# Patient Record
Sex: Female | Born: 1975 | Race: Black or African American | Hispanic: No
Health system: Midwestern US, Community
[De-identification: ages and names within clinical notes are randomized; demographics above are authoritative.]

## PROBLEM LIST (undated history)

## (undated) DIAGNOSIS — R51 Headache: Secondary | ICD-10-CM

## (undated) DIAGNOSIS — F419 Anxiety disorder, unspecified: Secondary | ICD-10-CM

## (undated) DIAGNOSIS — N83209 Unspecified ovarian cyst, unspecified side: Secondary | ICD-10-CM

## (undated) DIAGNOSIS — G5600 Carpal tunnel syndrome, unspecified upper limb: Secondary | ICD-10-CM

## (undated) DIAGNOSIS — K219 Gastro-esophageal reflux disease without esophagitis: Secondary | ICD-10-CM

## (undated) DIAGNOSIS — I10 Essential (primary) hypertension: Secondary | ICD-10-CM

## (undated) DIAGNOSIS — M9511 Cauliflower ear, right ear: Secondary | ICD-10-CM

## (undated) DIAGNOSIS — N63 Unspecified lump in unspecified breast: Secondary | ICD-10-CM

## (undated) DIAGNOSIS — R519 Headache, unspecified: Secondary | ICD-10-CM

## (undated) DIAGNOSIS — I82412 Acute embolism and thrombosis of left femoral vein: Secondary | ICD-10-CM

## (undated) DIAGNOSIS — R5383 Other fatigue: Secondary | ICD-10-CM

## (undated) DIAGNOSIS — S86811A Strain of other muscle(s) and tendon(s) at lower leg level, right leg, initial encounter: Principal | ICD-10-CM

## (undated) HISTORY — DX: Cauliflower ear, right ear: M95.11

## (undated) HISTORY — DX: Anxiety disorder, unspecified: F41.9

## (undated) HISTORY — PX: NO PAST SURGERIES: SHX2092

## (undated) HISTORY — DX: Essential (primary) hypertension: I10

## (undated) HISTORY — PX: AMPUTATION FINGER: SHX6594

## (undated) HISTORY — DX: Gastro-esophageal reflux disease without esophagitis: K21.9

## (undated) HISTORY — DX: Unspecified ovarian cyst, unspecified side: N83.209

## (undated) HISTORY — DX: Headache, unspecified: R51.9

## (undated) HISTORY — DX: Unspecified lump in unspecified breast: N63.0

## (undated) HISTORY — PX: AMPUTATION TOE: SHX6595

## (undated) HISTORY — DX: Carpal tunnel syndrome, unspecified upper limb: G56.00

## (undated) HISTORY — DX: Headache: R51

## (undated) HISTORY — DX: Acute embolism and thrombosis of left femoral vein: I82.412

---

## 2003-09-25 ENCOUNTER — Emergency Department (HOSPITAL_COMMUNITY): Admission: EM | Admit: 2003-09-25 | Discharge: 2003-09-25 | Payer: Self-pay | Admitting: Emergency Medicine

## 2012-09-07 ENCOUNTER — Ambulatory Visit: Payer: Self-pay | Admitting: Internal Medicine

## 2012-10-03 DIAGNOSIS — Z809 Family history of malignant neoplasm, unspecified: Secondary | ICD-10-CM | POA: Insufficient documentation

## 2012-10-25 ENCOUNTER — Emergency Department: Payer: Self-pay | Admitting: Emergency Medicine

## 2012-10-25 LAB — URINALYSIS, COMPLETE
Blood: NEGATIVE
Glucose,UR: NEGATIVE mg/dL (ref 0–75)
Nitrite: NEGATIVE
Ph: 8 (ref 4.5–8.0)
Protein: NEGATIVE
Specific Gravity: 1.017 (ref 1.003–1.030)
WBC UR: 4 /HPF (ref 0–5)

## 2012-10-25 LAB — COMPREHENSIVE METABOLIC PANEL
Alkaline Phosphatase: 87 U/L (ref 50–136)
Anion Gap: 7 (ref 7–16)
BUN: 13 mg/dL (ref 7–18)
Creatinine: 0.62 mg/dL (ref 0.60–1.30)
EGFR (African American): >60
EGFR (Non-African Amer.): >60
Glucose: 100 mg/dL — ABNORMAL HIGH (ref 65–99)
Potassium: 3.3 mmol/L — ABNORMAL LOW (ref 3.5–5.1)
SGOT(AST): 21 U/L (ref 15–37)
SGPT (ALT): 29 U/L (ref 12–78)
Sodium: 138 mmol/L (ref 136–145)
Total Protein: 7.9 g/dL (ref 6.4–8.2)

## 2012-10-25 LAB — CBC
HCT: 39.3 % (ref 35.0–47.0)
HGB: 12.8 g/dL (ref 12.0–16.0)
MCH: 29.3 pg (ref 26.0–34.0)
MCV: 90 fL (ref 80–100)
Platelet: 175 10*3/uL (ref 150–440)
RBC: 4.37 10*6/uL (ref 3.80–5.20)
WBC: 6.4 10*3/uL (ref 3.6–11.0)

## 2013-06-09 ENCOUNTER — Emergency Department: Payer: Self-pay | Admitting: Emergency Medicine

## 2014-01-03 DIAGNOSIS — G4489 Other headache syndrome: Secondary | ICD-10-CM | POA: Insufficient documentation

## 2014-01-24 ENCOUNTER — Ambulatory Visit: Payer: Self-pay | Admitting: Nurse Practitioner

## 2015-03-03 ENCOUNTER — Emergency Department
Admit: 2015-03-03 | Disposition: A | Discharge status: Home / Self Care | Payer: Self-pay | Admitting: Emergency Medicine

## 2015-07-31 ENCOUNTER — Emergency Department: Payer: No Typology Code available for payment source

## 2015-07-31 ENCOUNTER — Emergency Department
Admission: EM | Admit: 2015-07-31 | Discharge: 2015-07-31 | Disposition: A | Discharge status: Home / Self Care | Payer: No Typology Code available for payment source | Attending: Emergency Medicine | Admitting: Emergency Medicine

## 2015-07-31 ENCOUNTER — Encounter: Payer: Self-pay | Admitting: Emergency Medicine

## 2015-07-31 DIAGNOSIS — Z3202 Encounter for pregnancy test, result negative: Secondary | ICD-10-CM | POA: Diagnosis not present

## 2015-07-31 DIAGNOSIS — S40011A Contusion of right shoulder, initial encounter: Secondary | ICD-10-CM | POA: Insufficient documentation

## 2015-07-31 DIAGNOSIS — S3992XA Unspecified injury of lower back, initial encounter: Secondary | ICD-10-CM | POA: Insufficient documentation

## 2015-07-31 DIAGNOSIS — S4991XA Unspecified injury of right shoulder and upper arm, initial encounter: Secondary | ICD-10-CM | POA: Diagnosis present

## 2015-07-31 DIAGNOSIS — Y998 Other external cause status: Secondary | ICD-10-CM | POA: Diagnosis not present

## 2015-07-31 DIAGNOSIS — M545 Low back pain, unspecified: Secondary | ICD-10-CM

## 2015-07-31 DIAGNOSIS — Y92410 Unspecified street and highway as the place of occurrence of the external cause: Secondary | ICD-10-CM | POA: Insufficient documentation

## 2015-07-31 DIAGNOSIS — Y9389 Activity, other specified: Secondary | ICD-10-CM | POA: Diagnosis not present

## 2015-07-31 DIAGNOSIS — G8911 Acute pain due to trauma: Secondary | ICD-10-CM

## 2015-07-31 LAB — POCT PREGNANCY, URINE: Preg Test, Ur: NEGATIVE

## 2015-07-31 MED ORDER — CYCLOBENZAPRINE HCL 5 MG PO TABS: 5.0000 mg | ORAL_TABLET | Freq: Three times a day (TID) | ORAL | Status: DC | PRN

## 2015-07-31 MED ORDER — IBUPROFEN 800 MG PO TABS: 800.0000 mg | ORAL_TABLET | Freq: Three times a day (TID) | ORAL | Status: DC | PRN
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
## 2015-08-05 ENCOUNTER — Emergency Department
Admission: EM | Admit: 2015-08-05 | Discharge: 2015-08-05 | Disposition: A | Discharge status: Home / Self Care | Payer: No Typology Code available for payment source | Attending: Emergency Medicine | Admitting: Emergency Medicine

## 2015-08-05 ENCOUNTER — Encounter: Payer: Self-pay | Admitting: Emergency Medicine

## 2015-08-05 DIAGNOSIS — S4991XD Unspecified injury of right shoulder and upper arm, subsequent encounter: Secondary | ICD-10-CM | POA: Insufficient documentation

## 2015-08-05 DIAGNOSIS — M25511 Pain in right shoulder: Secondary | ICD-10-CM

## 2015-08-05 MED ORDER — NAPROXEN 500 MG PO TBEC: 500.0000 mg | DELAYED_RELEASE_TABLET | Freq: Two times a day (BID) | ORAL | Status: DC

## 2015-08-05 NOTE — Discharge Instructions (Signed)
Shoulder Pain The shoulder is the joint that connects your arms to your body. The bones that form the shoulder joint include the upper arm bone (humerus), the shoulder blade (scapula), and the collarbone (clavicle). The top of the humerus is shaped like a ball and fits into a rather flat socket on the scapula (glenoid cavity). A combination of muscles and strong, fibrous tissues that connect muscles to bones (tendons) support your shoulder joint and hold the ball in the socket. Small, fluid-filled sacs (bursae) are located in different areas of the joint. They act as cushions between the bones and the overlying soft tissues and help reduce friction between the gliding tendons and the bone as you move your arm. Your shoulder joint allows a wide range of motion in your arm. This range of motion allows you to do things like scratch your back or throw a ball. However, this range of motion also makes your shoulder more prone to pain from overuse and injury. Causes of shoulder pain can originate from both injury and overuse and usually can be grouped in the following four categories:  Redness, swelling, and pain (inflammation) of the tendon (tendinitis) or the bursae (bursitis).  Instability, such as a dislocation of the joint.  Inflammation of the joint (arthritis).  Broken bone (fracture). HOME CARE INSTRUCTIONS   Apply ice to the sore area.  Put ice in a plastic bag.  Place a towel between your skin and the bag.  Leave the ice on for 15-20 minutes, 3-4 times per day for the first 2 days, or as directed by your health care provider.  Stop using cold packs if they do not help with the pain.  If you have a shoulder sling or immobilizer, wear it as long as your caregiver instructs. Only remove it to shower or bathe. Move your arm as little as possible, but keep your hand moving to prevent swelling.  Squeeze a soft ball or foam pad as much as possible to help prevent swelling.  Only take  over-the-counter or prescription medicines for pain, discomfort, or fever as directed by your caregiver. SEEK MEDICAL CARE IF:   Your shoulder pain increases, or new pain develops in your arm, hand, or fingers.  Your hand or fingers become cold and numb.  Your pain is not relieved with medicines. SEEK IMMEDIATE MEDICAL CARE IF:   Your arm, hand, or fingers are numb or tingling.  Your arm, hand, or fingers are significantly swollen or turn white or blue. MAKE SURE YOU:   Understand these instructions.  Will watch your condition.  Will get help right away if you are not doing well or get worse. Document Released: 08/17/2005 Document Revised: 03/24/2014 Document Reviewed: 10/22/2011 [facility] Patient Information 2015 [place], [place]. This information is not intended to replace advice given to you by your health care provider. Make sure you discuss any questions you have with your health care provider.  Take the prescription Naprosyn in place of the previously prescribed ibuprofen. Continue with the Flexeril as needed for muscle spasm. Apply ice to the shoulder to reduce pain and inflammation. Follow with Dr. Sabra Heck for ongoing symptoms.
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
## 2015-09-11 ENCOUNTER — Emergency Department
Admission: EM | Admit: 2015-09-11 | Discharge: 2015-09-11 | Disposition: A | Discharge status: Home / Self Care | Payer: Self-pay | Attending: Student | Admitting: Student

## 2015-09-11 ENCOUNTER — Encounter: Payer: Self-pay | Admitting: Emergency Medicine

## 2015-09-11 DIAGNOSIS — Z791 Long term (current) use of non-steroidal anti-inflammatories (NSAID): Secondary | ICD-10-CM | POA: Insufficient documentation

## 2015-09-11 DIAGNOSIS — M5416 Radiculopathy, lumbar region: Secondary | ICD-10-CM | POA: Insufficient documentation

## 2015-09-11 MED ORDER — CYCLOBENZAPRINE HCL 5 MG PO TABS: 5.0000 mg | ORAL_TABLET | Freq: Three times a day (TID) | ORAL | Status: DC | PRN

## 2015-09-11 MED ORDER — TRAMADOL HCL 50 MG PO TABS: 50.0000 mg | ORAL_TABLET | Freq: Four times a day (QID) | ORAL | Status: DC | PRN

## 2015-09-11 MED ORDER — MELOXICAM 15 MG PO TABS: 15.0000 mg | ORAL_TABLET | Freq: Every day | ORAL | Status: DC

## 2015-09-11 NOTE — Discharge Instructions (Signed)
Back Exercises If you have pain in your back, do these exercises 2-3 times each day or as told by your doctor. When the pain goes away, do the exercises once each day, but repeat the steps more times for each exercise (do more repetitions). If you do not have pain in your back, do these exercises once each day or as told by your doctor. EXERCISES Single Knee to Chest Do these steps 3-5 times in a row for each leg:  Lie on your back on a firm bed or the floor with your legs stretched out.  Bring one knee to your chest.  Hold your knee to your chest by grabbing your knee or thigh.  Pull on your knee until you feel a gentle stretch in your lower back.  Keep doing the stretch for 10-30 seconds.  Slowly let go of your leg and straighten it. Pelvic Tilt Do these steps 5-10 times in a row:  Lie on your back on a firm bed or the floor with your legs stretched out.  Bend your knees so they point up to the ceiling. Your feet should be flat on the floor.  Tighten your lower belly (abdomen) muscles to press your lower back against the floor. This will make your tailbone point up to the ceiling instead of pointing down to your feet or the floor.  Stay in this position for 5-10 seconds while you gently tighten your muscles and breathe evenly. Cat-Cow Do these steps until your lower back bends more easily:  Get on your hands and knees on a firm surface. Keep your hands under your shoulders, and keep your knees under your hips. You may put padding under your knees.  Let your head hang down, and make your tailbone point down to the floor so your lower back is round like the back of a cat.  Stay in this position for 5 seconds.  Slowly lift your head and make your tailbone point up to the ceiling so your back hangs low (sags) like the back of a cow.  Stay in this position for 5 seconds. Press-Ups Do these steps 5-10 times in a row:  Lie on your belly (face-down) on the floor.  Place your  hands near your head, about shoulder-width apart.  While you keep your back relaxed and keep your hips on the floor, slowly straighten your arms to raise the top half of your body and lift your shoulders. Do not use your back muscles. To make yourself more comfortable, you may change where you place your hands.  Stay in this position for 5 seconds.  Slowly return to lying flat on the floor. Bridges Do these steps 10 times in a row:  Lie on your back on a firm surface.  Bend your knees so they point up to the ceiling. Your feet should be flat on the floor.  Tighten your butt muscles and lift your butt off of the floor until your waist is almost as high as your knees. If you do not feel the muscles working in your butt and the back of your thighs, slide your feet 1-2 inches farther away from your butt.  Stay in this position for 3-5 seconds.  Slowly lower your butt to the floor, and let your butt muscles relax. If this exercise is too easy, try doing it with your arms crossed over your chest. Belly Crunches Do these steps 5-10 times in a row:  Lie on your back on a firm bed  or the floor with your legs stretched out.  Bend your knees so they point up to the ceiling. Your feet should be flat on the floor.  Cross your arms over your chest.  Tip your chin a little bit toward your chest but do not bend your neck.  Tighten your belly muscles and slowly raise your chest just enough to lift your shoulder blades a tiny bit off of the floor.  Slowly lower your chest and your head to the floor. Back Lifts Do these steps 5-10 times in a row:  Lie on your belly (face-down) with your arms at your sides, and rest your forehead on the floor.  Tighten the muscles in your legs and your butt.  Slowly lift your chest off of the floor while you keep your hips on the floor. Keep the back of your head in line with the curve in your back. Look at the floor while you do this.  Stay in this position  for 3-5 seconds.  Slowly lower your chest and your face to the floor. GET HELP IF:  Your back pain gets a lot worse when you do an exercise.  Your back pain does not lessen 2 hours after you exercise. If you have any of these problems, stop doing the exercises. Do not do them again unless your doctor says it is okay. GET HELP RIGHT AWAY IF:  You have sudden, very bad back pain. If this happens, stop doing the exercises. Do not do them again unless your doctor says it is okay.   This information is not intended to replace advice given to you by your health care provider. Make sure you discuss any questions you have with your health care provider.   Document Released: 12/10/2010 Document Revised: 07/29/2015 Document Reviewed: 01/01/2015 Elsevier Interactive Patient Education 2016 [employer]  Lumbosacral Radiculopathy Lumbosacral radiculopathy is a condition that involves the spinal nerves and nerve roots in the low back and bottom of the spine. The condition develops when these nerves and nerve roots move out of place or become inflamed and cause symptoms. CAUSES This condition may be caused by:  Pressure from a disk that bulges out of place (herniated disk). A disk is a plate of cartilage that separates bones in the spine.  Disk degeneration.  A narrowing of the bones of the lower back (spinal stenosis).  A tumor.  An infection.  An injury that places sudden pressure on the disks that cushion the bones of your lower spine. RISK FACTORS This condition is more likely to develop in:  Males aged 30-50 years.  Females aged 10-60 years.  People who lift improperly.  People who are overweight or live a sedentary lifestyle.  People who smoke.  People who perform repetitive activities that strain the spine. SYMPTOMS Symptoms of this condition include:  Pain that goes down from the back into the legs (sciatica). This is the most common symptom. The pain may be worse with  sitting, coughing, or sneezing.  Pain and numbness in the arms and legs.  Muscle weakness.  Tingling.  Loss of bladder control or bowel control. DIAGNOSIS This condition is diagnosed with a physical exam and medical history. If the pain is lasting, you may have tests, such as:  MRI scan.  X-ray.  CT scan.  Myelogram.  Nerve conduction study. TREATMENT This condition is often treated with:  Hot packs and ice applied to affected areas.  Stretches to improve flexibility.  Exercises to strengthen back muscles.  Physical therapy.  Pain medicine.  A steroid injection in the spine. In some cases, no treatment is needed. If the condition is long-lasting (chronic), or if symptoms are severe, treatment may involve surgery or lifestyle changes, such as following a weight loss plan. HOME CARE INSTRUCTIONS Medicines  Take medicines only as directed by your health care provider.  Do not drive or operate heavy machinery while taking pain medicine. Injury Care  Apply a heat pack to the injured area as directed by your health care provider.  Apply ice to the affected area:  Put ice in a plastic bag.  Place a towel between your skin and the bag.  Leave the ice on for 20-30 minutes, every 2 hours while you are awake or as needed. Or, leave the ice on for as long as directed by your health care provider. Other Instructions  If you were shown how to do any exercises or stretches, do them as directed by your health care provider.  If your health care provider prescribed a diet or exercise program, follow it as directed.  Keep all follow-up visits as directed by your health care provider. This is important. SEEK MEDICAL CARE IF:  Your pain does not improve over time even when taking pain medicines. SEEK IMMEDIATE MEDICAL CARE IF:  Your develop severe pain.  Your pain suddenly gets worse.  You develop increasing weakness in your legs.  You lose the ability to control  your bladder or bowel.  You have difficulty walking or balancing.  You have a fever.   This information is not intended to replace advice given to you by your health care provider. Make sure you discuss any questions you have with your health care provider.   Document Released: 11/07/2005 Document Revised: 03/24/2015 Document Reviewed: 11/03/2014 Elsevier Interactive Patient Education [employer].

## 2015-09-11 NOTE — ED Notes (Signed)
C/o right hip pain, describes pain as "burning", denies any injury

## 2015-09-11 NOTE — ED Provider Notes (Signed)
CSN: [id number]     Arrival date & time 09/11/15  1631 History   First MD Initiated Contact with Patient 09/11/15 1707     Chief Complaint  Patient presents with  . Hip Pain     (Consider location/radiation/quality/duration/timing/severity/associated sxs/prior Treatment) HPI  For evaluation of lower back pain. Pain is been present for 3 days. No trauma or injury. She points to her right lateral hip and buttocks and states she has had a sharp shooting burning pain going down her right lateral hip for the last 3 days. He is increased with movement and activity. She denies any abdominal pelvic pain or urinary symptoms. She has not been taking any medications for pain. Pain is currently 7 out of 10. No weakness or loss of bowel or bladder symptoms.  History reviewed. No pertinent past medical history. History reviewed. No pertinent past surgical history. No family history on file. Social History  Substance Use Topics  . Smoking status: Never Smoker   . Smokeless tobacco: None  . Alcohol Use: No   OB History    No data available     Review of Systems  Constitutional: Negative for fever, chills, activity change and fatigue.  HENT: Negative for congestion, sinus pressure and sore throat.   Eyes: Negative for visual disturbance.  Respiratory: Negative for cough, chest tightness and shortness of breath.   Cardiovascular: Negative for chest pain and leg swelling.  Gastrointestinal: Negative for nausea, vomiting, abdominal pain and diarrhea.  Genitourinary: Negative for dysuria.  Musculoskeletal: Positive for back pain. Negative for arthralgias and gait problem.  Skin: Negative for rash.  Neurological: Negative for weakness, numbness and headaches.  Hematological: Negative for adenopathy.  Psychiatric/Behavioral: Negative for behavioral problems, confusion and agitation.      Allergies  Review of patient's allergies indicates no known allergies.  Home Medications   Prior to  Admission medications   Medication Sig Start Date End Date Taking? Authorizing Provider  cyclobenzaprine (FLEXERIL) 5 MG tablet Take 1 tablet (5 mg total) by mouth every 8 (eight) hours as needed for muscle spasms. 09/11/15   Duanne Guess, PA-C  cyclobenzaprine (FLEXERIL) 5 MG tablet Take 1 tablet (5 mg total) by mouth every 8 (eight) hours as needed for muscle spasms. 09/11/15   Duanne Guess, PA-C  ibuprofen (ADVIL,MOTRIN) 800 MG tablet Take 1 tablet (800 mg total) by mouth every 8 (eight) hours as needed. 07/31/15   Pierce Crane Beers, PA-C  meloxicam (MOBIC) 15 MG tablet Take 1 tablet (15 mg total) by mouth daily. 09/11/15   Duanne Guess, PA-C  naproxen (EC NAPROSYN) 500 MG EC tablet Take 1 tablet (500 mg total) by mouth 2 (two) times daily with a meal. 08/05/15   Jenise V Bacon Menshew, PA-C  traMADol (ULTRAM) 50 MG tablet Take 1 tablet (50 mg total) by mouth every 6 (six) hours as needed. 09/11/15   Duanne Guess, PA-C   BP 132/74 mmHg  Pulse 111  Temp(Src) 98.8 F (37.1 C) (Oral)  Resp 18  Ht 4\' 11"  (1.499 m)  Wt 160 lb (72.576 kg)  BMI 32.30 kg/m2  SpO2 98%  LMP  (LMP Unknown) Physical Exam  Constitutional: She is oriented to person, place, and time. She appears well-developed and well-nourished. No distress.  HENT:  Head: Normocephalic and atraumatic.  Mouth/Throat: Oropharynx is clear and moist.  Eyes: EOM are normal. Pupils are equal, round, and reactive to light. Right eye exhibits no discharge. Left eye exhibits no discharge.  Neck: Normal range of motion. Neck supple.  Cardiovascular: Normal rate, regular rhythm and intact distal pulses.   Pulmonary/Chest: Effort normal and breath sounds normal. No respiratory distress. She exhibits no tenderness.  Abdominal: Soft. She exhibits no distension. There is no tenderness.  Musculoskeletal:  Examination of the lumbar spine shows patient has no spinous process tenderness. There is mild right paravertebral muscle tenderness.  She is tender in the right SI joint and right sciatic notch. She has full flexion and extension of the lumbar spine with no discomfort. She has full range of motion of the right hip with internal and external rotation. She is able to stand with no antalgic component. She has full strength throughout the right lower extremity with normal patellar and Achilles reflexes. No clonus noted. Positive straight leg raise on the right.  Neurological: She is alert and oriented to person, place, and time. She has normal reflexes.  Skin: Skin is warm and dry.  Psychiatric: She has a normal mood and affect. Her behavior is normal. Thought content normal.    ED Course  Procedures (including critical care time) Labs Review Labs Reviewed - No data to display  Imaging Review No results found. I have personally reviewed and evaluated these images and lab results as part of my medical decision-making.   EKG Interpretation None      MDM   Final diagnoses:  Right lumbar radiculopathy    39 year old female with 3 day history of right sided lumbar radiculitis. No trauma or injury. No spinous process tenderness. Patient is treated for lumbar radiculopathy with meloxicam, Lexapro, tramadol. She will follow-up with orthopedics if no improvement. Return to the ER for any worsening symptoms urgent changes in health.    Duanne Guess, PA-C 09/11/15 1717  Joanne Gavel, MD 09/12/15 [phone]

## 2015-10-02 ENCOUNTER — Encounter: Payer: Self-pay | Admitting: Emergency Medicine

## 2015-10-02 ENCOUNTER — Emergency Department
Admission: EM | Admit: 2015-10-02 | Discharge: 2015-10-02 | Disposition: A | Discharge status: Home / Self Care | Payer: Self-pay | Attending: Emergency Medicine | Admitting: Emergency Medicine

## 2015-10-02 DIAGNOSIS — W228XXA Striking against or struck by other objects, initial encounter: Secondary | ICD-10-CM | POA: Insufficient documentation

## 2015-10-02 DIAGNOSIS — H6091 Unspecified otitis externa, right ear: Secondary | ICD-10-CM | POA: Insufficient documentation

## 2015-10-02 DIAGNOSIS — S00431A Contusion of right ear, initial encounter: Secondary | ICD-10-CM | POA: Insufficient documentation

## 2015-10-02 DIAGNOSIS — Z791 Long term (current) use of non-steroidal anti-inflammatories (NSAID): Secondary | ICD-10-CM | POA: Insufficient documentation

## 2015-10-02 DIAGNOSIS — Y998 Other external cause status: Secondary | ICD-10-CM | POA: Insufficient documentation

## 2015-10-02 DIAGNOSIS — Y9389 Activity, other specified: Secondary | ICD-10-CM | POA: Insufficient documentation

## 2015-10-02 DIAGNOSIS — Y9289 Other specified places as the place of occurrence of the external cause: Secondary | ICD-10-CM | POA: Insufficient documentation

## 2015-10-02 LAB — GLUCOSE, CAPILLARY: GLUCOSE-CAPILLARY: 123 mg/dL — AB (ref 65–99)

## 2015-10-02 MED ORDER — CEPHALEXIN 500 MG PO CAPS: 500.0000 mg | ORAL_CAPSULE | Freq: Four times a day (QID) | ORAL | Status: AC

## 2015-10-02 MED ORDER — NEOMYCIN-COLIST-HC-THONZONIUM 3.3-3-10-0.5 MG/ML OT SUSP: 4.0000 [drp] | Freq: Once | OTIC | Status: DC

## 2015-10-02 MED ORDER — NEOMYCIN-POLYMYXIN-HC 1 % OT SOLN
4.0000 [drp] | OTIC | Status: AC
  Administered 2015-10-02: 4 [drp] via OTIC
  Filled 2015-10-02: qty 10

## 2015-10-02 NOTE — ED Notes (Signed)
Pt verbalizes understanding of discharge instructions. Pt and husband have all personal belongings and reports feeling able to dress ear tomorrow.

## 2015-10-02 NOTE — ED Notes (Signed)
Pt left before prescriptions could be taken into room and signature could be obtained and vitals could not be updated. Pt was aware that prescription was on the way but left ED anyway. Prescription placed at front deck in case pt comes back. Pt was seen ambulating without difficulty and with items to dress ear with tomarrow.

## 2015-10-02 NOTE — Discharge Instructions (Signed)
Otitis Externa Otitis externa is a bacterial or fungal infection of the outer ear canal. This is the area from the eardrum to the outside of the ear. Otitis externa is sometimes called "swimmer's ear." CAUSES  Possible causes of infection include:  Swimming in dirty water.  Moisture remaining in the ear after swimming or bathing.  Mild injury (trauma) to the ear.  Objects stuck in the ear (foreign body).  Cuts or scrapes (abrasions) on the outside of the ear. SIGNS AND SYMPTOMS  The first symptom of infection is often itching in the ear canal. Later signs and symptoms may include swelling and redness of the ear canal, ear pain, and yellowish-white fluid (pus) coming from the ear. The ear pain may be worse when pulling on the earlobe. DIAGNOSIS  Your health care provider will perform a physical exam. A sample of fluid may be taken from the ear and examined for bacteria or fungi. TREATMENT  Antibiotic ear drops are often given for 10 to 14 days. Treatment may also include pain medicine or corticosteroids to reduce itching and swelling. HOME CARE INSTRUCTIONS   Apply antibiotic ear drops to the ear canal as prescribed by your health care provider.  Take medicines only as directed by your health care provider.  If you have diabetes, follow any additional treatment instructions from your health care provider.  Keep all follow-up visits as directed by your health care provider. PREVENTION   Keep your ear dry. Use the corner of a towel to absorb water out of the ear canal after swimming or bathing.  Avoid scratching or putting objects inside your ear. This can damage the ear canal or remove the protective wax that lines the canal. This makes it easier for bacteria and fungi to grow.  Avoid swimming in lakes, polluted water, or poorly chlorinated pools.  You may use ear drops made of rubbing alcohol and vinegar after swimming. Combine equal parts of white vinegar and alcohol in a bottle.  Put 3 or 4 drops into each ear after swimming. SEEK MEDICAL CARE IF:   You have a fever.  Your ear is still red, swollen, painful, or draining pus after 3 days.  Your redness, swelling, or pain gets worse.  You have a severe headache.  You have redness, swelling, pain, or tenderness in the area behind your ear. MAKE SURE YOU:   Understand these instructions.  Will watch your condition.  Will get help right away if you are not doing well or get worse.   This information is not intended to replace advice given to you by your health care provider. Make sure you discuss any questions you have with your health care provider.   Document Released: 11/07/2005 Document Revised: 11/28/2014 Document Reviewed: 11/24/2011 Elsevier Interactive Patient Education 2016 [employer]  Hematoma A hematoma is a collection of blood under the skin, in an organ, in a body space, in a joint space, or in other tissue. The blood can clot to form a lump that you can see and feel. The lump is often firm and may sometimes become sore and tender. Most hematomas get better in a few days to weeks. However, some hematomas may be serious and require medical care. Hematomas can range in size from very small to very large. CAUSES  A hematoma can be caused by a blunt or penetrating injury. It can also be caused by spontaneous leakage from a blood vessel under the skin. Spontaneous leakage from a blood vessel is more likely  to occur in older people, especially those taking blood thinners. Sometimes, a hematoma can develop after certain medical procedures. SIGNS AND SYMPTOMS   A firm lump on the body.  Possible pain and tenderness in the area.  Bruising.Blue, dark blue, purple-red, or yellowish skin may appear at the site of the hematoma if the hematoma is close to the surface of the skin. For hematomas in deeper tissues or body spaces, the signs and symptoms may be subtle. For example, an intra-abdominal hematoma  may cause abdominal pain, weakness, fainting, and shortness of breath. An intracranial hematoma may cause a headache or symptoms such as weakness, trouble speaking, or a change in consciousness. DIAGNOSIS  A hematoma can usually be diagnosed based on your medical history and a physical exam. Imaging tests may be needed if your health care provider suspects a hematoma in deeper tissues or body spaces, such as the abdomen, head, or chest. These tests may include ultrasonography or a CT scan.  TREATMENT  Hematomas usually go away on their own over time. Rarely does the blood need to be drained out of the body. Large hematomas or those that may affect vital organs will sometimes need surgical drainage or monitoring. HOME CARE INSTRUCTIONS   Apply ice to the injured area:   Put ice in a plastic bag.   Place a towel between your skin and the bag.   Leave the ice on for 20 minutes, 2-3 times a day for the first 1 to 2 days.   After the first 2 days, switch to using warm compresses on the hematoma.   Elevate the injured area to help decrease pain and swelling. Wrapping the area with an elastic bandage may also be helpful. Compression helps to reduce swelling and promotes shrinking of the hematoma. Make sure the bandage is not wrapped too tight.   If your hematoma is on a lower extremity and is painful, crutches may be helpful for a couple days.   Only take over-the-counter or prescription medicines as directed by your health care provider. SEEK IMMEDIATE MEDICAL CARE IF:   You have increasing pain, or your pain is not controlled with medicine.   You have a fever.   You have worsening swelling or discoloration.   Your skin over the hematoma breaks or starts bleeding.   Your hematoma is in your chest or abdomen and you have weakness, shortness of breath, or a change in consciousness.  Your hematoma is on your scalp (caused by a fall or injury) and you have a worsening headache or a  change in alertness or consciousness. MAKE SURE YOU:   Understand these instructions.  Will watch your condition.  Will get help right away if you are not doing well or get worse.   This information is not intended to replace advice given to you by your health care provider. Make sure you discuss any questions you have with your health care provider.   Document Released: 06/21/2004 Document Revised: 07/10/2013 Document Reviewed: 04/17/2013 Elsevier Interactive Patient Education [employer].  Please return immediately if condition worsens. Please contact her primary physician or the physician you were given for referral. If you have any specialist physicians involved in her treatment and plan please also contact them. Thank you for using [facility] [facility]. Please take over-the-counter pain medication and contact the your nose and throat physician for follow-up concerning the infection in her right ureter along with the swelling and bruising that she have on the outside part  of your ear.
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
## 2016-05-18 ENCOUNTER — Emergency Department
Admission: EM | Admit: 2016-05-18 | Discharge: 2016-05-18 | Disposition: A | Discharge status: Home / Self Care | Payer: No Typology Code available for payment source | Attending: Emergency Medicine | Admitting: Emergency Medicine

## 2016-05-18 DIAGNOSIS — N390 Urinary tract infection, site not specified: Secondary | ICD-10-CM | POA: Insufficient documentation

## 2016-05-18 LAB — URINALYSIS COMPLETE WITH MICROSCOPIC (ARMC ONLY)
Bilirubin Urine: NEGATIVE
GLUCOSE, UA: NEGATIVE mg/dL
NITRITE: NEGATIVE
Protein, ur: 30 mg/dL — AB
SPECIFIC GRAVITY, URINE: 1.014 (ref 1.005–1.030)
pH: 5 (ref 5.0–8.0)

## 2016-05-18 LAB — POCT PREGNANCY, URINE: Preg Test, Ur: NEGATIVE

## 2016-05-18 MED ORDER — SULFAMETHOXAZOLE-TRIMETHOPRIM 800-160 MG PO TABS: 1.0000 | ORAL_TABLET | Freq: Two times a day (BID) | ORAL | Status: DC

## 2016-05-18 MED ORDER — IBUPROFEN 600 MG PO TABS: 600.0000 mg | ORAL_TABLET | Freq: Three times a day (TID) | ORAL | Status: DC | PRN

## 2016-05-18 NOTE — ED Provider Notes (Signed)
[facility] Emergency Department Provider Note   ____________________________________________  Time seen: Approximately 12:00 PM  I have reviewed the triage vital signs and the nursing notes.   HISTORY  Chief Complaint Hip Pain    HPI Alexandra Montes is a 40 y.o. female is here with complaint of right hip pain that radiates down her right leg for 2-3 days.. Patient states that it is worse with lying down. She denies any recent injury and has not taken any over-the-counter medication. Patient is continue to ambulate without any assistance. Currently she denies any urinary symptoms. She denies any incontinence of bowel or bladder. Currently she rates her pain as 7/10.   History reviewed. No pertinent past medical history.  There are no active problems to display for this patient.   History reviewed. No pertinent past surgical history.  Current Outpatient Rx  Name  Route  Sig  Dispense  Refill  . cyclobenzaprine (FLEXERIL) 5 MG tablet   Oral   Take 1 tablet (5 mg total) by mouth every 8 (eight) hours as needed for muscle spasms.   30 tablet   1   . cyclobenzaprine (FLEXERIL) 5 MG tablet   Oral   Take 1 tablet (5 mg total) by mouth every 8 (eight) hours as needed for muscle spasms.   30 tablet   0   . ibuprofen (ADVIL,MOTRIN) 600 MG tablet   Oral   Take 1 tablet (600 mg total) by mouth every 8 (eight) hours as needed.   30 tablet   0   . sulfamethoxazole-trimethoprim (BACTRIM DS,SEPTRA DS) 800-160 MG tablet   Oral   Take 1 tablet by mouth 2 (two) times daily.   20 tablet   0     Allergies Review of patient's allergies indicates no known allergies.  No family history on file.  Social History Social History  Substance Use Topics  . Smoking status: Never Smoker   . Smokeless tobacco: None  . Alcohol Use: No    Review of Systems Constitutional: No fever/chills Cardiovascular: Denies chest pain. Respiratory: Denies shortness of  breath. Gastrointestinal: No abdominal pain.  No nausea, no vomiting.   Genitourinary: Negative for dysuria. Musculoskeletal: Negative for back pain, positive for right hip pain. Skin: Negative for rash. Neurological: Negative for headaches, focal weakness or numbness.  10-point ROS otherwise negative.  ____________________________________________   PHYSICAL EXAM:  VITAL SIGNS: ED Triage Vitals  Enc Vitals Group     BP --      Pulse --      Resp --      Temp --      Temp src --      SpO2 --      Weight --      Height --      Head Cir --      Peak Flow --      Pain Score --      Pain Loc --      Pain Edu? --      Excl. in [place]? --     Constitutional: Alert and oriented. Well appearing and in no acute distress. Eyes: Conjunctivae are normal. PERRL. EOMI. Head: Atraumatic. Nose: No congestion/rhinnorhea. Neck: No stridor.   Cardiovascular: Normal rate, regular rhythm. Grossly normal heart sounds.  Good peripheral circulation. Respiratory: Normal respiratory effort.  No retractions. Lungs CTAB. Gastrointestinal: Soft and nontender. No distention. Mild right CVA tenderness. Musculoskeletal: On exam when patient was asked to point where in her  right hip she is hurting she actually points to her mid back area. Range of motion is without restriction Neurologic:  Normal speech and language. No gross focal neurologic deficits are appreciated. No gait instability. Skin:  Skin is warm, dry and intact. No rash noted. Psychiatric: Mood and affect are normal. Speech and behavior are normal.  ____________________________________________   LABS (all labs ordered are listed, but only abnormal results are displayed)  Labs Reviewed  URINALYSIS COMPLETEWITH MICROSCOPIC ([place] ONLY) - Abnormal; Notable for the following:    Color, Urine YELLOW (*)    APPearance HAZY (*)    Ketones, ur TRACE (*)    Hgb urine dipstick 2+ (*)    Protein, ur 30 (*)    Leukocytes, UA 2+ (*)    Bacteria,  UA RARE (*)    Squamous Epithelial / LPF 0-5 (*)    All other components within normal limits  POC URINE PREG, ED  POCT PREGNANCY, URINE    PROCEDURES  Procedure(s) performed: None  Critical Care performed: No  ____________________________________________   INITIAL IMPRESSION / ASSESSMENT AND PLAN / ED COURSE  Pertinent labs & imaging results that were available during my care of the patient were reviewed by me and considered in my medical decision making (see chart for details).  Patient was told to follow-up with her primary care doctor if any continued urinary symptoms or hip pain. She was started on Bactrim DS twice a day for 10 days along with ibuprofen if needed for pain. Patient was encouraged to increase fluids. ____________________________________________   FINAL CLINICAL IMPRESSION(S) / ED DIAGNOSES  Final diagnoses:  Acute urinary tract infection      NEW MEDICATIONS STARTED DURING THIS VISIT:  New Prescriptions   IBUPROFEN (ADVIL,MOTRIN) 600 MG TABLET    Take 1 tablet (600 mg total) by mouth every 8 (eight) hours as needed.   SULFAMETHOXAZOLE-TRIMETHOPRIM (BACTRIM DS,SEPTRA DS) 800-160 MG TABLET    Take 1 tablet by mouth 2 (two) times daily.     Note:  This document was prepared using Dragon voice recognition software and may include unintentional dictation errors.    Johnn Hai, PA-C 05/18/16 1357  Johnn Hai, PA-C 05/18/16 1357  Earleen Newport, MD 05/18/16 [phone]

## 2016-05-18 NOTE — ED Notes (Signed)
Patient with right hip pain that radiates down leg. Worse when lying down.

## 2016-05-18 NOTE — Discharge Instructions (Signed)
Increase fluids. Follow-up with your primary care doctor at [facility] if any continued problems or urgent concerns. Begin taking Bactrim DS daily twice a day until completely finished. Ibuprofen as needed for pain.

## 2016-12-26 ENCOUNTER — Emergency Department: Payer: Self-pay

## 2016-12-26 ENCOUNTER — Emergency Department
Admission: EM | Admit: 2016-12-26 | Discharge: 2016-12-26 | Disposition: A | Discharge status: Home / Self Care | Payer: Self-pay | Attending: Emergency Medicine | Admitting: Emergency Medicine

## 2016-12-26 DIAGNOSIS — N83201 Unspecified ovarian cyst, right side: Secondary | ICD-10-CM | POA: Insufficient documentation

## 2016-12-26 DIAGNOSIS — R102 Pelvic and perineal pain: Secondary | ICD-10-CM

## 2016-12-26 DIAGNOSIS — D251 Intramural leiomyoma of uterus: Secondary | ICD-10-CM | POA: Insufficient documentation

## 2016-12-26 DIAGNOSIS — R1032 Left lower quadrant pain: Secondary | ICD-10-CM

## 2016-12-26 DIAGNOSIS — A549 Gonococcal infection, unspecified: Secondary | ICD-10-CM | POA: Insufficient documentation

## 2016-12-26 LAB — CBC WITH DIFFERENTIAL/PLATELET
BASOS PCT: 1 %
Basophils Absolute: 0 10*3/uL (ref 0–0.1)
EOS ABS: 0 10*3/uL (ref 0–0.7)
Eosinophils Relative: 1 %
HCT: 36.8 % (ref 35.0–47.0)
HEMOGLOBIN: 12.3 g/dL (ref 12.0–16.0)
Lymphocytes Relative: 44 %
Lymphs Abs: 2.1 10*3/uL (ref 1.0–3.6)
MCH: 30 pg (ref 26.0–34.0)
MCHC: 33.3 g/dL (ref 32.0–36.0)
MCV: 89.9 fL (ref 80.0–100.0)
MONOS PCT: 8 %
Monocytes Absolute: 0.4 10*3/uL (ref 0.2–0.9)
NEUTROS PCT: 46 %
Neutro Abs: 2.2 10*3/uL (ref 1.4–6.5)
PLATELETS: 173 10*3/uL (ref 150–440)
RBC: 4.09 MIL/uL (ref 3.80–5.20)
RDW: 13 % (ref 11.5–14.5)
WBC: 4.8 10*3/uL (ref 3.6–11.0)

## 2016-12-26 LAB — URINALYSIS, COMPLETE (UACMP) WITH MICROSCOPIC
BILIRUBIN URINE: NEGATIVE
Bacteria, UA: NONE SEEN
Glucose, UA: NEGATIVE mg/dL
HGB URINE DIPSTICK: NEGATIVE
KETONES UR: NEGATIVE mg/dL
Leukocytes, UA: NEGATIVE
NITRITE: NEGATIVE
PROTEIN: NEGATIVE mg/dL
RBC / HPF: NONE SEEN RBC/hpf (ref 0–5)
Specific Gravity, Urine: 1.025 (ref 1.005–1.030)
pH: 6 (ref 5.0–8.0)

## 2016-12-26 LAB — BASIC METABOLIC PANEL
Anion gap: 6 (ref 5–15)
BUN: 11 mg/dL (ref 6–20)
CALCIUM: 8.8 mg/dL — AB (ref 8.9–10.3)
CO2: 28 mmol/L (ref 22–32)
Chloride: 107 mmol/L (ref 101–111)
Creatinine, Ser: 0.67 mg/dL (ref 0.44–1.00)
GFR calc Af Amer: >60 mL/min (ref >60)
Glucose, Bld: 82 mg/dL (ref 65–99)
Potassium: 3.6 mmol/L (ref 3.5–5.1)
SODIUM: 141 mmol/L (ref 135–145)

## 2016-12-26 LAB — POCT PREGNANCY, URINE: Preg Test, Ur: NEGATIVE

## 2016-12-26 LAB — WET PREP, GENITAL
Clue Cells Wet Prep HPF POC: NONE SEEN
Sperm: NONE SEEN
Trich, Wet Prep: NONE SEEN
Yeast Wet Prep HPF POC: NONE SEEN

## 2016-12-26 LAB — CHLAMYDIA/NGC RT PCR (ARMC ONLY)
CHLAMYDIA TR: NOT DETECTED
N gonorrhoeae: DETECTED — AB

## 2016-12-26 LAB — HCG, QUANTITATIVE, PREGNANCY

## 2016-12-26 MED ORDER — HYDROCODONE-ACETAMINOPHEN 5-325 MG PO TABS
1.0000 | ORAL_TABLET | Freq: Once | ORAL | Status: AC
  Administered 2016-12-26: 1 via ORAL
  Filled 2016-12-26: qty 1

## 2016-12-26 MED ORDER — HYDROCODONE-ACETAMINOPHEN 5-325 MG PO TABS: 1.0000 | ORAL_TABLET | Freq: Three times a day (TID) | ORAL | 0 refills | Status: DC | PRN

## 2016-12-26 MED ORDER — CEFTRIAXONE SODIUM 250 MG IJ SOLR
250.0000 mg | Freq: Once | INTRAMUSCULAR | Status: AC
  Administered 2016-12-26: 250 mg via INTRAMUSCULAR
  Filled 2016-12-26: qty 250

## 2016-12-26 MED ORDER — LIDOCAINE HCL (PF) 1 % IJ SOLN
INTRAMUSCULAR | Status: AC
  Administered 2016-12-26: 1 mL
  Filled 2016-12-26: qty 5

## 2016-12-26 MED ORDER — ONDANSETRON 8 MG PO TBDP
8.0000 mg | ORAL_TABLET | Freq: Once | ORAL | Status: AC
  Administered 2016-12-26: 8 mg via ORAL
  Filled 2016-12-26: qty 1

## 2016-12-26 MED ORDER — AZITHROMYCIN 500 MG PO TABS
1000.0000 mg | ORAL_TABLET | Freq: Once | ORAL | Status: AC
  Administered 2016-12-26: 1000 mg via ORAL
  Filled 2016-12-26: qty 2

## 2016-12-26 NOTE — ED Provider Notes (Signed)
[facility] Emergency Department Provider Note ____________________________________________  Time seen: 1218  I have reviewed the triage vital signs and the nursing notes.  HISTORY  Chief Complaint  Abdominal Pain  HPI Alexandra Montes is a 41 y.o. female considered the ED for evaluation of left abdominal pain and nausea with onset yesterday. Patient denies any vomiting, diarrhea, chest pain, or shortness of breath. She describes onset of discomfort during sexual intercourse yesterday. She describes "hearing something pop," while engaged in intercourse. She denies any abnormal vaginal bleeding or vaginal discharge. She reports over the last 2 months her periods have been irregular in duration, lasting only 1-2 days. She denies any history of ovarian cysts, endometriosis, or uterine fibroids. She does comment that she and her boyfriend are trying to get pregnant.  History reviewed. No pertinent past medical history.  There are no active problems to display for this patient.  History reviewed. No pertinent surgical history.  Prior to Admission medications   Medication Sig Start Date End Date Taking? Authorizing Provider  cyclobenzaprine (FLEXERIL) 5 MG tablet Take 1 tablet (5 mg total) by mouth every 8 (eight) hours as needed for muscle spasms. 09/11/15   Duanne Guess, PA-C  cyclobenzaprine (FLEXERIL) 5 MG tablet Take 1 tablet (5 mg total) by mouth every 8 (eight) hours as needed for muscle spasms. 09/11/15   Duanne Guess, PA-C  HYDROcodone-acetaminophen (NORCO) 5-325 MG tablet Take 1 tablet by mouth 3 (three) times daily as needed. 12/26/16   Enora Trillo V Bacon Lennin Osmond, PA-C  ibuprofen (ADVIL,MOTRIN) 600 MG tablet Take 1 tablet (600 mg total) by mouth every 8 (eight) hours as needed. 05/18/16   Johnn Hai, PA-C  sulfamethoxazole-trimethoprim (BACTRIM DS,SEPTRA DS) 800-160 MG tablet Take 1 tablet by mouth 2 (two) times daily. 05/18/16   Johnn Hai, PA-C     Allergies Patient has no known allergies.  No family history on file.  Social History Social History  Substance Use Topics  . Smoking status: Never Smoker  . Smokeless tobacco: Never Used  . Alcohol use No    Review of Systems  Constitutional: Negative for fever. Cardiovascular: Negative for chest pain. Respiratory: Negative for shortness of breath. Gastrointestinal: Negative for abdominal pain, vomiting and diarrhea. Genitourinary: Negative for dysuria. Reports left pelvic pain.  Musculoskeletal: Negative for back pain. Skin: Negative for rash. Neurological: Negative for headaches, focal weakness or numbness. ____________________________________________  PHYSICAL EXAM:  VITAL SIGNS: ED Triage Vitals  Enc Vitals Group     BP 12/26/16 1157 132/75     Pulse Rate 12/26/16 1157 67     Resp 12/26/16 1157 16     Temp 12/26/16 1157 97.5 F (36.4 C)     Temp Source 12/26/16 1157 Oral     SpO2 12/26/16 1157 100 %     Weight 12/26/16 1158 155 lb (70.3 kg)     Height 12/26/16 1158 4\' 11"  (1.499 m)     Head Circumference --      Peak Flow --      Pain Score 12/26/16 1158 6     Pain Loc --      Pain Edu? --      Excl. in [place]? --     Constitutional: Alert and oriented. Well appearing and in no distress. Head: Normocephalic and atraumatic. Cardiovascular: Normal rate, regular rhythm. Normal distal pulses. Respiratory: Normal respiratory effort. No wheezes/rales/rhonchi. Gastrointestinal: Soft and nontender. No distention, Rebound, guarding, or rigidity. No CVA tenderness GU: Normal external  genitalia. Moderate white, thick discharge in the vault. No CMT. No adnexal masses.  Musculoskeletal: Nontender with normal range of motion in all extremities.  Neurologic:  Normal gait without ataxia. Normal speech and language. No gross focal neurologic deficits are appreciated. Skin:  Skin is warm, dry and intact. No rash noted. ____________________________________________   LABS  (pertinent positives/negatives) Labs Reviewed  WET PREP, GENITAL - Abnormal; Notable for the following:       Result Value   WBC, Wet Prep HPF POC FEW (*)    All other components within normal limits  CHLAMYDIA/NGC RT PCR ([facility] ONLY) - Abnormal; Notable for the following:    N gonorrhoeae DETECTED (*)    All other components within normal limits  URINALYSIS, COMPLETE (UACMP) WITH MICROSCOPIC - Abnormal; Notable for the following:    Color, Urine YELLOW (*)    APPearance CLEAR (*)    Squamous Epithelial / LPF 0-5 (*)    All other components within normal limits  BASIC METABOLIC PANEL - Abnormal; Notable for the following:    Calcium 8.8 (*)    All other components within normal limits  CBC WITH DIFFERENTIAL/PLATELET  HCG, QUANTITATIVE, PREGNANCY  POC URINE PREG, ED  POCT PREGNANCY, URINE   ____________________________________________   RADIOLOGY  Pelvic/Transvaginal US  IMPRESSION: Probable fibroid posteriorly and to the left of the uterine fundus measuring 1.4 cm in greatest dimension.  Endometrial stripe thickness is normal. There is a small amount of free fluid in the endometrium.  Complex cystic focus in the right ovary measuring 2.2 x 1.6 x 1.7 cm. This may reflect a hemorrhagic cyst but is a nonspecific finding. Correlation with clinical signs and symptoms and the patient's beta HCG and white blood cell count is needed. If a hemorrhagic cyst is felt most likely, follow-up ultrasound in 8-12 weeks would be useful.  Normal appearance of the left ovary. ____________________________________________  PROCEDURES  Zofran 4 mg ODT Norco 5-325 mg PO ____________________________________________  INITIAL IMPRESSION / ASSESSMENT AND PLAN / ED COURSE  Female patient with left pelvic pain which is improved at the time of this disposition. She reports onset during sexual intercourse. Her ultrasound was significant and in showing a right-sided ovarian cyst and  a small uterine fibroid. Patient's left ovary was unremarkable. Patient's blood labs also reassuringat this time. She will follow-up with [facility] for further evaluation and repeat ultrasound as recommended. Her GC probe is pending at the time of disposition.  ----------------------------------------- 5:26 PM on 12/26/2016 -----------------------------------------  Results of the GC probe are now available. Patient notified via phone to [facility] for treatment. She was just discharged, and is in the lobby.   ____________________________________________  FINAL CLINICAL IMPRESSION(S) / ED DIAGNOSES  Final diagnoses:  Abdominal pain, LLQ  Pelvic pain  Pelvic pain in female  Intramural leiomyoma of uterus  Cyst of right ovary  Gonorrhea      Melvenia Needles, PA-C 12/26/16 1735    Lavonia Drafts, MD 12/27/16 [phone]

## 2016-12-26 NOTE — Discharge Instructions (Signed)
Your exam and labs are normal. Your ultrasound shows an ovarian cyst on the right side. You also have a very small fibroid tumor. You should take the prescription pain medicine as needed. Call [facility] to schedule a follow-up appointment. Return to the ED for worsening pelvic pain or abnormal vaginal bleeding.

## 2016-12-26 NOTE — ED Triage Notes (Signed)
Pt c/o left upper abd pain with nausea since yesterday - pt denies diarrhea - denies chest pain or shortness of breath - she reports that Saturday during sex she "heard something pop" and the pain started the day after

## 2017-01-01 ENCOUNTER — Encounter: Payer: Self-pay | Admitting: Emergency Medicine

## 2017-01-01 ENCOUNTER — Emergency Department
Admission: EM | Admit: 2017-01-01 | Discharge: 2017-01-01 | Disposition: A | Discharge status: Home / Self Care | Payer: Self-pay | Attending: Emergency Medicine | Admitting: Emergency Medicine

## 2017-01-01 DIAGNOSIS — D259 Leiomyoma of uterus, unspecified: Secondary | ICD-10-CM | POA: Insufficient documentation

## 2017-01-01 DIAGNOSIS — Z791 Long term (current) use of non-steroidal anti-inflammatories (NSAID): Secondary | ICD-10-CM | POA: Insufficient documentation

## 2017-01-01 DIAGNOSIS — R109 Unspecified abdominal pain: Secondary | ICD-10-CM

## 2017-01-01 LAB — COMPREHENSIVE METABOLIC PANEL
ALT: 11 U/L — ABNORMAL LOW (ref 14–54)
AST: 16 U/L (ref 15–41)
Albumin: 4 g/dL (ref 3.5–5.0)
Alkaline Phosphatase: 45 U/L (ref 38–126)
Anion gap: 6 (ref 5–15)
BUN: 15 mg/dL (ref 6–20)
CHLORIDE: 107 mmol/L (ref 101–111)
CO2: 28 mmol/L (ref 22–32)
CREATININE: 0.79 mg/dL (ref 0.44–1.00)
Calcium: 9.1 mg/dL (ref 8.9–10.3)
GFR calc Af Amer: >60 mL/min (ref >60)
Glucose, Bld: 95 mg/dL (ref 65–99)
Potassium: 3.4 mmol/L — ABNORMAL LOW (ref 3.5–5.1)
Sodium: 141 mmol/L (ref 135–145)
Total Bilirubin: 1.4 mg/dL — ABNORMAL HIGH (ref 0.3–1.2)
Total Protein: 7.8 g/dL (ref 6.5–8.1)

## 2017-01-01 LAB — URINALYSIS, COMPLETE (UACMP) WITH MICROSCOPIC
Bacteria, UA: NONE SEEN
Bilirubin Urine: NEGATIVE
Glucose, UA: NEGATIVE mg/dL
Hgb urine dipstick: NEGATIVE
Ketones, ur: 5 mg/dL — AB
Leukocytes, UA: NEGATIVE
Nitrite: NEGATIVE
PH: 5 (ref 5.0–8.0)
Protein, ur: NEGATIVE mg/dL
SPECIFIC GRAVITY, URINE: 1.026 (ref 1.005–1.030)

## 2017-01-01 LAB — CBC
HCT: 36.9 % (ref 35.0–47.0)
Hemoglobin: 12.9 g/dL (ref 12.0–16.0)
MCH: 30.4 pg (ref 26.0–34.0)
MCHC: 34.8 g/dL (ref 32.0–36.0)
MCV: 87.4 fL (ref 80.0–100.0)
PLATELETS: 184 10*3/uL (ref 150–440)
RBC: 4.23 MIL/uL (ref 3.80–5.20)
RDW: 13.2 % (ref 11.5–14.5)
WBC: 5.5 10*3/uL (ref 3.6–11.0)

## 2017-01-01 LAB — LIPASE, BLOOD: LIPASE: 29 U/L (ref 11–51)

## 2017-01-01 LAB — POCT PREGNANCY, URINE: Preg Test, Ur: NEGATIVE

## 2017-01-01 MED ORDER — NAPROXEN 500 MG PO TABS: 500.0000 mg | ORAL_TABLET | Freq: Two times a day (BID) | ORAL | 0 refills | Status: DC

## 2017-01-01 MED ORDER — DOXYCYCLINE HYCLATE 100 MG PO CAPS: 100.0000 mg | ORAL_CAPSULE | Freq: Two times a day (BID) | ORAL | 0 refills | Status: DC

## 2017-01-01 NOTE — ED Triage Notes (Signed)
Pt reports left side abd pain started 1 weeks ago. Pt reports pain is getting worse and unrelieved by the Norco that she was given last week. Pt states last week she was diagnosed with gonorrhea and ovarian cyst. Was given treatment for the gonorrhea. Denies vomiting or diarrhea today. Denies fever.

## 2017-01-01 NOTE — ED Notes (Signed)

## 2017-01-01 NOTE — ED Provider Notes (Signed)
[facility] Emergency Department Provider Note  ____________________________________________  Time seen: Approximately 8:16 PM  I have reviewed the triage vital signs and the nursing notes.   HISTORY  Chief Complaint Abdominal Pain    HPI Alexandra Montes is a 41 y.o. female who complains of left-sided abdominal pain is been going on for the past week. She is previously evaluated in the ED about 5 days ago, had a ultrasound transvaginal as well as labs and pelvic exam. Workup was significant for a complex cyst on the right ovary, and left-sided uterine fibroid, and gonorrhea. It with antibiotics at that time for the gonorrhea. Denies fever chills vomiting diarrhea. No constipation. No vaginal discharge or bleeding. No other complaints. No aggravating or alleviating factors. Not affected by eating. Normal oral intake.     History reviewed. No pertinent past medical history.   There are no active problems to display for this patient.    History reviewed. No pertinent surgical history.   Prior to Admission medications   Medication Sig Start Date End Date Taking? Authorizing Provider  HYDROcodone-acetaminophen (NORCO) 5-325 MG tablet Take 1 tablet by mouth 3 (three) times daily as needed. 12/26/16  Yes Jenise V Bacon Menshew, PA-C  cyclobenzaprine (FLEXERIL) 5 MG tablet Take 1 tablet (5 mg total) by mouth every 8 (eight) hours as needed for muscle spasms. Patient not taking: Reported on 01/01/2017 09/11/15   Duanne Guess, PA-C  cyclobenzaprine (FLEXERIL) 5 MG tablet Take 1 tablet (5 mg total) by mouth every 8 (eight) hours as needed for muscle spasms. Patient not taking: Reported on 01/01/2017 09/11/15   Duanne Guess, PA-C  doxycycline (VIBRAMYCIN) 100 MG capsule Take 1 capsule (100 mg total) by mouth 2 (two) times daily. 01/01/17   Carrie Mew, MD  ibuprofen (ADVIL,MOTRIN) 600 MG tablet Take 1 tablet (600 mg total) by mouth every 8 (eight) hours as  needed. Patient not taking: Reported on 01/01/2017 05/18/16   Johnn Hai, PA-C  naproxen (NAPROSYN) 500 MG tablet Take 1 tablet (500 mg total) by mouth 2 (two) times daily with a meal. 01/01/17   Carrie Mew, MD     Allergies Patient has no known allergies.   No family history on file.  Social History Social History  Substance Use Topics  . Smoking status: Never Smoker  . Smokeless tobacco: Never Used  . Alcohol use No    Review of Systems  Constitutional:   No fever or chills.  ENT:   No sore throat. No rhinorrhea. Cardiovascular:   No chest pain. Respiratory:   No dyspnea or cough. Gastrointestinal:   Positive left-sided abdominal pain without vomiting and diarrhea.  Genitourinary:   Negative for dysuria or difficulty urinating. Musculoskeletal:   Negative for focal pain or swelling Neurological:   Negative for headaches 10-point ROS otherwise negative.  ____________________________________________   PHYSICAL EXAM:  VITAL SIGNS: ED Triage Vitals  Enc Vitals Group     BP 01/01/17 1658 111/62     Pulse Rate 01/01/17 1658 66     Resp 01/01/17 1658 20     Temp 01/01/17 1658 98.3 F (36.8 C)     Temp Source 01/01/17 1658 Oral     SpO2 01/01/17 1658 99 %     Weight 01/01/17 1659 155 lb (70.3 kg)     Height 01/01/17 1659 4\' 11"  (1.499 m)     Head Circumference --      Peak Flow --      Pain  Score 01/01/17 1704 8     Pain Loc --      Pain Edu? --      Excl. in [place]? --     Vital signs reviewed, nursing assessments reviewed.   Constitutional:   Alert and oriented. Well appearing and in no distress. Eyes:   No scleral icterus. No conjunctival pallor. PERRL. EOMI.  No nystagmus. ENT   Head:   Normocephalic and atraumatic.   Nose:   No congestion/rhinnorhea. No septal hematoma   Mouth/Throat:   MMM, no pharyngeal erythema. No peritonsillar mass.    Neck:   No stridor. No SubQ emphysema. No meningismus. Hematological/Lymphatic/Immunilogical:    No cervical lymphadenopathy. Cardiovascular:   RRR. Symmetric bilateral radial and DP pulses.  No murmurs.  Respiratory:   Normal respiratory effort without tachypnea nor retractions. Breath sounds are clear and equal bilaterally. No wheezes/rales/rhonchi. Gastrointestinal:   Soft and nontender. Non distended. There is no CVA tenderness.  No rebound, rigidity, or guarding. Genitourinary:   deferred Musculoskeletal:   Nontender with normal range of motion in all extremities. No joint effusions.  No lower extremity tenderness.  No edema. Neurologic:   Normal speech and language.  CN 2-10 normal. Motor grossly intact. No gross focal neurologic deficits are appreciated.  Skin:    Skin is warm, dry and intact. No rash noted.  No petechiae, purpura, or bullae.  ____________________________________________    LABS (pertinent positives/negatives) (all labs ordered are listed, but only abnormal results are displayed) Labs Reviewed  COMPREHENSIVE METABOLIC PANEL - Abnormal; Notable for the following:       Result Value   Potassium 3.4 (*)    ALT 11 (*)    Total Bilirubin 1.4 (*)    All other components within normal limits  URINALYSIS, COMPLETE (UACMP) WITH MICROSCOPIC - Abnormal; Notable for the following:    Color, Urine YELLOW (*)    APPearance CLEAR (*)    Ketones, ur 5 (*)    Squamous Epithelial / LPF 0-5 (*)    All other components within normal limits  LIPASE, BLOOD  CBC  POC URINE PREG, ED  POCT PREGNANCY, URINE   ____________________________________________   EKG    ____________________________________________    RADIOLOGY    ____________________________________________   PROCEDURES Procedures  ____________________________________________   INITIAL IMPRESSION / ASSESSMENT AND PLAN / ED COURSE  Pertinent labs & imaging results that were available during my care of the patient were reviewed by me and considered in my medical decision making (see chart for  details).  Patient presents with persistent left-sided abdominal pain. Vital signs unremarkable, exam benign and reassuring. Labs unremarkable. Given that she was positive for gonorrhea, even though Chlamydia was negative, I'll treat her with doxycycline for possible mild PID. Her exam does not reveal peritonitis at this time and she denies any genitourinary symptoms. Most likely this is from her uterine fibroid. I recommended close follow-up with primary care. Prescribed doxycycline, recommended stool softeners well.       ____________________________________________   FINAL CLINICAL IMPRESSION(S) / ED DIAGNOSES  Final diagnoses:  Uterine leiomyoma, unspecified location  Left lateral abdominal pain      New Prescriptions   DOXYCYCLINE (VIBRAMYCIN) 100 MG CAPSULE    Take 1 capsule (100 mg total) by mouth 2 (two) times daily.   NAPROXEN (NAPROSYN) 500 MG TABLET    Take 1 tablet (500 mg total) by mouth 2 (two) times daily with a meal.     Portions of this note were generated with  [employer]. Dictation errors may occur despite best attempts at proofreading.    Carrie Mew, MD 01/01/17 2020

## 2017-03-27 ENCOUNTER — Encounter: Payer: Self-pay | Admitting: Emergency Medicine

## 2017-03-27 ENCOUNTER — Emergency Department
Admission: EM | Admit: 2017-03-27 | Discharge: 2017-03-27 | Disposition: A | Discharge status: Home / Self Care | Payer: Self-pay | Attending: Emergency Medicine | Admitting: Emergency Medicine

## 2017-03-27 DIAGNOSIS — R1032 Left lower quadrant pain: Secondary | ICD-10-CM

## 2017-03-27 DIAGNOSIS — Z79899 Other long term (current) drug therapy: Secondary | ICD-10-CM | POA: Insufficient documentation

## 2017-03-27 DIAGNOSIS — D259 Leiomyoma of uterus, unspecified: Secondary | ICD-10-CM | POA: Insufficient documentation

## 2017-03-27 LAB — COMPREHENSIVE METABOLIC PANEL
ALBUMIN: 3.9 g/dL (ref 3.5–5.0)
ALK PHOS: 48 U/L (ref 38–126)
ALT: 14 U/L (ref 14–54)
ANION GAP: 5 (ref 5–15)
AST: 18 U/L (ref 15–41)
BUN: 12 mg/dL (ref 6–20)
CALCIUM: 8.8 mg/dL — AB (ref 8.9–10.3)
CHLORIDE: 105 mmol/L (ref 101–111)
CO2: 28 mmol/L (ref 22–32)
Creatinine, Ser: 0.71 mg/dL (ref 0.44–1.00)
GFR calc Af Amer: >60 mL/min (ref >60)
GFR calc non Af Amer: >60 mL/min (ref >60)
GLUCOSE: 88 mg/dL (ref 65–99)
Potassium: 3.5 mmol/L (ref 3.5–5.1)
Sodium: 138 mmol/L (ref 135–145)
Total Bilirubin: 1.1 mg/dL (ref 0.3–1.2)
Total Protein: 7.6 g/dL (ref 6.5–8.1)

## 2017-03-27 LAB — URINALYSIS, COMPLETE (UACMP) WITH MICROSCOPIC
BACTERIA UA: NONE SEEN
Bilirubin Urine: NEGATIVE
Glucose, UA: NEGATIVE mg/dL
Ketones, ur: 5 mg/dL — AB
Leukocytes, UA: NEGATIVE
Nitrite: NEGATIVE
PH: 5 (ref 5.0–8.0)
Protein, ur: NEGATIVE mg/dL
Specific Gravity, Urine: 1.02 (ref 1.005–1.030)

## 2017-03-27 LAB — CBC
HCT: 37.8 % (ref 35.0–47.0)
HEMOGLOBIN: 12.5 g/dL (ref 12.0–16.0)
MCH: 29.2 pg (ref 26.0–34.0)
MCHC: 33.1 g/dL (ref 32.0–36.0)
MCV: 88.3 fL (ref 80.0–100.0)
Platelets: 167 10*3/uL (ref 150–440)
RBC: 4.28 MIL/uL (ref 3.80–5.20)
RDW: 12.8 % (ref 11.5–14.5)
WBC: 2.9 10*3/uL — ABNORMAL LOW (ref 3.6–11.0)

## 2017-03-27 LAB — LIPASE, BLOOD: LIPASE: 20 U/L (ref 11–51)

## 2017-03-27 LAB — POCT PREGNANCY, URINE: PREG TEST UR: NEGATIVE

## 2017-03-27 MED ORDER — DICYCLOMINE HCL 20 MG PO TABS: 20.0000 mg | ORAL_TABLET | Freq: Three times a day (TID) | ORAL | 0 refills | Status: DC | PRN

## 2017-03-27 MED ORDER — NAPROXEN 500 MG PO TABS: 500.0000 mg | ORAL_TABLET | Freq: Two times a day (BID) | ORAL | 0 refills | Status: DC

## 2017-03-27 NOTE — ED Provider Notes (Signed)
[facility] Emergency Department Provider Note  ____________________________________________  Time seen: Approximately 7:26 PM  I have reviewed the triage vital signs and the nursing notes.   HISTORY  Chief Complaint Flank Pain    HPI MELI FALEY is a 41 y.o. female who complains of left lower quadrant abdominal pain that has been on for the past 2 months, worse today. She was supposed to follow up with gynecology due to an ovarian cyst and a "tumor" on her uterus, but she did not have insurance. She is now starting a new job in [employer], now plans of follow-up soon. Denies any vaginal bleeding or discharge. Last period was 03/12/2017 and lasted a day.  No dysuria frequency urgency. Not positional. No aggravating or alleviating factors. Moderate intensity, constant. Described as a dull ache.  History reviewed. No pertinent past medical history. Uterine fibroids  There are no active problems to display for this patient.    No past surgical history on file. None  Prior to Admission medications   Medication Sig Start Date End Date Taking? Authorizing Provider  cyclobenzaprine (FLEXERIL) 5 MG tablet Take 1 tablet (5 mg total) by mouth every 8 (eight) hours as needed for muscle spasms. Patient not taking: Reported on 01/01/2017 09/11/15   Duanne Guess, PA-C  cyclobenzaprine (FLEXERIL) 5 MG tablet Take 1 tablet (5 mg total) by mouth every 8 (eight) hours as needed for muscle spasms. Patient not taking: Reported on 01/01/2017 09/11/15   Duanne Guess, PA-C  dicyclomine (BENTYL) 20 MG tablet Take 1 tablet (20 mg total) by mouth 3 (three) times daily as needed for spasms. 03/27/17   Carrie Mew, MD  doxycycline (VIBRAMYCIN) 100 MG capsule Take 1 capsule (100 mg total) by mouth 2 (two) times daily. 01/01/17   Carrie Mew, MD  HYDROcodone-acetaminophen [facility]) 5-325 MG tablet Take 1 tablet by mouth 3 (three) times daily as needed. 12/26/16    Menshew, Dannielle Karvonen, PA-C  ibuprofen (ADVIL,MOTRIN) 600 MG tablet Take 1 tablet (600 mg total) by mouth every 8 (eight) hours as needed. Patient not taking: Reported on 01/01/2017 05/18/16   Johnn Hai, PA-C  naproxen (NAPROSYN) 500 MG tablet Take 1 tablet (500 mg total) by mouth 2 (two) times daily with a meal. 01/01/17   Carrie Mew, MD  naproxen (NAPROSYN) 500 MG tablet Take 1 tablet (500 mg total) by mouth 2 (two) times daily with a meal. 03/27/17   Carrie Mew, MD     Allergies Patient has no known allergies.   No family history on file.  Social History Social History  Substance Use Topics  . Smoking status: Never Smoker  . Smokeless tobacco: Never Used  . Alcohol use No    Review of Systems  Constitutional:   No fever or chills.  ENT:   No sore throat. No rhinorrhea. Lymphatic: No swollen glands, No extremity swelling Endocrine: No hot/cold flashes. No significant weight change. No neck swelling. Cardiovascular:   No chest pain or syncope. Respiratory:   No dyspnea or cough. Gastrointestinal:   Positive for left lower quadrant abdominal pain as above without vomiting and diarrhea.  Genitourinary:   Negative for dysuria or difficulty urinating. Musculoskeletal:   Negative for focal pain or swelling Neurological:   Negative for headaches or weakness. All other systems reviewed and are negative except as documented above in ROS and HPI.  ____________________________________________   PHYSICAL EXAM:  VITAL SIGNS: ED Triage Vitals [03/27/17 1304]  Enc Vitals Group  BP 120/80     Pulse Rate 64     Resp 16     Temp 98 F (36.7 C)     Temp Source Oral     SpO2 100 %     Weight 155 lb (70.3 kg)     Height 4\' 11"  (1.499 m)     Head Circumference      Peak Flow      Pain Score 9     Pain Loc      Pain Edu?      Excl. in [place]?     Vital signs reviewed, nursing assessments reviewed.   Constitutional:   Alert and oriented. Well appearing and  in no distress. Eyes:   No scleral icterus. No conjunctival pallor. PERRL. EOMI.  No nystagmus. ENT   Head:   Normocephalic and atraumatic.   Nose:   No congestion/rhinnorhea. No septal hematoma   Mouth/Throat:   MMM, no pharyngeal erythema. No peritonsillar mass.    Neck:   No stridor. No SubQ emphysema. No meningismus. Hematological/Lymphatic/Immunilogical:   No cervical lymphadenopathy. Cardiovascular:   RRR. Symmetric bilateral radial and DP pulses.  No murmurs.  Respiratory:   Normal respiratory effort without tachypnea nor retractions. Breath sounds are clear and equal bilaterally. No wheezes/rales/rhonchi. Gastrointestinal:   Soft and nontender. Non distended. There is no CVA tenderness.  No rebound, rigidity, or guarding. Genitourinary:   deferred Musculoskeletal:   Normal range of motion in all extremities. No joint effusions.  No lower extremity tenderness.  No edema. Neurologic:   Normal speech and language.  CN 2-10 normal. Motor grossly intact. No gross focal neurologic deficits are appreciated.  Skin:    Skin is warm, dry and intact. No rash noted.  No petechiae, purpura, or bullae.  ____________________________________________    LABS (pertinent positives/negatives) (all labs ordered are listed, but only abnormal results are displayed) Labs Reviewed  COMPREHENSIVE METABOLIC PANEL - Abnormal; Notable for the following:       Result Value   Calcium 8.8 (*)    All other components within normal limits  CBC - Abnormal; Notable for the following:    WBC 2.9 (*)    All other components within normal limits  URINALYSIS, COMPLETE (UACMP) WITH MICROSCOPIC - Abnormal; Notable for the following:    Color, Urine YELLOW (*)    APPearance CLEAR (*)    Hgb urine dipstick SMALL (*)    Ketones, ur 5 (*)    Squamous Epithelial / LPF 0-5 (*)    All other components within normal limits  LIPASE, BLOOD  POC URINE PREG, ED  POCT PREGNANCY, URINE    ____________________________________________   EKG    ____________________________________________    RADIOLOGY  No results found.  ____________________________________________   PROCEDURES Procedures  ____________________________________________   INITIAL IMPRESSION / ASSESSMENT AND PLAN / ED COURSE  Pertinent labs & imaging results that were available during my care of the patient were reviewed by me and considered in my medical decision making (see chart for details).  Patient well appearing no acute distress. Vital signs are normal, exam is unremarkable and reassuring, labs are unremarkable.Considering the patient's symptoms, medical history, and physical examination today, I have low suspicion for cholecystitis or biliary pathology, pancreatitis, perforation or bowel obstruction, hernia, intra-abdominal abscess, AAA or dissection, volvulus or intussusception, mesenteric ischemia, or appendicitis.  The suspicion for STI or PID TOA or torsion. Not pregnant. Follow-up with gynecology for further assessment given her likely symptomatic fibroids. Previous finding of  a complex ovarian cyst which was 2 cm, relatively small, was on the right and is not consistent with her current pain.        ____________________________________________   FINAL CLINICAL IMPRESSION(S) / ED DIAGNOSES  Final diagnoses:  Uterine leiomyoma, unspecified location  LLQ pain      New Prescriptions   DICYCLOMINE (BENTYL) 20 MG TABLET    Take 1 tablet (20 mg total) by mouth 3 (three) times daily as needed for spasms.   NAPROXEN (NAPROSYN) 500 MG TABLET    Take 1 tablet (500 mg total) by mouth 2 (two) times daily with a meal.     Portions of this note were generated with dragon dictation software. Dictation errors may occur despite best attempts at proofreading.    Carrie Mew, MD 03/27/17 1929

## 2017-03-27 NOTE — ED Triage Notes (Signed)
Pt reports intermittent left flank pain x2 months, reports pain woke her up from sleep at 0300. Pt reports taking doxycycline and naprosen without relief.

## 2017-03-27 NOTE — ED Notes (Signed)
Patient updated on plan of care.  This RN apologized for delays.

## 2017-04-03 ENCOUNTER — Emergency Department: Payer: Self-pay

## 2017-04-03 ENCOUNTER — Encounter: Payer: Self-pay | Admitting: Emergency Medicine

## 2017-04-03 ENCOUNTER — Emergency Department
Admission: EM | Admit: 2017-04-03 | Discharge: 2017-04-03 | Disposition: A | Discharge status: Home / Self Care | Payer: Self-pay | Attending: Emergency Medicine | Admitting: Emergency Medicine

## 2017-04-03 DIAGNOSIS — R1013 Epigastric pain: Secondary | ICD-10-CM | POA: Insufficient documentation

## 2017-04-03 LAB — LIPASE, BLOOD: Lipase: 23 U/L (ref 11–51)

## 2017-04-03 LAB — URINALYSIS, COMPLETE (UACMP) WITH MICROSCOPIC
BILIRUBIN URINE: NEGATIVE
Bacteria, UA: NONE SEEN
GLUCOSE, UA: NEGATIVE mg/dL
HGB URINE DIPSTICK: NEGATIVE
Ketones, ur: NEGATIVE mg/dL
LEUKOCYTES UA: NEGATIVE
NITRITE: NEGATIVE
PH: 7 (ref 5.0–8.0)
PROTEIN: NEGATIVE mg/dL
Specific Gravity, Urine: 1.012 (ref 1.005–1.030)
WBC, UA: NONE SEEN WBC/hpf (ref 0–5)

## 2017-04-03 LAB — COMPREHENSIVE METABOLIC PANEL
ALBUMIN: 3.6 g/dL (ref 3.5–5.0)
ALT: 35 U/L (ref 14–54)
AST: 54 U/L — AB (ref 15–41)
Alkaline Phosphatase: 43 U/L (ref 38–126)
Anion gap: 4 — ABNORMAL LOW (ref 5–15)
BILIRUBIN TOTAL: 0.7 mg/dL (ref 0.3–1.2)
BUN: 23 mg/dL — AB (ref 6–20)
CO2: 27 mmol/L (ref 22–32)
CREATININE: 0.73 mg/dL (ref 0.44–1.00)
Calcium: 8.7 mg/dL — ABNORMAL LOW (ref 8.9–10.3)
Chloride: 108 mmol/L (ref 101–111)
GFR calc Af Amer: >60 mL/min (ref >60)
GFR calc non Af Amer: >60 mL/min (ref >60)
GLUCOSE: 91 mg/dL (ref 65–99)
POTASSIUM: 4.6 mmol/L (ref 3.5–5.1)
Sodium: 139 mmol/L (ref 135–145)
TOTAL PROTEIN: 7 g/dL (ref 6.5–8.1)

## 2017-04-03 LAB — CBC
HEMATOCRIT: 35.2 % (ref 35.0–47.0)
HEMOGLOBIN: 11.8 g/dL — AB (ref 12.0–16.0)
MCH: 29.4 pg (ref 26.0–34.0)
MCHC: 33.5 g/dL (ref 32.0–36.0)
MCV: 87.7 fL (ref 80.0–100.0)
Platelets: 171 10*3/uL (ref 150–440)
RBC: 4.01 MIL/uL (ref 3.80–5.20)
RDW: 12.9 % (ref 11.5–14.5)
WBC: 4.8 10*3/uL (ref 3.6–11.0)

## 2017-04-03 LAB — POCT PREGNANCY, URINE: Preg Test, Ur: NEGATIVE

## 2017-04-03 MED ORDER — IOPAMIDOL (ISOVUE-300) INJECTION 61%
30.0000 mL | Freq: Once | INTRAVENOUS | Status: AC | PRN
  Administered 2017-04-03: 30 mL via ORAL

## 2017-04-03 MED ORDER — GI COCKTAIL ~~LOC~~
30.0000 mL | Freq: Once | ORAL | Status: AC
  Administered 2017-04-03: 30 mL via ORAL
  Filled 2017-04-03: qty 30

## 2017-04-03 MED ORDER — IOPAMIDOL (ISOVUE-300) INJECTION 61%
100.0000 mL | Freq: Once | INTRAVENOUS | Status: AC | PRN
  Administered 2017-04-03: 100 mL via INTRAVENOUS

## 2017-04-03 NOTE — ED Triage Notes (Signed)
Pt states she continues to have pain in her left upper abd and at times, left lower abd. Pt states she was told she had a cyst on her ovary, however, the pain continues to worsen, states the pain is constant. Vomiting this am, denies diarrhea.

## 2017-04-03 NOTE — ED Provider Notes (Signed)
[facility] Emergency Department Provider Note   ____________________________________________    I have reviewed the triage vital signs and the nursing notes.   HISTORY  Chief Complaint Abdominal Pain     HPI Alexandra Montes is a 41 y.o. female who presents with complaints of abdominal pain. Patient reports she has had this abdominal pain for several months now. She is frustrated that she has had repeat emergency department visits with no definite cause of her pain however she has not followed up with gynecology as asked. She reports mild epigastric burning today which is new as well as left sided abdominal pain which is old. She has barely been taking axis I: And Naprosyn with some relief of her abdominal pain. No fevers or chills reported. Normal stools. No vomiting.   History reviewed. No pertinent past medical history.  There are no active problems to display for this patient.   History reviewed. No pertinent surgical history.  Prior to Admission medications   Medication Sig Start Date End Date Taking? Authorizing Provider  doxycycline (VIBRAMYCIN) 100 MG capsule Take 1 capsule (100 mg total) by mouth 2 (two) times daily. 01/01/17  Yes Carrie Mew, MD  naproxen (NAPROSYN) 500 MG tablet Take 1 tablet (500 mg total) by mouth 2 (two) times daily with a meal. 03/27/17  Yes Carrie Mew, MD  cyclobenzaprine (FLEXERIL) 5 MG tablet Take 1 tablet (5 mg total) by mouth every 8 (eight) hours as needed for muscle spasms. Patient not taking: Reported on 01/01/2017 09/11/15   Duanne Guess, PA-C  cyclobenzaprine (FLEXERIL) 5 MG tablet Take 1 tablet (5 mg total) by mouth every 8 (eight) hours as needed for muscle spasms. Patient not taking: Reported on 01/01/2017 09/11/15   Duanne Guess, PA-C  dicyclomine (BENTYL) 20 MG tablet Take 1 tablet (20 mg total) by mouth 3 (three) times daily as needed for spasms. Patient not taking: Reported on 04/03/2017  03/27/17   Carrie Mew, MD  HYDROcodone-acetaminophen [facility]) 5-325 MG tablet Take 1 tablet by mouth 3 (three) times daily as needed. Patient not taking: Reported on 04/03/2017 12/26/16   Menshew, Dannielle Karvonen, PA-C  ibuprofen (ADVIL,MOTRIN) 600 MG tablet Take 1 tablet (600 mg total) by mouth every 8 (eight) hours as needed. Patient not taking: Reported on 01/01/2017 05/18/16   Johnn Hai, PA-C     Allergies Patient has no known allergies.  No family history on file.  Social History Social History  Substance Use Topics  . Smoking status: Never Smoker  . Smokeless tobacco: Never Used  . Alcohol use No    Review of Systems  Constitutional: No fever/chills Eyes: No visual changes.  ENT: No sore throat. Cardiovascular: Denies chest pain. Respiratory: Denies shortness of breath. Gastrointestinal: As above   Genitourinary: Negative for dysuria. Musculoskeletal: Negative for back pain. Skin: Negative for rash. Neurological: Negative for headaches or weakness   ____________________________________________   PHYSICAL EXAM:  VITAL SIGNS: ED Triage Vitals  Enc Vitals Group     BP 04/03/17 0829 119/74     Pulse Rate 04/03/17 0829 65     Resp 04/03/17 0829 18     Temp 04/03/17 0829 98.4 F (36.9 C)     Temp Source 04/03/17 0829 Oral     SpO2 04/03/17 0829 100 %     Weight 04/03/17 0829 155 lb (70.3 kg)     Height 04/03/17 0829 4\' 11"  (1.499 m)     Head Circumference --  Peak Flow --      Pain Score 04/03/17 0830 8     Pain Loc --      Pain Edu? --      Excl. in [place]? --     Constitutional: Alert and oriented. No acute distress. Pleasant and interactive Eyes: Conjunctivae are normal.  Head: Atraumatic. Nose: No congestion/rhinnorhea. Mouth/Throat: Mucous membranes are moist.    Cardiovascular: Normal rate, regular rhythm. Grossly normal heart sounds.  Good peripheral circulation. Respiratory: Normal respiratory effort.  No retractions. Lungs  CTAB. Gastrointestinal: Soft and nontender. No distention.  No CVA tenderness. Genitourinary: deferred Musculoskeletal: No lower extremity tenderness nor edema.  Warm and well perfused Neurologic:  Normal speech and language. No gross focal neurologic deficits are appreciated.  Skin:  Skin is warm, dry and intact. No rash noted. Psychiatric: Mood and affect are normal. Speech and behavior are normal.  ____________________________________________   LABS (all labs ordered are listed, but only abnormal results are displayed)  Labs Reviewed  CBC - Abnormal; Notable for the following:       Result Value   Hemoglobin 11.8 (*)    All other components within normal limits  COMPREHENSIVE METABOLIC PANEL - Abnormal; Notable for the following:    BUN 23 (*)    Calcium 8.7 (*)    AST 54 (*)    Anion gap 4 (*)    All other components within normal limits  URINALYSIS, COMPLETE (UACMP) WITH MICROSCOPIC - Abnormal; Notable for the following:    Color, Urine STRAW (*)    APPearance CLEAR (*)    Squamous Epithelial / LPF 0-5 (*)    All other components within normal limits  LIPASE, BLOOD  POCT PREGNANCY, URINE   ____________________________________________  EKG  None ____________________________________________  RADIOLOGY  CT abdomen pelvis unremarkable, corpus luteum cyst ____________________________________________   PROCEDURES  Procedure(s) performed: No    Critical Care performed: No ____________________________________________   INITIAL IMPRESSION / ASSESSMENT AND PLAN / ED COURSE  Pertinent labs & imaging results that were available during my care of the patient were reviewed by me and considered in my medical decision making (see chart for details).  Patient well-appearing and in no acute distress. Donnell exam is unremarkable. Lab work is reassuring. CT abdomen pelvis demonstrates a 18 mm corpus luteum cyst, given the patient is quite comfortable I will discharge her  with outpatient follow-up. Emphasized the need to schedule outpatient follow-up.    ____________________________________________   FINAL CLINICAL IMPRESSION(S) / ED DIAGNOSES  Final diagnoses:  Epigastric pain      NEW MEDICATIONS STARTED DURING THIS VISIT:  Discharge Medication List as of 04/03/2017 12:27 PM       Note:  This document was prepared using Dragon voice recognition software and may include unintentional dictation errors.    Lavonia Drafts, MD 04/03/17 [phone]

## 2017-05-30 ENCOUNTER — Encounter: Payer: Self-pay | Admitting: *Deleted

## 2017-05-30 ENCOUNTER — Ambulatory Visit
Admission: EM | Admit: 2017-05-30 | Discharge: 2017-05-30 | Disposition: A | Discharge status: Home / Self Care | Payer: Self-pay | Attending: Family Medicine | Admitting: Family Medicine

## 2017-05-30 DIAGNOSIS — L0291 Cutaneous abscess, unspecified: Secondary | ICD-10-CM

## 2017-05-30 DIAGNOSIS — L02213 Cutaneous abscess of chest wall: Secondary | ICD-10-CM

## 2017-05-30 MED ORDER — SULFAMETHOXAZOLE-TRIMETHOPRIM 800-160 MG PO TABS: 1.0000 | ORAL_TABLET | Freq: Two times a day (BID) | ORAL | 0 refills | Status: DC

## 2017-05-30 MED ORDER — MUPIROCIN 2 % EX OINT: 1.0000 "application " | TOPICAL_OINTMENT | Freq: Three times a day (TID) | CUTANEOUS | 0 refills | Status: DC

## 2017-05-30 NOTE — ED Triage Notes (Signed)
Patient started having symptom of abscess on her left arm pit 2 weeks ago. Patient does have a history of abscess.

## 2017-05-30 NOTE — ED Provider Notes (Signed)
CSN: [id number]     Arrival date & time 05/30/17  1018 History   First MD Initiated Contact with Patient 05/30/17 1054     Chief Complaint  Patient presents with  . Abscess   (Consider location/radiation/quality/duration/timing/severity/associated sxs/prior Treatment) HPI  41 year old female who presents with a abscess on her lateral chest wall that she's had for approximately 2 weeks. The abscess is just inferior to the axilla. A large amount of induration and fluctuance with a pustule on the upper posterior border. Patient has thought that it may be that is on its own but has worsened. She's had no fever or chills. She has another small pustule forming on the opposite chest wall and almost identical spot. It has not progressed other than the pustule stage. There is no evidence of cellulitis at the present time. She has had other abscesses that have formed in the axilla or in this area.        History reviewed. No pertinent past medical history. History reviewed. No pertinent surgical history. History reviewed. No pertinent family history. Social History  Substance Use Topics  . Smoking status: Never Smoker  . Smokeless tobacco: Never Used  . Alcohol use No   OB History    No data available     Review of Systems  Constitutional: Positive for activity change. Negative for chills, fatigue and fever.  Skin: Positive for color change and wound.  All other systems reviewed and are negative.   Allergies  Patient has no known allergies.  Home Medications   Prior to Admission medications   Medication Sig Start Date End Date Taking? Authorizing Provider  cyclobenzaprine (FLEXERIL) 5 MG tablet Take 1 tablet (5 mg total) by mouth every 8 (eight) hours as needed for muscle spasms. Patient not taking: Reported on 01/01/2017 09/11/15   Duanne Guess, PA-C  cyclobenzaprine (FLEXERIL) 5 MG tablet Take 1 tablet (5 mg total) by mouth every 8 (eight) hours as needed for muscle  spasms. Patient not taking: Reported on 01/01/2017 09/11/15   Duanne Guess, PA-C  dicyclomine (BENTYL) 20 MG tablet Take 1 tablet (20 mg total) by mouth 3 (three) times daily as needed for spasms. Patient not taking: Reported on 04/03/2017 03/27/17   Carrie Mew, MD  doxycycline (VIBRAMYCIN) 100 MG capsule Take 1 capsule (100 mg total) by mouth 2 (two) times daily. 01/01/17   Carrie Mew, MD  HYDROcodone-acetaminophen [facility]) 5-325 MG tablet Take 1 tablet by mouth 3 (three) times daily as needed. Patient not taking: Reported on 04/03/2017 12/26/16   Menshew, Dannielle Karvonen, PA-C  ibuprofen (ADVIL,MOTRIN) 600 MG tablet Take 1 tablet (600 mg total) by mouth every 8 (eight) hours as needed. Patient not taking: Reported on 01/01/2017 05/18/16   Johnn Hai, PA-C  mupirocin ointment (BACTROBAN) 2 % Apply 1 application topically 3 (three) times daily. 05/30/17   Lorin Picket, PA-C  naproxen (NAPROSYN) 500 MG tablet Take 1 tablet (500 mg total) by mouth 2 (two) times daily with a meal. 03/27/17   Carrie Mew, MD  sulfamethoxazole-trimethoprim (BACTRIM DS,SEPTRA DS) 800-160 MG tablet Take 1 tablet by mouth 2 (two) times daily. 05/30/17   Lorin Picket, PA-C   Meds Ordered and Administered this Visit  Medications - No data to display  BP (!) 126/91 (BP Location: Right Arm)   Pulse 66   Temp 98.2 F (36.8 C) (Oral)   Resp 16   Ht 4\' 11"  (1.499 m)   Wt 150 lb (68 kg)  LMP 05/09/2017   SpO2 100%   BMI 30.30 kg/m  No data found.   Physical Exam  Constitutional: She is oriented to person, place, and time. She appears well-developed and well-nourished. No distress.  HENT:  Head: Normocephalic.  Eyes: Pupils are equal, round, and reactive to light. Right eye exhibits no discharge. Left eye exhibits no discharge.  Neck: Normal range of motion.  Musculoskeletal: Normal range of motion.  Neurological: She is alert and oriented to person, place, and time.  Skin: Skin is warm  and dry. She is not diaphoretic. There is erythema.  Examination of the left chest wall in the mid axillary line just inferior to the axilla is a large abscess without apparent cellulitis. There is a pustule at the posterior border. There is a large amount of scaling and cellular debris overlying the main portion of the abscess. Induration is measured approximately 1/2 cm beyond the actual abscess itself. Induration is extremely tender in this area and she reacts rather inappropriately with any touch of the area. She has a smaller pustule on the opposite chest wall almost the exact same location with a pustule intact. There is no induration below the surface of the skin. There is no cellulitis in this area.  Psychiatric: She has a normal mood and affect. Her behavior is normal. Judgment and thought content normal.    Urgent Care Course     .Marland KitchenIncision and Drainage Date/Time: 05/30/2017 11:54 AM Performed by: Lorin Picket Authorized by: Frederich Cha   Consent:    Consent obtained:  Verbal   Consent given by:  Patient   Risks discussed:  Incomplete drainage, pain and infection   Alternatives discussed:  Alternative treatment Location:    Type:  Abscess   Location:  Trunk   Trunk location:  Chest Pre-procedure details:    Skin preparation:  Chloraprep Anesthesia (see MAR for exact dosages):    Anesthesia method:  Local infiltration   Local anesthetic:  Lidocaine 1% w/o epi Procedure type:    Complexity:  Complex Procedure details:    Needle aspiration: no     Incision types:  Single straight   Incision depth:  Subcutaneous   Scalpel blade:  11   Wound management:  Extensive cleaning   Drainage:  Purulent and serosanguinous   Drainage amount:  Moderate   Wound treatment:  Drain placed   Packing materials:  1/4 in gauze   Amount 1/4":  4 Post-procedure details:    Patient tolerance of procedure:  Tolerated with difficulty   (including critical care time) Patient reacted   with excessive crying and yelling during the procedure despite trying to calm her. Labs Review Labs Reviewed  AEROBIC CULTURE (SUPERFICIAL SPECIMEN)    Imaging Review No results found.   Visual Acuity Review  Right Eye Distance:   Left Eye Distance:   Bilateral Distance:    Right Eye Near:   Left Eye Near:    Bilateral Near:         MDM   1. Abscess    Discharge Medication List as of 05/30/2017 11:30 AM    START taking these medications   Details  mupirocin ointment (BACTROBAN) 2 % Apply 1 application topically 3 (three) times daily., Starting Tue 05/30/2017, Normal      Plan:   Patient was prescribed Bactroban and Septra DS.  1. Test/x-ray results and diagnosis reviewed with patient 2. rx as per orders; risks, benefits, potential side effects reviewed with patient 3. Recommend supportive treatment  with Compresses 3 times daily drying and applying Bactroban to the smaller pustule on the right. The abscess that was drained today, the patient will keep dry for48 hrs. It will drain and she was warned of this. she may need to change the dressing between now and 2 days when I will see her back again. At that time most likely require a second drain placement and this was described her in detail. If she has any signs of infection she was told to return immediately to the clinic. 4. F/u prn if symptoms worsen or don't improve     Lorin Picket, PA-C 05/30/17 1204

## 2017-06-01 ENCOUNTER — Encounter: Payer: Self-pay | Admitting: *Deleted

## 2017-06-01 ENCOUNTER — Ambulatory Visit
Admission: EM | Admit: 2017-06-01 | Discharge: 2017-06-01 | Disposition: A | Discharge status: Home / Self Care | Payer: Self-pay | Attending: Family Medicine | Admitting: Family Medicine

## 2017-06-01 DIAGNOSIS — Z5189 Encounter for other specified aftercare: Secondary | ICD-10-CM

## 2017-06-01 MED ORDER — SULFAMETHOXAZOLE-TRIMETHOPRIM 800-160 MG PO TABS: 1.0000 | ORAL_TABLET | Freq: Two times a day (BID) | ORAL | 0 refills | Status: DC

## 2017-06-01 NOTE — ED Provider Notes (Signed)
CSN: [id number]     Arrival date & time 06/01/17  1612 History   First MD Initiated Contact with Patient 06/01/17 1640     Chief Complaint  Patient presents with  . Wound Check   (Consider location/radiation/quality/duration/timing/severity/associated sxs/prior Treatment) HPI  This a 41 year old female who presents having an I&D performed on her medial chest wall on the left approximate 2 days ago. At that time she had a large abscess cellulitis. Required I&D with packing. She states it is much improved. The packing was removed by the nurse.       History reviewed. No pertinent past medical history. History reviewed. No pertinent surgical history. History reviewed. No pertinent family history. Social History  Substance Use Topics  . Smoking status: Never Smoker  . Smokeless tobacco: Never Used  . Alcohol use No   OB History    No data available     Review of Systems  Constitutional: Positive for activity change. Negative for appetite change, chills, fatigue and fever.  Skin: Positive for wound.  All other systems reviewed and are negative.   Allergies  Patient has no known allergies.  Home Medications   Prior to Admission medications   Medication Sig Start Date End Date Taking? Authorizing Provider  cyclobenzaprine (FLEXERIL) 5 MG tablet Take 1 tablet (5 mg total) by mouth every 8 (eight) hours as needed for muscle spasms. Patient not taking: Reported on 01/01/2017 09/11/15   Duanne Guess, PA-C  cyclobenzaprine (FLEXERIL) 5 MG tablet Take 1 tablet (5 mg total) by mouth every 8 (eight) hours as needed for muscle spasms. Patient not taking: Reported on 01/01/2017 09/11/15   Duanne Guess, PA-C  dicyclomine (BENTYL) 20 MG tablet Take 1 tablet (20 mg total) by mouth 3 (three) times daily as needed for spasms. Patient not taking: Reported on 04/03/2017 03/27/17   Carrie Mew, MD  doxycycline (VIBRAMYCIN) 100 MG capsule Take 1 capsule (100 mg total) by mouth 2 (two)  times daily. 01/01/17   Carrie Mew, MD  HYDROcodone-acetaminophen [facility]) 5-325 MG tablet Take 1 tablet by mouth 3 (three) times daily as needed. Patient not taking: Reported on 04/03/2017 12/26/16   Menshew, Dannielle Karvonen, PA-C  ibuprofen (ADVIL,MOTRIN) 600 MG tablet Take 1 tablet (600 mg total) by mouth every 8 (eight) hours as needed. Patient not taking: Reported on 01/01/2017 05/18/16   Johnn Hai, PA-C  mupirocin ointment (BACTROBAN) 2 % Apply 1 application topically 3 (three) times daily. 05/30/17   Lorin Picket, PA-C  naproxen (NAPROSYN) 500 MG tablet Take 1 tablet (500 mg total) by mouth 2 (two) times daily with a meal. 03/27/17   Carrie Mew, MD  sulfamethoxazole-trimethoprim (BACTRIM DS,SEPTRA DS) 800-160 MG tablet Take 1 tablet by mouth 2 (two) times daily. 06/01/17   Lorin Picket, PA-C   Meds Ordered and Administered this Visit  Medications - No data to display  BP 111/68 (BP Location: Left Arm)   Pulse 73   Temp 98.5 F (36.9 C) (Oral)   Resp 16   LMP 05/09/2017   SpO2 100%  No data found.   Physical Exam  Constitutional: She is oriented to person, place, and time. She appears well-developed and well-nourished. No distress.  HENT:  Head: Normocephalic.  Eyes: Pupils are equal, round, and reactive to light. Right eye exhibits no discharge. Left eye exhibits no discharge.  Neck: Normal range of motion.  Musculoskeletal: Normal range of motion.  Neurological: She is alert and oriented to person, place,  and time.  Skin: Skin is warm and dry. She is not diaphoretic. No erythema.  Examination of the left lateral chest wall shows a previous incision to be open but without any significant drainage that could be expressed from the wound itself. The area of induration has decreased but there is still some left. There is no cellulitis present. Examination of the right lateral chest wall where there is a small pustular folliculitis the pustule now resolved and the  area is much improved as well.  Psychiatric: She has a normal mood and affect. Her behavior is normal. Judgment and thought content normal.  Nursing note and vitals reviewed.   Urgent Care Course     Procedures (including critical care time)  Labs Review Labs Reviewed - No data to display  Imaging Review No results found.   Visual Acuity Review  Right Eye Distance:   Left Eye Distance:   Bilateral Distance:    Right Eye Near:   Left Eye Near:    Bilateral Near:         MDM   1. Visit for wound check    Current Discharge Medication List    Patient will continue with the Septra and another prescription was called in to provide continuous coverage until her follow-up visit on 06/05/2017. Patient will continue with a 3-4 times daily warm compresses as outlined before. She will then dry the area and apply Bactroban ointment.  Encouraged her to continue the Bactroban on the opposite chest wall folliculitis as well. If she notices any change with increasing pain redness or increasing drainage she should return to our clinic immediately. Otherwise I will see her back on 06/05/2017.    Lorin Picket, PA-C 06/01/17 1708

## 2017-06-01 NOTE — ED Triage Notes (Signed)
Patient has arrived for packing removal from abscess under left arm.

## 2017-06-02 LAB — AEROBIC CULTURE  (SUPERFICIAL SPECIMEN): Special Requests: NORMAL

## 2017-06-02 LAB — AEROBIC CULTURE W GRAM STAIN (SUPERFICIAL SPECIMEN)

## 2017-06-05 ENCOUNTER — Ambulatory Visit
Admission: EM | Admit: 2017-06-05 | Discharge: 2017-06-05 | Disposition: A | Discharge status: Home / Self Care | Payer: BLUE CROSS/BLUE SHIELD | Attending: Family Medicine | Admitting: Family Medicine

## 2017-06-05 ENCOUNTER — Encounter: Payer: Self-pay | Admitting: *Deleted

## 2017-06-05 DIAGNOSIS — L02213 Cutaneous abscess of chest wall: Secondary | ICD-10-CM

## 2017-06-05 DIAGNOSIS — Z5189 Encounter for other specified aftercare: Secondary | ICD-10-CM | POA: Diagnosis not present

## 2017-06-05 MED ORDER — MUPIROCIN 2 % EX OINT: 1.0000 "application " | TOPICAL_OINTMENT | Freq: Three times a day (TID) | CUTANEOUS | 0 refills | Status: DC

## 2017-06-05 NOTE — ED Provider Notes (Signed)
[facility]    CSN: [id number] Arrival date & time: 06/05/17  [place]     History   Chief Complaint Chief Complaint  Patient presents with  . Wound Check    HPI Alexandra Montes is a 41 y.o. female.   Actions here for wound check follow-up. Patient had abscess opened on Tuesday. At that time she was placed on Septra and the packing was removed Thursday she was placed on Bactroban ointment as well. She reports improvement of the wound.   The history is provided by the patient and the spouse. No language interpreter was used.  Wound Check  This is a new problem. The problem has been gradually improving. Pertinent negatives include no chest pain, no abdominal pain, no headaches and no shortness of breath. Nothing aggravates the symptoms. Nothing relieves the symptoms. She has tried nothing for the symptoms. The treatment provided moderate relief.    History reviewed. No pertinent past medical history.  There are no active problems to display for this patient.   History reviewed. No pertinent surgical history.  OB History    No data available       Home Medications    Prior to Admission medications   Medication Sig Start Date End Date Taking? Authorizing Provider  sulfamethoxazole-trimethoprim (BACTRIM DS,SEPTRA DS) 800-160 MG tablet Take 1 tablet by mouth 2 (two) times daily. 06/01/17  Yes Lorin Picket, PA-C  cyclobenzaprine (FLEXERIL) 5 MG tablet Take 1 tablet (5 mg total) by mouth every 8 (eight) hours as needed for muscle spasms. Patient not taking: Reported on 01/01/2017 09/11/15   Duanne Guess, PA-C  cyclobenzaprine (FLEXERIL) 5 MG tablet Take 1 tablet (5 mg total) by mouth every 8 (eight) hours as needed for muscle spasms. Patient not taking: Reported on 01/01/2017 09/11/15   Duanne Guess, PA-C  dicyclomine (BENTYL) 20 MG tablet Take 1 tablet (20 mg total) by mouth 3 (three) times daily as needed for spasms. Patient not taking: Reported on  04/03/2017 03/27/17   Carrie Mew, MD  doxycycline (VIBRAMYCIN) 100 MG capsule Take 1 capsule (100 mg total) by mouth 2 (two) times daily. 01/01/17   Carrie Mew, MD  HYDROcodone-acetaminophen [facility]) 5-325 MG tablet Take 1 tablet by mouth 3 (three) times daily as needed. Patient not taking: Reported on 04/03/2017 12/26/16   Menshew, Dannielle Karvonen, PA-C  ibuprofen (ADVIL,MOTRIN) 600 MG tablet Take 1 tablet (600 mg total) by mouth every 8 (eight) hours as needed. Patient not taking: Reported on 01/01/2017 05/18/16   Johnn Hai, PA-C  mupirocin ointment (BACTROBAN) 2 % Apply 1 application topically 3 (three) times daily. 06/05/17   Frederich Cha, MD  naproxen (NAPROSYN) 500 MG tablet Take 1 tablet (500 mg total) by mouth 2 (two) times daily with a meal. 03/27/17   Carrie Mew, MD    Family History History reviewed. No pertinent family history.  Social History Social History  Substance Use Topics  . Smoking status: Never Smoker  . Smokeless tobacco: Never Used  . Alcohol use No     Allergies   Patient has no known allergies.   Review of Systems Review of Systems  Respiratory: Negative for shortness of breath.   Cardiovascular: Negative for chest pain.  Gastrointestinal: Negative for abdominal pain.  Skin: Positive for wound.  Neurological: Negative for headaches.  All other systems reviewed and are negative.    Physical Exam Triage Vital Signs ED Triage Vitals  Enc Vitals Group  BP 06/05/17 1710 112/68     Pulse Rate 06/05/17 1710 64     Resp 06/05/17 1710 16     Temp 06/05/17 1710 98.9 F (37.2 C)     Temp Source 06/05/17 1710 Oral     SpO2 06/05/17 1710 100 %     Weight --      Height --      Head Circumference --      Peak Flow --      Pain Score 06/05/17 1712 0     Pain Loc --      Pain Edu? --      Excl. in [place]? --    No data found.   Updated Vital Signs BP 112/68 (BP Location: Left Arm)   Pulse 64   Temp 98.9 F (37.2 C) (Oral)   Resp  16   LMP 04/29/2017   SpO2 100%   Visual Acuity Right Eye Distance:   Left Eye Distance:   Bilateral Distance:    Right Eye Near:   Left Eye Near:    Bilateral Near:     Physical Exam  Constitutional: She is oriented to person, place, and time. She appears well-developed and well-nourished.  HENT:  Head: Normocephalic.  Eyes: Pupils are equal, round, and reactive to light.  Neck: Normal range of motion.  Pulmonary/Chest: Effort normal.  Musculoskeletal: Normal range of motion.  Neurological: She is alert and oriented to person, place, and time.  Skin: No rash noted. There is erythema.  Psychiatric: She has a normal mood and affect.  Vitals reviewed.    UC Treatments / Results  Labs (all labs ordered are listed, but only abnormal results are displayed) Labs Reviewed - No data to display  EKG  EKG Interpretation None       Radiology No results found.  Procedures Procedures (including critical care time)  Medications Ordered in UC Medications - No data to display   Initial Impression / Assessment and Plan / UC Course  I have reviewed the triage vital signs and the nursing notes.  Pertinent labs & imaging results that were available during my care of the patient were reviewed by me and considered in my medical decision making (see chart for details).     Patient has improved greatly will renew her Bactroban ointment she spray had been over Septra follow-up as needed with PCP next 3 week. If needed.  Final Clinical Impressions(s) / UC Diagnoses   Final diagnoses:  Visit for wound check  Wound check, abscess    New Prescriptions Current Discharge Medication List      Note: This dictation was prepared with Dragon dictation along with smaller phrase technology. Any transcriptional errors that result from this process are unintentional.   Frederich Cha, MD 06/05/17 1752

## 2017-06-05 NOTE — ED Triage Notes (Signed)
Patient has returned for wound check.

## 2017-08-17 ENCOUNTER — Encounter: Payer: Self-pay | Admitting: Obstetrics and Gynecology

## 2017-08-21 ENCOUNTER — Other Ambulatory Visit: Payer: Self-pay | Admitting: Nurse Practitioner

## 2017-08-21 ENCOUNTER — Encounter: Payer: Self-pay | Admitting: Obstetrics and Gynecology

## 2017-08-21 DIAGNOSIS — Z1231 Encounter for screening mammogram for malignant neoplasm of breast: Secondary | ICD-10-CM

## 2017-08-30 ENCOUNTER — Other Ambulatory Visit: Payer: BLUE CROSS/BLUE SHIELD

## 2017-09-04 DIAGNOSIS — M9510 Cauliflower ear, unspecified ear: Secondary | ICD-10-CM | POA: Insufficient documentation

## 2017-09-04 DIAGNOSIS — G5600 Carpal tunnel syndrome, unspecified upper limb: Secondary | ICD-10-CM | POA: Insufficient documentation

## 2017-09-07 ENCOUNTER — Encounter: Payer: Self-pay | Admitting: Obstetrics and Gynecology

## 2017-09-07 ENCOUNTER — Ambulatory Visit (INDEPENDENT_AMBULATORY_CARE_PROVIDER_SITE_OTHER): Payer: BLUE CROSS/BLUE SHIELD | Admitting: Obstetrics and Gynecology

## 2017-09-07 VITALS — BP 148/87 | HR 64 | Ht 59.0 in | Wt 158.4 lb

## 2017-09-07 DIAGNOSIS — R102 Pelvic and perineal pain: Secondary | ICD-10-CM | POA: Diagnosis not present

## 2017-09-07 DIAGNOSIS — N83201 Unspecified ovarian cyst, right side: Secondary | ICD-10-CM

## 2017-09-07 DIAGNOSIS — K5901 Slow transit constipation: Secondary | ICD-10-CM

## 2017-09-07 DIAGNOSIS — D259 Leiomyoma of uterus, unspecified: Secondary | ICD-10-CM

## 2017-09-07 DIAGNOSIS — Z124 Encounter for screening for malignant neoplasm of cervix: Secondary | ICD-10-CM | POA: Diagnosis not present

## 2017-09-07 NOTE — Patient Instructions (Signed)
Pelvic Pain, Female Pelvic pain is pain in your lower abdomen, below your belly button and between your hips. The pain may start suddenly (acute), keep coming back (recurring), or last a long time (chronic). Pelvic pain that lasts longer than six months is considered chronic. Pelvic pain may affect your:  Reproductive organs.  Urinary system.  Digestive tract.  Musculoskeletal system.  There are many potential causes of pelvic pain. Sometimes, the pain can be a result of digestive or urinary conditions, strained muscles or ligaments, or even reproductive conditions. Sometimes the cause of pelvic pain is not known. Follow these instructions at home:  Take over-the-counter and prescription medicines only as told by your health care provider.  Rest as told by your health care provider.  Do not have sex it if hurts.  Keep a journal of your pelvic pain. Write down: ? When the pain started. ? Where the pain is located. ? What seems to make the pain better or worse, such as food or your menstrual cycle. ? Any symptoms you have along with the pain.  Keep all follow-up visits as told by your health care provider. This is important. Contact a health care provider if:  Medicine does not help your pain.  Your pain comes back.  You have new symptoms.  You have abnormal vaginal discharge or bleeding, including bleeding after menopause.  You have a fever or chills.  You are constipated.  You have blood in your urine or stool.  You have foul-smelling urine.  You feel weak or lightheaded. Get help right away if:  You have sudden severe pain.  Your pain gets steadily worse.  You have severe pain along with fever, nausea, vomiting, or excessive sweating.  You lose consciousness. This information is not intended to replace advice given to you by your health care provider. Make sure you discuss any questions you have with your health care provider. Document Released: 10/04/2004  Document Revised: 12/02/2015 Document Reviewed: 08/28/2015 Elsevier Interactive Patient Education  2018 [employer]  

## 2017-09-07 NOTE — Progress Notes (Addendum)
GYNECOLOGY PROGRESS NOTE  Subjective:    Patient ID: Alexandra Montes, female    DOB: [birth year], 41 y.o.   MRN: [id number]  HPI  Patient is a 41 y.o. female who presents for complaints of pelvic pain off and on x 6 months.  Also referred by PCP for h/o right ovarian cyst (diagnosed almost 8 months ago, per patient). Denies problems with urination.  Does note irregular bowel movements (q 2-3 days), however notes she used to be regular (q daily).    Has tried laxatives which give only temporary relief. Does notice pain sometimes before having a BM. Denies blood in stool.    Menarche:7 Cycles last for several hours and then goes away.  Occurs monthly.  Last pap smear: cannot recall when last pap smear was.  Denies h/o abnormal pap smears.  Last mammogram: 01/2014.  Scheduled for mammogram 09/15/17.    Past Medical History:  Diagnosis Date  . Breast mass   . Carpal tunnel syndrome   . Cauliflower ear, right   . Headache   . Ovarian cyst    Right    Family History  Problem Relation Age of Onset  . Heart disease Mother   . Schizophrenia Father      Past Surgical History:  Procedure Laterality Date  . NO PAST SURGERIES      Social History   Social History  . Marital status: Legally Separated    Spouse name: N/A  . Number of children: N/A  . Years of education: N/A   Occupational History  . Not on file.   Social History Main Topics  . Smoking status: Never Smoker  . Smokeless tobacco: Never Used  . Alcohol use No  . Drug use: No  . Sexual activity: Yes    Birth control/ protection: None   Other Topics Concern  . Not on file   Social History Narrative  . No narrative on file    Current Outpatient Prescriptions on File Prior to Visit  Medication Sig Dispense Refill  . ibuprofen (ADVIL,MOTRIN) 600 MG tablet Take 1 tablet (600 mg total) by mouth every 8 (eight) hours as needed. 30 tablet 0  . mupirocin ointment (BACTROBAN) 2 % Apply 1 application topically 3  (three) times daily. 22 g 0  . dicyclomine (BENTYL) 20 MG tablet Take 1 tablet (20 mg total) by mouth 3 (three) times daily as needed for spasms. (Patient not taking: Reported on 04/03/2017) 30 tablet 0  . HYDROcodone-acetaminophen (NORCO) 5-325 MG tablet Take 1 tablet by mouth 3 (three) times daily as needed. (Patient not taking: Reported on 04/03/2017) 12 tablet 0   No current facility-administered medications on file prior to visit.     No Known Allergies  Review of Systems Constitutional: negative for chills, fatigue, fevers and sweats Eyes: negative for irritation, redness and visual disturbance Ears, nose, mouth, throat, and face: negative for hearing loss, nasal congestion, snoring and tinnitus Respiratory: negative for asthma, cough, sputum Cardiovascular: negative for chest pain, dyspnea, exertional chest pressure/discomfort, irregular heart beat, palpitations and syncope Gastrointestinal: negative for abdominal pain, change in bowel habits, nausea and vomiting. Positive for constipation/slow bowels.  Genitourinary: positive for pelvic pain (see HPI). Nnegative for abnormal menstrual periods, genital lesions, sexual problems and vaginal discharge, dysuria and urinary incontinence Integument/breast: negative for breast lump, breast tenderness and nipple discharge Hematologic/lymphatic: negative for bleeding and easy bruising Musculoskeletal:negative for back pain and muscle weakness Neurological: negative for dizziness, headaches, vertigo and weakness Endocrine: negative  for diabetic symptoms including polydipsia, polyuria and skin dryness Allergic/Immunologic: negative for hay fever and urticaria      Objective:   Blood pressure (!) 148/87, pulse 64, height 4\' 11"  (1.499 m), weight 158 lb 6.4 oz (71.8 kg), last menstrual period 09/04/2017. General appearance: alert and no distress Abdomen: soft, non-tender; bowel sounds normal; no masses,  no organomegaly Pelvic: external  genitalia normal, rectovaginal septum normal.  Vagina without discharge.  Cervix normal appearing, no lesions and no motion tenderness.  Uterus mobile, nontender, normal shape and size.  Adnexae non-palpable, nontender bilaterally.  Extremities: extremities normal, atraumatic, no cyanosis or edema Neurologic: Grossly normal    Imaging:  CLINICAL DATA:  One day of left lower quadrant pain. Onset of last normal menstrual period was December 06, 2016  EXAM: TRANSABDOMINAL AND TRANSVAGINAL ULTRASOUND OF PELVIS (performed 12/26/2016)  TECHNIQUE: Both transabdominal and transvaginal ultrasound examinations of the pelvis were performed. Transabdominal technique was performed for global imaging of the pelvis including uterus, ovaries, adnexal regions, and pelvic cul-de-sac. It was necessary to proceed with endovaginal exam following the transabdominal exam to visualize the uterus, endometrium, and ovaries.  COMPARISON:  Abdominopelvic CT scan of October 25, 2012  FINDINGS: Uterus  Measurements: 7.2 x 3.4 x 3.3 cm. There is a focal hypoechoic rounded structure to the left of midline posteriorly in the fundus measuring 1.4 x 1.2 x 1.3 cm.  Endometrium  Thickness: 2.2 mm. There is a small amount of free fluid within the endometrial cavity.  Right ovary  Measurements: 4.1 x 1.9 x 2.0 cm. There is a complex cystic focus measuring 2.2 x 1.6 x 1.7 cm. There is a focus of shadowing within the right ovary which may reflect calcification.  Left ovary  Measurements: 3.8 x 2.2 x 1.7 cm. Normal appearance/no adnexal mass.  Other findings  There is no free pelvic fluid.  IMPRESSION: Probable fibroid posteriorly and to the left of the uterine fundus measuring 1.4 cm in greatest dimension.  Endometrial stripe thickness is normal. There is a small amount of free fluid in the endometrium.  Complex cystic focus in the right ovary measuring 2.2 x 1.6 x 1.7 cm. This may  reflect a hemorrhagic cyst but is a nonspecific finding. Correlation with clinical signs and symptoms and the patient's beta HCG and white blood cell count is needed. If a hemorrhagic cyst is felt most likely, follow-up ultrasound in 8-12 weeks would be useful.  Normal appearance of the left ovary.     CLINICAL DATA:  Left flank pain  EXAM: CT ABDOMEN AND PELVIS WITH CONTRAST (performed 04/03/2017)  TECHNIQUE: Multidetector CT imaging of the abdomen and pelvis was performed using the standard protocol following bolus administration of intravenous contrast.  CONTRAST:  177mL ISOVUE-300 IOPAMIDOL (ISOVUE-300) INJECTION 61%  COMPARISON:  None.  FINDINGS: Lower chest: Lung bases clear.  Small left effusion.  Hepatobiliary: 15 mm hypodensity in the left lobe liver adjacent the falciform ligament likely fatty infiltration. 8 mm cyst in the right lobe of the liver laterally. Normal gallbladder and bile ducts.  Pancreas: Negative  Spleen: Small hypodensities in the spleen.  Normal splenic size.  Adrenals/Urinary Tract: Normal kidneys. No renal mass or obstruction or stone. Urinary bladder normal.  Stomach/Bowel: Negative for bowel obstruction. Negative for bowel mass or edema. Appendix not visualized. Negative for diverticulitis.  Vascular/Lymphatic: Negative  Reproductive: Rim enhancing cyst left ovary measuring 18 mm compatible with corpus luteum cyst. Minimal free fluid in the pelvis. Small uterine fibroids.  Other: Negative  Musculoskeletal: Negative  IMPRESSION: 18 mm corpus luteum cyst left ovary with small amount of free fluid. No other acute abnormality.    Assessment:   Pelvic pain H/o ovarian cyst Fibroid uterus Constipation Cervical cancer screening  Plan:   -  Patient with a h/o pelvic pain for at least 6 months.  Has imaging which noted a small left ovarian cyst in February, ~ 2.2 cm on ultrasound, and no longer noted on CT scan  in May, but a small right corpus luteal cyst was noted on the right side (1.8 cm). Discussed with patient that due to the size and nature of the cyst, it is not likely the cause of her persistent abdominal pain as the cysts appeared to be very small and resolving. Follow up imaging would be recommended for cysts 4 cm or larger. No tenderness noted on exam today, and adnexal exam was unremarkable.  - H/o ovarian cyst, see above discussion of pelvic pain.  - Fibroid uterus noted on ultrasound, small, 1.5 cm, not likely a cause of patient's pelvic pain. Patient notes that she was unaware of fibroids prior to scan.  - Constipation - patient notes that bowel movements are firm but not difficult to pass, denies straining. Notes that bowel movements are coming approximately every 3 days (prior to this she used to be regular, occurring daily), and this has been going on for "a while". Has used laxatives with only temporary relief.  Discussed that she should incorporate more water and fiber into her diet.  Also discussed that if no other causes for pelvic pain found on ultrasound, she may benefit from a consultation with GI.  Patient notes that her PCP tried to get her to see a GI doctor around onset of her pain, but notes lapse in her insurance and never followed up once her insurance was obtained again.  Will place referral.  - Patient notes that she cannot recall when her last pap smear was.  Will perform today.  - To f/u if symptoms persist or worsen.     Rubie Maid, MD [facility]

## 2017-09-11 ENCOUNTER — Encounter: Payer: Self-pay | Admitting: Obstetrics and Gynecology

## 2017-09-11 DIAGNOSIS — K5901 Slow transit constipation: Secondary | ICD-10-CM | POA: Insufficient documentation

## 2017-09-11 DIAGNOSIS — D259 Leiomyoma of uterus, unspecified: Secondary | ICD-10-CM | POA: Insufficient documentation

## 2017-09-11 DIAGNOSIS — N83201 Unspecified ovarian cyst, right side: Secondary | ICD-10-CM | POA: Insufficient documentation

## 2017-09-11 DIAGNOSIS — R102 Pelvic and perineal pain: Secondary | ICD-10-CM | POA: Insufficient documentation

## 2017-09-11 LAB — PAP IG, CT-NG TV RFX HPV ALL
CHLAMYDIA, NUC. ACID AMP: NEGATIVE
Gonococcus, Nuc. Acid Amp: NEGATIVE
PAP Smear Comment: 0
TRICH VAG BY NAA: NEGATIVE

## 2017-09-11 NOTE — Addendum Note (Signed)
Addended by: Augusto Gamble on: 09/11/2017 08:15 PM   Modules accepted: Orders

## 2017-09-15 ENCOUNTER — Other Ambulatory Visit: Payer: BLUE CROSS/BLUE SHIELD

## 2017-09-20 DIAGNOSIS — I10 Essential (primary) hypertension: Secondary | ICD-10-CM | POA: Insufficient documentation

## 2017-09-21 ENCOUNTER — Emergency Department
Admission: EM | Admit: 2017-09-21 | Discharge: 2017-09-22 | Disposition: A | Discharge status: Home / Self Care | Payer: BLUE CROSS/BLUE SHIELD | Attending: Emergency Medicine | Admitting: Emergency Medicine

## 2017-09-21 ENCOUNTER — Encounter: Payer: Self-pay | Admitting: Emergency Medicine

## 2017-09-21 DIAGNOSIS — I10 Essential (primary) hypertension: Secondary | ICD-10-CM | POA: Diagnosis not present

## 2017-09-21 DIAGNOSIS — R51 Headache: Secondary | ICD-10-CM | POA: Diagnosis present

## 2017-09-21 DIAGNOSIS — R519 Headache, unspecified: Secondary | ICD-10-CM

## 2017-09-21 NOTE — ED Triage Notes (Signed)
Patient with complaint of headache times 4 hours. Patient states that she took tylenol and a nap with no improvement. Patient states that he blood pressure was 157/98 at home and recheck was 160/87. Patient states that she was started on HCTZ yesterday for the same.

## 2017-09-22 MED ORDER — IBUPROFEN 600 MG PO TABS
600.0000 mg | ORAL_TABLET | Freq: Once | ORAL | Status: AC
  Administered 2017-09-22: 600 mg via ORAL
  Filled 2017-09-22: qty 1

## 2017-09-22 MED ORDER — BUTALBITAL-APAP-CAFFEINE 50-325-40 MG PO TABS
1.0000 | ORAL_TABLET | Freq: Once | ORAL | Status: AC
  Administered 2017-09-22: 1 via ORAL
  Filled 2017-09-22: qty 1

## 2017-09-22 MED ORDER — BUTALBITAL-APAP-CAFFEINE 50-325-40 MG PO TABS: 1.0000 | ORAL_TABLET | Freq: Four times a day (QID) | ORAL | 0 refills | Status: AC | PRN

## 2017-09-22 MED ORDER — IBUPROFEN 600 MG PO TABS: 600.0000 mg | ORAL_TABLET | Freq: Three times a day (TID) | ORAL | 0 refills | Status: DC | PRN

## 2017-09-22 NOTE — Discharge Instructions (Signed)
Please follow-up with your primary care physician as needed and return to the emergency department for any concerns. ° °It was a pleasure to take care of you today, and thank you for coming to our emergency department.  If you have any questions or concerns before leaving please ask the nurse to grab me and I'm more than happy to go through your aftercare instructions again. ° °If you were prescribed any opioid pain medication today such as Norco, Vicodin, Percocet, morphine, hydrocodone, or oxycodone please make sure you do not drive when you are taking this medication as it can alter your ability to drive safely. ° °If you have any concerns once you are home that you are not improving or are in fact getting worse before you can make it to your follow-up appointment, please do not hesitate to call 911 and come back for further evaluation. ° °Idali Lafever, MD ° ° °

## 2017-09-22 NOTE — ED Provider Notes (Signed)
[facility] Emergency Department Provider Note  ____________________________________________   First MD Initiated Contact with Patient 09/22/17 0001     (approximate)  I have reviewed the triage vital signs and the nursing notes.   HISTORY  Chief Complaint Headache and Hypertension    HPI Alexandra Montes is a 41 y.o. female who self presents to the emergency department with gradual onset not maximal onset right-sided throbbing headache identical to previous headaches that she has had for over 24 hours.  She is also concerned because yesterday she was begun on hydrochlorothiazide for hypertension and today she checked her blood pressure and it was 150 and then on recheck it was 160.  She denies double vision or blurred vision.  She denies chest pain or shortness of breath.  Her headache was not improved with Tylenol which prompted the visit today.  Past Medical History:  Diagnosis Date  . Breast mass   . Carpal tunnel syndrome   . Cauliflower ear, right   . Headache   . Ovarian cyst    Right    Patient Active Problem List   Diagnosis Date Noted  . Uterine leiomyoma 09/11/2017  . Slow transit constipation 09/11/2017  . Cyst of right ovary 09/11/2017  . Pelvic pain in female 09/11/2017    Past Surgical History:  Procedure Laterality Date  . NO PAST SURGERIES      Prior to Admission medications   Medication Sig Start Date End Date Taking? Authorizing Provider  butalbital-acetaminophen-caffeine (FIORICET, ESGIC) 50-325-40 MG tablet Take 1-2 tablets by mouth every 6 (six) hours as needed for headache. 09/22/17 09/22/18  Darel Hong, MD  dicyclomine (BENTYL) 20 MG tablet Take 1 tablet (20 mg total) by mouth 3 (three) times daily as needed for spasms. Patient not taking: Reported on 04/03/2017 03/27/17   Carrie Mew, MD  HYDROcodone-acetaminophen [facility]) 5-325 MG tablet Take 1 tablet by mouth 3 (three) times daily as needed. Patient not taking:  Reported on 04/03/2017 12/26/16   Menshew, Dannielle Karvonen, PA-C  ibuprofen (ADVIL,MOTRIN) 600 MG tablet Take 1 tablet (600 mg total) by mouth every 8 (eight) hours as needed. 05/18/16   Johnn Hai, PA-C  ibuprofen (ADVIL,MOTRIN) 600 MG tablet Take 1 tablet (600 mg total) by mouth every 8 (eight) hours as needed. 09/22/17   Darel Hong, MD  mupirocin ointment (BACTROBAN) 2 % Apply 1 application topically 3 (three) times daily. 06/05/17   Frederich Cha, MD    Allergies Patient has no known allergies.  Family History  Problem Relation Age of Onset  . Heart disease Mother   . Schizophrenia Father     Social History Social History  Substance Use Topics  . Smoking status: Never Smoker  . Smokeless tobacco: Never Used  . Alcohol use No    Review of Systems Constitutional: No fever/chills Eyes: No visual changes. Cardiovascular: Denies chest pain. Respiratory: Denies shortness of breath. Gastrointestinal: No abdominal pain.  No nausea, no vomiting. Musculoskeletal: Negative for back pain. Skin: Negative for rash. Neurological: Positive for headache   ____________________________________________   PHYSICAL EXAM:  VITAL SIGNS: ED Triage Vitals [09/21/17 2152]  Enc Vitals Group     BP (!) 145/71     Pulse Rate 66     Resp 18     Temp 98.4 F (36.9 C)     Temp src      SpO2 95 %     Weight 162 lb (73.5 kg)     Height 4'  11" (1.499 m)     Head Circumference      Peak Flow      Pain Score 8     Pain Loc      Pain Edu?      Excl. in [place]?     Constitutional: Alert and oriented x4 joking laughing well-appearing nontoxic no diaphoresis speaks in full clear sentences Eyes: PERRL EOMI. Head: Atraumatic. Nose: No congestion/rhinnorhea. Mouth/Throat: No trismus Neck: No stridor.   Cardiovascular: Normal rate, regular rhythm. Grossly normal heart sounds.  Good peripheral circulation. Respiratory: Normal respiratory effort.  No retractions. Lungs CTAB and moving good  air Musculoskeletal: No lower extremity edema   Neurologic:  Normal speech and language. No gross focal neurologic deficits are appreciated. Skin:  Skin is warm, dry and intact. No rash noted. Psychiatric: Mood and affect are normal. Speech and behavior are normal.    ____________________________________________   DIFFERENTIAL includes but not limited to  Asymptomatic hypertension, intracerebral hemorrhage, tension headache ____________________________________________   LABS (all labs ordered are listed, but only abnormal results are displayed)  Labs Reviewed - No data to display   __________________________________________  EKG   ____________________________________________  RADIOLOGY   ____________________________________________   PROCEDURES  Procedure(s) performed: no  Procedures  Critical Care performed: no  Observation: no ____________________________________________   INITIAL IMPRESSION / ASSESSMENT AND PLAN / ED COURSE  Pertinent labs & imaging results that were available during my care of the patient were reviewed by me and considered in my medical decision making (see chart for details).  The patient arrives well-appearing neurologically intact and her blood pressure is already back to normal.  I had a lengthy discussion with the patient regarding elevated blood pressure and that it was likely secondary to the pain from her headache and not the other way around.  She feels significant relief.  She is discharged home in improved condition verbalizes understanding      ____________________________________________   FINAL CLINICAL IMPRESSION(S) / ED DIAGNOSES  Final diagnoses:  Asymptomatic hypertension  Nonintractable headache, unspecified chronicity pattern, unspecified headache type      NEW MEDICATIONS STARTED DURING THIS VISIT:  Discharge Medication List as of 09/22/2017 12:28 AM    START taking these medications   Details   butalbital-acetaminophen-caffeine (FIORICET, ESGIC) 50-325-40 MG tablet Take 1-2 tablets by mouth every 6 (six) hours as needed for headache., Starting Fri 09/22/2017, Until Sat 09/22/2018, Print    !! ibuprofen (ADVIL,MOTRIN) 600 MG tablet Take 1 tablet (600 mg total) by mouth every 8 (eight) hours as needed., Starting Fri 09/22/2017, Print     !! - Potential duplicate medications found. Please discuss with provider.       Note:  This document was prepared using Dragon voice recognition software and may include unintentional dictation errors.     Darel Hong, MD 09/22/17 [phone]

## 2017-09-25 ENCOUNTER — Ambulatory Visit: Payer: BLUE CROSS/BLUE SHIELD | Admitting: Gastroenterology

## 2017-10-05 ENCOUNTER — Other Ambulatory Visit: Payer: BLUE CROSS/BLUE SHIELD

## 2017-10-09 ENCOUNTER — Ambulatory Visit: Payer: BLUE CROSS/BLUE SHIELD | Admitting: Gastroenterology

## 2017-10-10 ENCOUNTER — Encounter: Payer: Self-pay | Admitting: Gastroenterology

## 2017-10-10 ENCOUNTER — Ambulatory Visit: Payer: BLUE CROSS/BLUE SHIELD | Admitting: Gastroenterology

## 2017-10-10 VITALS — BP 107/66 | HR 66 | Temp 98.5°F | Ht 59.0 in | Wt 160.4 lb

## 2017-10-10 DIAGNOSIS — K5900 Constipation, unspecified: Secondary | ICD-10-CM | POA: Diagnosis not present

## 2017-10-10 NOTE — Progress Notes (Signed)
Alexandra Antigua, MD [address], [place], [place], [place], [address] [id number], [place], [place], [place], [address] Phone: [phone]  Fax: [phone]  Consultation  Referring Provider:     Rubie Maid, MD Primary Care Physician:  Patient, No Pcp Per Primary Gastroenterologist:  Virgel Manifold, MD        Reason for Consultation:    Constipation  Date of Consultation:  10/10/2017         HPI:   Alexandra Montes is a 41 y.o. female referred for chronic history of constipation.  She reports 1-2 bowel movements a week.  Has tried laxatives before but not consistently.  No blood in stool.  No weight loss.  No abdominal pain.  Reports some cramping before her bowel movements.  No family history of colon cancer.  No heartburn, no dysphagia.  Takes ibuprofen daily.  No nausea vomiting.  Is starting to eat a high-fiber diet gradually.  Had oatmeal today for breakfast.  Vitals reviewed  Past Medical History:  Diagnosis Date  . Breast mass   . Carpal tunnel syndrome   . Cauliflower ear, right   . Headache   . Ovarian cyst    Right    Past Surgical History:  Procedure Laterality Date  . NO PAST SURGERIES      Prior to Admission medications   Medication Sig Start Date End Date Taking? Authorizing Provider  butalbital-acetaminophen-caffeine (FIORICET, ESGIC) 50-325-40 MG tablet Take 1-2 tablets by mouth every 6 (six) hours as needed for headache. 09/22/17 09/22/18 Yes Darel Hong, MD  ibuprofen (ADVIL,MOTRIN) 600 MG tablet Take 1 tablet (600 mg total) by mouth every 8 (eight) hours as needed. 05/18/16  Yes Johnn Hai, PA-C  ibuprofen (ADVIL,MOTRIN) 600 MG tablet Take 1 tablet (600 mg total) by mouth every 8 (eight) hours as needed. 09/22/17  Yes Darel Hong, MD  mupirocin ointment (BACTROBAN) 2 % Apply 1 application topically 3 (three) times daily. 06/05/17  Yes Frederich Cha, MD  dicyclomine (BENTYL) 20 MG tablet Take 1 tablet (20 mg total) by mouth 3  (three) times daily as needed for spasms. Patient not taking: Reported on 04/03/2017 03/27/17   Carrie Mew, MD  HYDROcodone-acetaminophen [facility]) 5-325 MG tablet Take 1 tablet by mouth 3 (three) times daily as needed. Patient not taking: Reported on 04/03/2017 12/26/16   Menshew, Dannielle Karvonen, PA-C    Family History  Problem Relation Age of Onset  . Heart disease Mother   . Schizophrenia Father      Social History   Tobacco Use  . Smoking status: Never Smoker  . Smokeless tobacco: Never Used  Substance Use Topics  . Alcohol use: No  . Drug use: No    Allergies as of 10/10/2017  . (No Known Allergies)    Review of Systems:    All systems reviewed and negative except where noted in HPI.   Physical Exam:  Vital signs in last 24 hours: @VSRANGES @    Vitals:   10/10/17 0932  BP: 107/66  Pulse: 66  Temp: 98.5 F (36.9 C)  TempSrc: Oral  Weight: 72.8 kg (160 lb 6.4 oz)  Height: 4\' 11"  (1.499 m)   General:   Pleasant, cooperative in NAD Head:  Normocephalic and atraumatic. Eyes:   No icterus.   Conjunctiva pink. PERRLA. Ears:  Normal auditory acuity. Neck:  Supple; no masses or thyroidomegaly Lungs: Respirations even and unlabored. Lungs clear to auscultation bilaterally.   No wheezes, crackles, or rhonchi.  Heart:  Regular rate and rhythm;  Without murmur, clicks, rubs or gallops Abdomen:  Soft, nondistended, nontender. Normal bowel sounds. No appreciable masses or hepatomegaly.  No rebound or guarding.  Neurologic:  Alert and oriented x3;  grossly normal neurologically. Skin:  Intact without significant lesions or rashes. Cervical Nodes:  No significant cervical adenopathy. Psych:  Alert and cooperative. Normal affect.  LAB RESULTS: No results for input(s): WBC, HGB, HCT, PLT in the last 72 hours. BMET No results for input(s): NA, K, CL, CO2, GLUCOSE, BUN, CREATININE, CALCIUM in the last 72 hours. LFT No results for input(s): PROT, ALBUMIN, AST, ALT, ALKPHOS,  BILITOT, BILIDIR, IBILI in the last 72 hours. PT/INR No results for input(s): LABPROT, INR in the last 72 hours.  STUDIES: No results found.    Impression / Plan:   [name] is a 41 y.o. y/o female with with chronic constipation, on narcotics, with no alarm symptoms  We will start patient on MiraLAX once a day.  Educated on high-fiber diet.  Patient was asked to increase MiraLAX to 2 doses in the morning or once in the morning and once at night if she does not have a bowel movement every day or every other day with once a day dosing.  Educated on minimizing narcotics.  PCP to work on decreasing dose of narcotics long-term. Patient educated on minimizing her daily ibuprofen use too and alternating it with Tylenol educated on the side effects of peptic ulcer disease   patient agreeable with above plan NSAID use.  Will assess symptom improvement and change management based on symptoms at follow-up appointments  Thank you for involving me in the care of this patient.      Virgel Manifold, MD  10/10/2017, 9:49 AM

## 2017-10-10 NOTE — Patient Instructions (Signed)
Please use Miralax 1 to 2 times daily as needed. You can purchase this over the counter.

## 2017-11-08 ENCOUNTER — Ambulatory Visit
Admission: RE | Admit: 2017-11-08 | Discharge: 2017-11-08 | Disposition: A | Discharge status: Home / Self Care | Payer: BLUE CROSS/BLUE SHIELD | Source: Ambulatory Visit | Attending: Nurse Practitioner | Admitting: Nurse Practitioner

## 2017-11-08 DIAGNOSIS — N6002 Solitary cyst of left breast: Secondary | ICD-10-CM | POA: Diagnosis not present

## 2017-11-08 DIAGNOSIS — D241 Benign neoplasm of right breast: Secondary | ICD-10-CM | POA: Diagnosis present

## 2017-11-08 DIAGNOSIS — Z1231 Encounter for screening mammogram for malignant neoplasm of breast: Secondary | ICD-10-CM

## 2017-11-17 ENCOUNTER — Ambulatory Visit: Payer: BLUE CROSS/BLUE SHIELD | Admitting: Psychiatry

## 2017-11-20 ENCOUNTER — Encounter: Payer: Self-pay | Admitting: Psychiatry

## 2017-11-20 ENCOUNTER — Ambulatory Visit (INDEPENDENT_AMBULATORY_CARE_PROVIDER_SITE_OTHER): Payer: BLUE CROSS/BLUE SHIELD | Admitting: Psychiatry

## 2017-11-20 ENCOUNTER — Other Ambulatory Visit: Payer: Self-pay

## 2017-11-20 VITALS — BP 126/81 | HR 73 | Temp 97.8°F

## 2017-11-20 DIAGNOSIS — F4310 Post-traumatic stress disorder, unspecified: Secondary | ICD-10-CM

## 2017-11-20 MED ORDER — CITALOPRAM HYDROBROMIDE 10 MG PO TABS: 5.0000 mg | ORAL_TABLET | Freq: Every day | ORAL | 1 refills | Status: DC

## 2017-11-20 NOTE — Patient Instructions (Signed)
Citalopram tablets What is this medicine? CITALOPRAM (sye TAL oh pram) is a medicine for depression. This medicine may be used for other purposes; ask your health care provider or pharmacist if you have questions. COMMON BRAND NAME(S): Celexa What should I tell my health care provider before I take this medicine? They need to know if you have any of these conditions: -bleeding disorders -bipolar disorder or a family history of bipolar disorder -glaucoma -heart disease -history of irregular heartbeat -kidney disease -liver disease -low levels of magnesium or potassium in the blood -receiving electroconvulsive therapy -seizures -suicidal thoughts, plans, or attempt; a previous suicide attempt by you or a family member -take medicines that treat or prevent blood clots -thyroid disease -an unusual or allergic reaction to citalopram, escitalopram, other medicines, foods, dyes, or preservatives -pregnant or trying to become pregnant -breast-feeding How should I use this medicine? Take this medicine by mouth with a glass of water. Follow the directions on the prescription label. You can take it with or without food. Take your medicine at regular intervals. Do not take your medicine more often than directed. Do not stop taking this medicine suddenly except upon the advice of your doctor. Stopping this medicine too quickly may cause serious side effects or your condition may worsen. A special MedGuide will be given to you by the pharmacist with each prescription and refill. Be sure to read this information carefully each time. Talk to your pediatrician regarding the use of this medicine in children. Special care may be needed. Patients over 60 years old may have a stronger reaction and need a smaller dose. Overdosage: If you think you have taken too much of this medicine contact a poison control center or emergency room at once. NOTE: This medicine is only for you. Do not share this medicine with  others. What if I miss a dose? If you miss a dose, take it as soon as you can. If it is almost time for your next dose, take only that dose. Do not take double or extra doses. What may interact with this medicine? Do not take this medicine with any of the following medications: -certain medicines for fungal infections like fluconazole, itraconazole, ketoconazole, posaconazole, voriconazole -cisapride -dofetilide -dronedarone -escitalopram -linezolid -MAOIs like Carbex, Eldepryl, Marplan, Nardil, and Parnate -methylene blue (injected into a vein) -pimozide -thioridazine -ziprasidone This medicine may also interact with the following medications: -alcohol -amphetamines -aspirin and aspirin-like medicines -carbamazepine -certain medicines for depression, anxiety, or psychotic disturbances -certain medicines for infections like chloroquine, clarithromycin, erythromycin, furazolidone, isoniazid, pentamidine -certain medicines for migraine headaches like almotriptan, eletriptan, frovatriptan, naratriptan, rizatriptan, sumatriptan, zolmitriptan -certain medicines for sleep -certain medicines that treat or prevent blood clots like dalteparin, enoxaparin, warfarin -cimetidine -diuretics -fentanyl -lithium -methadone -metoprolol -NSAIDs, medicines for pain and inflammation, like ibuprofen or naproxen -omeprazole -other medicines that prolong the QT interval (cause an abnormal heart rhythm) -procarbazine -rasagiline -supplements like St. John's wort, kava kava, valerian -tramadol -tryptophan This list may not describe all possible interactions. Give your health care provider a list of all the medicines, herbs, non-prescription drugs, or dietary supplements you use. Also tell them if you smoke, drink alcohol, or use illegal drugs. Some items may interact with your medicine. What should I watch for while using this medicine? Tell your doctor if your symptoms do not get better or if they  get worse. Visit your doctor or health care professional for regular checks on your progress. Because it may take several weeks to see the full   effects of this medicine, it is important to continue your treatment as prescribed by your doctor. Patients and their families should watch out for new or worsening thoughts of suicide or depression. Also watch out for sudden changes in feelings such as feeling anxious, agitated, panicky, irritable, hostile, aggressive, impulsive, severely restless, overly excited and hyperactive, or not being able to sleep. If this happens, especially at the beginning of treatment or after a change in dose, call your health care professional. You may get drowsy or dizzy. Do not drive, use machinery, or do anything that needs mental alertness until you know how this medicine affects you. Do not stand or sit up quickly, especially if you are an older patient. This reduces the risk of dizzy or fainting spells. Alcohol may interfere with the effect of this medicine. Avoid alcoholic drinks. Your mouth may get dry. Chewing sugarless gum or sucking hard candy, and drinking plenty of water will help. Contact your doctor if the problem does not go away or is severe. What side effects may I notice from receiving this medicine? Side effects that you should report to your doctor or health care professional as soon as possible: -allergic reactions like skin rash, itching or hives, swelling of the face, lips, or tongue -anxious -black, tarry stools -breathing problems -changes in vision -chest pain -confusion -elevated mood, decreased need for sleep, racing thoughts, impulsive behavior -eye pain -fast, irregular heartbeat -feeling faint or lightheaded, falls -feeling agitated, angry, or irritable -hallucination, loss of contact with reality -loss of balance or coordination -loss of memory -painful or prolonged erections -restlessness, pacing, inability to keep  still -seizures -stiff muscles -suicidal thoughts or other mood changes -trouble sleeping -unusual bleeding or bruising -unusually weak or tired -vomiting Side effects that usually do not require medical attention (report to your doctor or health care professional if they continue or are bothersome): -change in appetite or weight -change in sex drive or performance -dizziness -headache -increased sweating -indigestion, nausea -tremors This list may not describe all possible side effects. Call your doctor for medical advice about side effects. You may report side effects to FDA at 1-800-FDA-1088. Where should I keep my medicine? Keep out of reach of children. Store at room temperature between 15 and 30 degrees C (59 and 86 degrees F). Throw away any unused medicine after the expiration date. NOTE: This sheet is a summary. It may not cover all possible information. If you have questions about this medicine, talk to your doctor, pharmacist, or health care provider.  2018 Elsevier/Gold Standard (2016-04-11 13:18:52)  
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
## 2017-11-30 DIAGNOSIS — F32A Depression, unspecified: Secondary | ICD-10-CM | POA: Insufficient documentation

## 2017-12-11 ENCOUNTER — Ambulatory Visit: Payer: BLUE CROSS/BLUE SHIELD | Admitting: Psychiatry

## 2017-12-14 ENCOUNTER — Encounter: Payer: Self-pay | Admitting: Psychiatry

## 2017-12-14 ENCOUNTER — Ambulatory Visit: Payer: BLUE CROSS/BLUE SHIELD | Admitting: Psychiatry

## 2017-12-14 ENCOUNTER — Other Ambulatory Visit: Payer: Self-pay

## 2017-12-14 VITALS — BP 113/77 | HR 75 | Temp 97.8°F | Wt 164.2 lb

## 2017-12-14 DIAGNOSIS — F4310 Post-traumatic stress disorder, unspecified: Secondary | ICD-10-CM

## 2017-12-14 MED ORDER — CITALOPRAM HYDROBROMIDE 10 MG PO TABS
10.0000 mg | ORAL_TABLET | Freq: Every day | ORAL | 1 refills | Status: DC
Start: 2017-12-14 — End: 2018-02-12
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
## 2018-01-02 ENCOUNTER — Ambulatory Visit: Payer: BLUE CROSS/BLUE SHIELD | Admitting: Licensed Clinical Social Worker

## 2018-01-15 ENCOUNTER — Other Ambulatory Visit: Payer: Self-pay

## 2018-01-15 ENCOUNTER — Emergency Department
Admission: EM | Admit: 2018-01-15 | Discharge: 2018-01-15 | Disposition: A | Discharge status: Home / Self Care | Payer: BLUE CROSS/BLUE SHIELD | Attending: Emergency Medicine | Admitting: Emergency Medicine

## 2018-01-15 DIAGNOSIS — R8271 Bacteriuria: Secondary | ICD-10-CM

## 2018-01-15 DIAGNOSIS — R197 Diarrhea, unspecified: Secondary | ICD-10-CM | POA: Diagnosis not present

## 2018-01-15 DIAGNOSIS — E876 Hypokalemia: Secondary | ICD-10-CM | POA: Diagnosis not present

## 2018-01-15 DIAGNOSIS — R112 Nausea with vomiting, unspecified: Secondary | ICD-10-CM | POA: Diagnosis present

## 2018-01-15 LAB — COMPREHENSIVE METABOLIC PANEL
ALK PHOS: 57 U/L (ref 38–126)
ALT: 17 U/L (ref 14–54)
AST: 20 U/L (ref 15–41)
Albumin: 3.5 g/dL (ref 3.5–5.0)
Anion gap: 5 (ref 5–15)
BUN: 21 mg/dL — AB (ref 6–20)
CALCIUM: 8.1 mg/dL — AB (ref 8.9–10.3)
CO2: 29 mmol/L (ref 22–32)
CREATININE: 0.68 mg/dL (ref 0.44–1.00)
Chloride: 101 mmol/L (ref 101–111)
Glucose, Bld: 88 mg/dL (ref 65–99)
Potassium: 3.1 mmol/L — ABNORMAL LOW (ref 3.5–5.1)
Sodium: 135 mmol/L (ref 135–145)
Total Bilirubin: 0.6 mg/dL (ref 0.3–1.2)
Total Protein: 7.4 g/dL (ref 6.5–8.1)

## 2018-01-15 LAB — URINALYSIS, COMPLETE (UACMP) WITH MICROSCOPIC
Bilirubin Urine: NEGATIVE
GLUCOSE, UA: NEGATIVE mg/dL
Hgb urine dipstick: NEGATIVE
KETONES UR: NEGATIVE mg/dL
LEUKOCYTES UA: NEGATIVE
Nitrite: NEGATIVE
PH: 7 (ref 5.0–8.0)
Protein, ur: NEGATIVE mg/dL
SPECIFIC GRAVITY, URINE: 1.021 (ref 1.005–1.030)

## 2018-01-15 LAB — INFLUENZA PANEL BY PCR (TYPE A & B)
INFLBPCR: NEGATIVE
Influenza A By PCR: NEGATIVE

## 2018-01-15 LAB — CBC
HCT: 38.7 % (ref 35.0–47.0)
Hemoglobin: 12.9 g/dL (ref 12.0–16.0)
MCH: 29.4 pg (ref 26.0–34.0)
MCHC: 33.3 g/dL (ref 32.0–36.0)
MCV: 88.5 fL (ref 80.0–100.0)
PLATELETS: 164 10*3/uL (ref 150–440)
RBC: 4.38 MIL/uL (ref 3.80–5.20)
RDW: 12.6 % (ref 11.5–14.5)
WBC: 4.1 10*3/uL (ref 3.6–11.0)

## 2018-01-15 LAB — PREGNANCY, URINE: Preg Test, Ur: NEGATIVE

## 2018-01-15 MED ORDER — ONDANSETRON 4 MG PO TBDP: 4.0000 mg | ORAL_TABLET | Freq: Three times a day (TID) | ORAL | 0 refills | Status: DC | PRN

## 2018-01-15 MED ORDER — LOPERAMIDE HCL 2 MG PO TABS: 2.0000 mg | ORAL_TABLET | Freq: Four times a day (QID) | ORAL | 0 refills | Status: DC | PRN

## 2018-01-15 MED ORDER — POTASSIUM CHLORIDE CRYS ER 20 MEQ PO TBCR
40.0000 meq | EXTENDED_RELEASE_TABLET | Freq: Once | ORAL | Status: AC
  Administered 2018-01-15: 40 meq via ORAL
  Filled 2018-01-15: qty 2

## 2018-01-15 MED ORDER — DIPHENOXYLATE-ATROPINE 2.5-0.025 MG PO TABS
1.0000 | ORAL_TABLET | Freq: Once | ORAL | Status: AC
  Administered 2018-01-15: 1 via ORAL
  Filled 2018-01-15: qty 1

## 2018-01-15 MED ORDER — ONDANSETRON 4 MG PO TBDP
4.0000 mg | ORAL_TABLET | Freq: Once | ORAL | Status: AC
  Administered 2018-01-15: 4 mg via ORAL
  Filled 2018-01-15: qty 1

## 2018-01-15 NOTE — ED Triage Notes (Signed)
Pt states vomiting and diarrhea that began 1.5 hours ago. Pt appears in no acute distress and denies pain. Pt state diarrhea x2 and emesis x4.

## 2018-01-15 NOTE — ED Notes (Signed)
Pt discharged home after verbalizing understanding of discharge instructions; nad noted. 

## 2018-01-15 NOTE — ED Notes (Signed)
Pt states that both of her upper thighs are hurting and wanted to make sure that was documented. RN notified

## 2018-01-15 NOTE — Discharge Instructions (Signed)
Drink plenty water to stay well-hydrated.  You may use Zofran for nausea and vomiting, and loperamide for diarrhea.    Return to the emergency department if you develop abdominal pain, severe pain, lightheadedness or fainting, fever, inability to keep down fluids, or any other symptoms concerning to you.

## 2018-01-15 NOTE — ED Notes (Signed)
Pt from home with diarrhea and emesis this morning. States she has been able to drink fluids but stopped when she got here. Pt denies pain, no tenderness noted upon abdominal palpation. Denies urinary symptoms. Pt alert & oriented; nad noted.

## 2018-01-15 NOTE — ED Notes (Signed)
Called lab to add on urine preg test and urine culture

## 2018-01-15 NOTE — ED Provider Notes (Addendum)
[facility] Emergency Department Provider Note  ____________________________________________  Time seen: Approximately 9:43 AM  I have reviewed the triage vital signs and the nursing notes.   HISTORY  Chief Complaint Emesis and Diarrhea    HPI Alexandra Montes is a 42 y.o. female with a history of anxiety presenting with nausea vomiting and diarrhea.  The patient reports that she ate beef and chips last night and woke up at 4 AM with multiple episodes of vomiting and loose nonbloody stool.  She has not had any abdominal discomfort dysuria, fevers or chills.  She has not had any further episodes since arriving to the emergency department.  She has not tried anything for her symptoms.  Past Medical History:  Diagnosis Date  . Anxiety   . Breast mass   . Carpal tunnel syndrome   . Cauliflower ear, right   . Headache   . Ovarian cyst    Right    Patient Active Problem List   Diagnosis Date Noted  . Uterine leiomyoma 09/11/2017  . Slow transit constipation 09/11/2017  . Cyst of right ovary 09/11/2017  . Pelvic pain in female 09/11/2017    Past Surgical History:  Procedure Laterality Date  . NO PAST SURGERIES      Current Outpatient Rx  . Order #: [id number] Class: Print  . Order #: [id number] Class: Normal  . Order #: [id number] Class: Historical Med  . Order #: [id number] Class: Historical Med  . Order #: [id number] Class: Normal  . Order #: [id number] Class: Print  . Order #: [id number] Class: Print  . Order #: [id number] Class: Print  . Order #: [id number] Class: Print  . Order #: [id number] Class: Print    Allergies Patient has no known allergies.  Family History  Problem Relation Age of Onset  . Heart disease Mother   . Bipolar disorder Mother   . Drug abuse Mother   . Alcohol abuse Mother   . Schizophrenia Father     Social History Social History   Tobacco Use  . Smoking status: Never Smoker  . Smokeless tobacco: Never Used  Substance Use  Topics  . Alcohol use: No  . Drug use: No    Review of Systems Constitutional: No fever/chills.  No lightheadedness or syncope.   Eyes: No visual changes. ENT: No sore throat. No congestion or rhinorrhea. Cardiovascular: Denies chest pain. Denies palpitations. Respiratory: Denies shortness of breath.  No cough. Gastrointestinal: No abdominal pain.  +nausea, +vomiting.  +diarrhea.  No constipation. Genitourinary: Negative for dysuria. Musculoskeletal: Negative for back pain. Skin: Negative for rash. Neurological: Negative for headaches. No focal numbness, tingling or weakness.     ____________________________________________   PHYSICAL EXAM:  VITAL SIGNS: ED Triage Vitals  Enc Vitals Group     BP 01/15/18 0630 118/69     Pulse Rate 01/15/18 0630 68     Resp 01/15/18 0630 16     Temp 01/15/18 0630 98 F (36.7 C)     Temp Source 01/15/18 0630 Oral     SpO2 01/15/18 0630 100 %     Weight 01/15/18 0631 164 lb (74.4 kg)     Height 01/15/18 0631 4\' 11"  (1.499 m)     Head Circumference --      Peak Flow --      Pain Score --      Pain Loc --      Pain Edu? --      Excl. in [place]? --     Constitutional: Alert and oriented. Well appearing and  in no acute distress. Answers questions appropriately.  Able to move about the stretcher without any difficulty.  Eyes: Conjunctivae are normal.  EOMI. No scleral icterus. Head: Atraumatic. Nose: No congestion/rhinnorhea. Mouth/Throat: Mucous membranes are moist.  Neck: No stridor.  Supple.  Chest mass. Cardiovascular: Normal rate, regular rhythm. No murmurs, rubs or gallops.  Respiratory: Normal respiratory effort.  No accessory muscle use or retractions. Lungs CTAB.  No wheezes, rales or ronchi. Gastrointestinal: Soft, nontender and nondistended.  No guarding or rebound.  No peritoneal signs. Musculoskeletal: No LE edema. Neurologic:  A&Ox3.  Speech is clear.  Face and smile are symmetric.  EOMI.  Moves all extremities well. Skin:  Skin  is warm, dry and intact. No rash noted. Psychiatric: Mood and affect are normal. Speech and behavior are normal.  Normal judgement  ____________________________________________   LABS (all labs ordered are listed, but only abnormal results are displayed)  Labs Reviewed  COMPREHENSIVE METABOLIC PANEL - Abnormal; Notable for the following components:      Result Value   Potassium 3.1 (*)    BUN 21 (*)    Calcium 8.1 (*)    All other components within normal limits  URINALYSIS, COMPLETE (UACMP) WITH MICROSCOPIC - Abnormal; Notable for the following components:   Color, Urine YELLOW (*)    APPearance CLOUDY (*)    Bacteria, UA RARE (*)    Squamous Epithelial / LPF 6-30 (*)    All other components within normal limits  CBC  INFLUENZA PANEL BY PCR (TYPE A & B)  POC URINE PREG, ED   ____________________________________________  EKG  Not indicated ____________________________________________  RADIOLOGY  No results found.  ____________________________________________   PROCEDURES  Procedure(s) performed: None  Procedures  Critical Care performed: No ____________________________________________   INITIAL IMPRESSION / ASSESSMENT AND PLAN / ED COURSE  Pertinent labs & imaging results that were available during my care of the patient were reviewed by me and considered in my medical decision making (see chart for details).  42 y.o. female with several hours of nausea vomiting and diarrhea, now resolved, after eating beef last night.  Overall, the patient is hemodynamically stable and afebrile.  Her abdominal examination is reassuring.  I do not suspect any acute intra-abdominal surgical or severe infectious pathology.  It is possible the patient has a foodborne illness, infectious GI illness, or influenza.  She is mildly hypokalemic, and I will supplement her orally.  We will plan to treat her with oral Zofran and oral rehydration.  If the patient is able to tolerate clear  liquids here, I have given her instructions about a clear liquid diet followed by a bland diet.  She will follow-up with her primary care physician.  Return precautions were discussed with both the patient and her mother.  She does have some bacteriuria with a contaminated specimen and no other evidence of UTI including no clinical symptoms, white blood cells, leukocyte esterase or nitrites.  A culture has been sent.  The patient has been counseled to follow-up with her primary care physician if she develops any symptoms. ____________________________________________  FINAL CLINICAL IMPRESSION(S) / ED DIAGNOSES  Final diagnoses:  Hypokalemia  Nausea vomiting and diarrhea         NEW MEDICATIONS STARTED DURING THIS VISIT:  New Prescriptions   LOPERAMIDE (IMODIUM A-D) 2 MG TABLET    Take 1 tablet (2 mg total) by mouth 4 (four) times daily as needed for diarrhea or loose stools.   ONDANSETRON (ZOFRAN ODT) 4 MG DISINTEGRATING  TABLET    Take 1 tablet (4 mg total) by mouth every 8 (eight) hours as needed for nausea or vomiting.      Eula Listen, MD 01/15/18 0347    Eula Listen, MD 01/15/18 [phone]

## 2018-01-16 LAB — URINE CULTURE

## 2018-01-18 LAB — POCT PREGNANCY, URINE: PREG TEST UR: NEGATIVE

## 2018-01-22 ENCOUNTER — Ambulatory Visit: Payer: BLUE CROSS/BLUE SHIELD | Admitting: Licensed Clinical Social Worker

## 2018-02-08 ENCOUNTER — Ambulatory Visit: Payer: BLUE CROSS/BLUE SHIELD | Admitting: Psychiatry

## 2018-02-12 ENCOUNTER — Ambulatory Visit: Payer: BLUE CROSS/BLUE SHIELD | Admitting: Licensed Clinical Social Worker

## 2018-02-12 ENCOUNTER — Ambulatory Visit: Payer: BLUE CROSS/BLUE SHIELD | Admitting: Psychiatry

## 2018-02-12 ENCOUNTER — Other Ambulatory Visit: Payer: Self-pay | Admitting: Psychiatry

## 2018-02-12 DIAGNOSIS — F4310 Post-traumatic stress disorder, unspecified: Secondary | ICD-10-CM

## 2018-02-12 MED ORDER — CITALOPRAM HYDROBROMIDE 10 MG PO TABS: 10.0000 mg | ORAL_TABLET | Freq: Every day | ORAL | 1 refills | Status: DC

## 2018-02-12 NOTE — Telephone Encounter (Signed)
Patient with several no-shows recently.  Based on clinic policy patient to be dismissed from this clinic due to her frequent no-shows.  Patient will be given 60-day supply of her medications.  Janett Billow CMA to send patient letter notifying her about the same.

## 2018-04-09 DIAGNOSIS — E559 Vitamin D deficiency, unspecified: Secondary | ICD-10-CM | POA: Insufficient documentation

## 2018-07-24 ENCOUNTER — Emergency Department
Admission: EM | Admit: 2018-07-24 | Discharge: 2018-07-24 | Disposition: A | Discharge status: Home / Self Care | Payer: BLUE CROSS/BLUE SHIELD | Attending: Student in an Organized Health Care Education/Training Program | Admitting: Student in an Organized Health Care Education/Training Program

## 2018-07-24 ENCOUNTER — Encounter: Payer: Self-pay | Admitting: Emergency Medicine

## 2018-07-24 ENCOUNTER — Other Ambulatory Visit: Payer: Self-pay

## 2018-07-24 DIAGNOSIS — J029 Acute pharyngitis, unspecified: Secondary | ICD-10-CM | POA: Insufficient documentation

## 2018-07-24 DIAGNOSIS — Z79899 Other long term (current) drug therapy: Secondary | ICD-10-CM | POA: Insufficient documentation

## 2018-07-24 DIAGNOSIS — R059 Cough, unspecified: Secondary | ICD-10-CM

## 2018-07-24 DIAGNOSIS — R05 Cough: Secondary | ICD-10-CM | POA: Diagnosis present

## 2018-07-24 LAB — GROUP A STREP BY PCR: GROUP A STREP BY PCR: NOT DETECTED

## 2018-07-24 MED ORDER — GUAIFENESIN-CODEINE 100-10 MG/5ML PO SYRP: 5.0000 mL | ORAL_SOLUTION | Freq: Three times a day (TID) | ORAL | 0 refills | Status: DC | PRN

## 2018-07-24 MED ORDER — AMOXICILLIN 500 MG PO CAPS
500.0000 mg | ORAL_CAPSULE | Freq: Once | ORAL | Status: AC
  Administered 2018-07-24: 500 mg via ORAL
  Filled 2018-07-24: qty 1

## 2018-07-24 MED ORDER — AMOXICILLIN 500 MG PO TABS: 500.0000 mg | ORAL_TABLET | Freq: Three times a day (TID) | ORAL | 0 refills | Status: DC

## 2018-07-24 MED ORDER — HYDROCOD POLST-CPM POLST ER 10-8 MG/5ML PO SUER
5.0000 mL | Freq: Once | ORAL | Status: AC
Start: 2018-07-24 — End: 2018-07-24
  Administered 2018-07-24: 5 mL via ORAL
  Filled 2018-07-24: qty 5

## 2018-07-24 NOTE — ED Provider Notes (Signed)
[facility] Emergency Department Provider Note  ____________________________________________  Time seen: Approximately 11:18 PM  I have reviewed the triage vital signs and the nursing notes.   HISTORY  Chief Complaint Cough   HPI CHEVETTE FEE is a 42 y.o. female who presents to the emergency department for treatment and evaluation of cough, sore throat, and congestion. Symptoms started several days ago. Cough is productive. No dysphagia. She denies fever. She has taken multiple OTC meds without relief.   Past Medical History:  Diagnosis Date  . Anxiety   . Breast mass   . Carpal tunnel syndrome   . Cauliflower ear, right   . Headache   . Ovarian cyst    Right    Patient Active Problem List   Diagnosis Date Noted  . Uterine leiomyoma 09/11/2017  . Slow transit constipation 09/11/2017  . Cyst of right ovary 09/11/2017  . Pelvic pain in female 09/11/2017    Past Surgical History:  Procedure Laterality Date  . NO PAST SURGERIES      Prior to Admission medications   Medication Sig Start Date End Date Taking? Authorizing Provider  amoxicillin (AMOXIL) 500 MG tablet Take 1 tablet (500 mg total) by mouth 3 (three) times daily. 07/24/18   Teffany Blaszczyk B, FNP  butalbital-acetaminophen-caffeine (FIORICET, ESGIC) 50-325-40 MG tablet Take 1-2 tablets by mouth every 6 (six) hours as needed for headache. 09/22/17 09/22/18  Darel Hong, MD  citalopram (CELEXA) 10 MG tablet Take 1 tablet (10 mg total) by mouth daily. 02/12/18   Ursula Alert, MD  dicyclomine (BENTYL) 20 MG tablet Take 1 tablet (20 mg total) by mouth 3 (three) times daily as needed for spasms. Patient not taking: Reported on 01/15/2018 03/27/17   Carrie Mew, MD  guaiFENesin-codeine [facility]) 100-10 MG/5ML syrup Take 5 mLs by mouth 3 (three) times daily as needed for cough. 07/24/18   Rubby Barbary B, FNP  hydrochlorothiazide (HYDRODIURIL) 25 MG tablet Take 25 mg by mouth daily.  12/14/17   [provider]  HYDROcodone-acetaminophen (NORCO) 5-325 MG tablet Take 1 tablet by mouth 3 (three) times daily as needed. Patient not taking: Reported on 01/15/2018 12/26/16   Menshew, Dannielle Karvonen, PA-C  ibuprofen (ADVIL,MOTRIN) 600 MG tablet Take 1 tablet (600 mg total) by mouth every 8 (eight) hours as needed. Patient not taking: Reported on 01/15/2018 05/18/16   Johnn Hai, PA-C  loperamide (IMODIUM A-D) 2 MG tablet Take 1 tablet (2 mg total) by mouth 4 (four) times daily as needed for diarrhea or loose stools. 01/15/18   Eula Listen, MD  losartan (COZAAR) 50 MG tablet Take 50 mg by mouth daily. 01/06/18   [provider]  mupirocin ointment (BACTROBAN) 2 % Apply 1 application topically 3 (three) times daily. 06/05/17   Frederich Cha, MD  ondansetron (ZOFRAN ODT) 4 MG disintegrating tablet Take 1 tablet (4 mg total) by mouth every 8 (eight) hours as needed for nausea or vomiting. 01/15/18   Eula Listen, MD    Allergies Patient has no known allergies.  Family History  Problem Relation Age of Onset  . Heart disease Mother   . Bipolar disorder Mother   . Drug abuse Mother   . Alcohol abuse Mother   . Schizophrenia Father     Social History Social History   Tobacco Use  . Smoking status: Never Smoker  . Smokeless tobacco: Never Used  Substance Use Topics  . Alcohol use: No  . Drug use: No  Review of Systems Constitutional: Negative for  fever/chills ENT: positive for  sore throat. Cardiovascular: Denies chest pain. Respiratory: Negative for shortness of breath. Positive for cough. Gastrointestinal: Negative for  nausea,  no vomiting.  no diarrhea.  Musculoskeletal: Negative for body aches Skin: Negative for rash. Neurological: Negative for headaches ____________________________________________   PHYSICAL EXAM:  VITAL SIGNS: ED Triage Vitals  Enc Vitals Group     BP 07/24/18 2147 128/78     Pulse Rate 07/24/18 2147  74     Resp 07/24/18 2147 19     Temp 07/24/18 2147 98.9 F (37.2 C)     Temp Source 07/24/18 2147 Oral     SpO2 07/24/18 2147 100 %     Weight 07/24/18 2147 173 lb (78.5 kg)     Height 07/24/18 2147 4\' 11"  (1.499 m)     Head Circumference --      Peak Flow --      Pain Score 07/24/18 2150 10     Pain Loc --      Pain Edu? --      Excl. in [place]? --     Constitutional: Alert and oriented. Well appearing and in no acute distress. Eyes: Conjunctivae are normal. EOMI. Ears: Bilateral TM normal Nose: no congestion noted; no rhinnorhea. Mouth/Throat: Mucous membranes are moist.  Oropharynx erythematous. Tonsils 2+. Neck: No stridor.  Lymphatic: Palpable anterior cervical lymphadenopathy. Cardiovascular: Normal rate, regular rhythm. Good peripheral circulation. Respiratory: Normal respiratory effort.  No retractions. Breath sounds clear to auscultation. Gastrointestinal: Soft and nontender.  Musculoskeletal: FROM x 4 extremities.  Neurologic:  Normal speech and language.  Skin:  Skin is warm, dry and intact. No rash noted. Psychiatric: Mood and affect are normal. Speech and behavior are normal.  ____________________________________________   LABS (all labs ordered are listed, but only abnormal results are displayed)  Labs Reviewed  GROUP A STREP BY PCR   ____________________________________________  EKG  Not indicated. ____________________________________________  RADIOLOGY  Not indicated. ____________________________________________   PROCEDURES  Procedure(s) performed: None  Critical Care performed: No ____________________________________________   INITIAL IMPRESSION / ASSESSMENT AND PLAN / ED COURSE  42 y.o. female who presents to the emergency department for treatment and evaluation of cough and sore throat.  Cough started several days ago with sore throat started getting worse yesterday.  Should be treated with amoxicillin and Robitussin-AC.  She was  encouraged to follow-up with her primary care provider for symptoms that are not improving over the next couple of days.  She was advised to return to the emergency department for symptoms of change or worsen if unable to schedule an appointment.    Medications  amoxicillin (AMOXIL) capsule 500 mg (has no administration in time range)  chlorpheniramine-HYDROcodone (TUSSIONEX) 10-8 MG/5ML suspension 5 mL (has no administration in time range)    ED Discharge Orders         Ordered    amoxicillin (AMOXIL) 500 MG tablet  3 times daily     07/24/18 2330    guaiFENesin-codeine (ROBITUSSIN AC) 100-10 MG/5ML syrup  3 times daily PRN     07/24/18 2330           Pertinent labs & imaging results that were available during my care of the patient were reviewed by me and considered in my medical decision making (see chart for details).    If controlled substance prescribed during this visit, 12 month history viewed on the [place] prior to issuing an initial prescription for Schedule II  or III opiod. ____________________________________________   FINAL CLINICAL IMPRESSION(S) / ED DIAGNOSES  Final diagnoses:  Cough  Pharyngitis, unspecified etiology    Note:  This document was prepared using Dragon voice recognition software and may include unintentional dictation errors.     Victorino Dike, FNP 07/24/18 2333    Merlyn Lot, MD 07/24/18 [phone]

## 2018-07-24 NOTE — ED Triage Notes (Signed)
Patient ambulatory to triage with steady gait, without difficulty or distress noted; pt reports sore throat, cough, congestion since last week

## 2018-08-14 ENCOUNTER — Other Ambulatory Visit: Payer: Self-pay | Admitting: Nurse Practitioner

## 2018-08-14 DIAGNOSIS — J309 Allergic rhinitis, unspecified: Secondary | ICD-10-CM | POA: Insufficient documentation

## 2018-08-14 DIAGNOSIS — Z1231 Encounter for screening mammogram for malignant neoplasm of breast: Secondary | ICD-10-CM

## 2018-08-27 ENCOUNTER — Encounter: Payer: Self-pay | Admitting: *Deleted

## 2018-08-27 ENCOUNTER — Emergency Department
Admission: EM | Admit: 2018-08-27 | Discharge: 2018-08-27 | Disposition: A | Discharge status: Home / Self Care | Payer: BLUE CROSS/BLUE SHIELD | Attending: Emergency Medicine | Admitting: Emergency Medicine

## 2018-08-27 ENCOUNTER — Other Ambulatory Visit: Payer: Self-pay

## 2018-08-27 DIAGNOSIS — Y929 Unspecified place or not applicable: Secondary | ICD-10-CM | POA: Diagnosis not present

## 2018-08-27 DIAGNOSIS — S61213A Laceration without foreign body of left middle finger without damage to nail, initial encounter: Secondary | ICD-10-CM | POA: Insufficient documentation

## 2018-08-27 DIAGNOSIS — Y998 Other external cause status: Secondary | ICD-10-CM | POA: Insufficient documentation

## 2018-08-27 DIAGNOSIS — W268XXA Contact with other sharp object(s), not elsewhere classified, initial encounter: Secondary | ICD-10-CM | POA: Diagnosis not present

## 2018-08-27 DIAGNOSIS — Y939 Activity, unspecified: Secondary | ICD-10-CM | POA: Insufficient documentation

## 2018-08-27 DIAGNOSIS — Z79899 Other long term (current) drug therapy: Secondary | ICD-10-CM | POA: Diagnosis not present

## 2018-08-27 NOTE — ED Provider Notes (Signed)
[facility] Emergency Department Provider Note  ____________________________________________  Time seen: Approximately 11:44 PM  I have reviewed the triage vital signs and the nursing notes.   HISTORY  Chief Complaint Laceration    HPI Alexandra Montes is a 42 y.o. female presents to the emergency department with a 1 cm, superficial laceration along the dorsal aspect of the left third finger sustained accidentally with a broken plate.  Patient denies numbness and tingling of the left hand.  Her tetanus status is up-to-date.   Past Medical History:  Diagnosis Date  . Anxiety   . Breast mass   . Carpal tunnel syndrome   . Cauliflower ear, right   . Headache   . Ovarian cyst    Right    Patient Active Problem List   Diagnosis Date Noted  . Uterine leiomyoma 09/11/2017  . Slow transit constipation 09/11/2017  . Cyst of right ovary 09/11/2017  . Pelvic pain in female 09/11/2017    Past Surgical History:  Procedure Laterality Date  . NO PAST SURGERIES      Prior to Admission medications   Medication Sig Start Date End Date Taking? Authorizing Provider  amoxicillin (AMOXIL) 500 MG tablet Take 1 tablet (500 mg total) by mouth 3 (three) times daily. 07/24/18   Triplett, Cari B, FNP  butalbital-acetaminophen-caffeine (FIORICET, ESGIC) 50-325-40 MG tablet Take 1-2 tablets by mouth every 6 (six) hours as needed for headache. 09/22/17 09/22/18  Darel Hong, MD  citalopram (CELEXA) 10 MG tablet Take 1 tablet (10 mg total) by mouth daily. 02/12/18   Ursula Alert, MD  dicyclomine (BENTYL) 20 MG tablet Take 1 tablet (20 mg total) by mouth 3 (three) times daily as needed for spasms. Patient not taking: Reported on 01/15/2018 03/27/17   Carrie Mew, MD  guaiFENesin-codeine [facility]) 100-10 MG/5ML syrup Take 5 mLs by mouth 3 (three) times daily as needed for cough. 07/24/18   Triplett, Cari B, FNP  hydrochlorothiazide (HYDRODIURIL) 25 MG tablet Take 25 mg  by mouth daily. 12/14/17   [provider]  HYDROcodone-acetaminophen (NORCO) 5-325 MG tablet Take 1 tablet by mouth 3 (three) times daily as needed. Patient not taking: Reported on 01/15/2018 12/26/16   Menshew, Dannielle Karvonen, PA-C  ibuprofen (ADVIL,MOTRIN) 600 MG tablet Take 1 tablet (600 mg total) by mouth every 8 (eight) hours as needed. Patient not taking: Reported on 01/15/2018 05/18/16   Johnn Hai, PA-C  loperamide (IMODIUM A-D) 2 MG tablet Take 1 tablet (2 mg total) by mouth 4 (four) times daily as needed for diarrhea or loose stools. 01/15/18   Eula Listen, MD  losartan (COZAAR) 50 MG tablet Take 50 mg by mouth daily. 01/06/18   [provider]  mupirocin ointment (BACTROBAN) 2 % Apply 1 application topically 3 (three) times daily. 06/05/17   Frederich Cha, MD  ondansetron (ZOFRAN ODT) 4 MG disintegrating tablet Take 1 tablet (4 mg total) by mouth every 8 (eight) hours as needed for nausea or vomiting. 01/15/18   Eula Listen, MD    Allergies Patient has no known allergies.  Family History  Problem Relation Age of Onset  . Heart disease Mother   . Bipolar disorder Mother   . Drug abuse Mother   . Alcohol abuse Mother   . Schizophrenia Father     Social History Social History   Tobacco Use  . Smoking status: Never Smoker  . Smokeless tobacco: Never Used  Substance Use Topics  . Alcohol use: No  .  Drug use: No     Review of Systems  Constitutional: No fever/chills Eyes: No visual changes. No discharge ENT: No upper respiratory complaints. Cardiovascular: no chest pain. Respiratory: no cough. No SOB. Gastrointestinal: No abdominal pain.  No nausea, no vomiting.  No diarrhea.  No constipation. Musculoskeletal: Negative for musculoskeletal pain. Skin: Patient has laceration. Neurological: Negative for headaches, focal weakness or numbness.   ____________________________________________   PHYSICAL EXAM:  VITAL SIGNS: ED Triage  Vitals [08/27/18 2159]  Enc Vitals Group     BP 121/74     Pulse Rate 78     Resp 20     Temp 98.8 F (37.1 C)     Temp Source Oral     SpO2 98 %     Weight 174 lb (78.9 kg)     Height 4\' 11"  (1.499 m)     Head Circumference      Peak Flow      Pain Score 8     Pain Loc      Pain Edu?      Excl. in [place]?      Constitutional: Alert and oriented. Well appearing and in no acute distress. Eyes: Conjunctivae are normal. PERRL. EOMI. Head: Atraumatic. Cardiovascular: Normal rate, regular rhythm. Normal S1 and S2.  Good peripheral circulation. Respiratory: Normal respiratory effort without tachypnea or retractions. Lungs CTAB. Good air entry to the bases with no decreased or absent breath sounds. Musculoskeletal: Full range of motion to all extremities. No gross deformities appreciated. Neurologic:  Normal speech and language. No gross focal neurologic deficits are appreciated.  Skin: Patient has a 1 cm superficial laceration to the dorsal aspect of the left third digit. Psychiatric: Mood and affect are normal. Speech and behavior are normal. Patient exhibits appropriate insight and judgement.   ____________________________________________   LABS (all labs ordered are listed, but only abnormal results are displayed)  Labs Reviewed - No data to display ____________________________________________  EKG   ____________________________________________  RADIOLOGY   No results found.  ____________________________________________    PROCEDURES  Procedure(s) performed:    Procedures   LACERATION REPAIR Performed by: Lannie Fields Authorized by: Lannie Fields Consent: Verbal consent obtained. Risks and benefits: risks, benefits and alternatives were discussed Consent given by: patient Patient identity confirmed: provided demographic data Prepped and Draped in normal sterile fashion Wound explored  Laceration Location: Left 3rd digit.   Laceration Length: 1  cm  No Foreign Bodies seen or palpated  Anesthesia: None   Irrigation method: syringe Amount of cleaning: standard  Skin closure: Dermabond   Patient tolerance: Patient tolerated the procedure well with no immediate complications.  Medications - No data to display   ____________________________________________   INITIAL IMPRESSION / ASSESSMENT AND PLAN / ED COURSE  Pertinent labs & imaging results that were available during my care of the patient were reviewed by me and considered in my medical decision making (see chart for details).  Review of the [place] CSRS was performed in accordance of the [place] prior to dispensing any controlled drugs.    Assessment and plan Left third digit laceration Patient presents to the emergency department with a 1 cm laceration along the dorsal aspect of the left third digit repaired in the emergency department with Dermabond.  Patient's finger was also splinted.  She was advised to follow-up with primary care as needed.  All patient questions were answered.  ____________________________________________  FINAL CLINICAL IMPRESSION(S) / ED DIAGNOSES  Final diagnoses:  Laceration of left  middle finger without foreign body without damage to nail, initial encounter      NEW MEDICATIONS STARTED DURING THIS VISIT:  ED Discharge Orders    None          This chart was dictated using voice recognition software/Dragon. Despite best efforts to proofread, errors can occur which can change the meaning. Any change was purely unintentional.    Lannie Fields, PA-C 08/27/18 2346    Carrie Mew, MD 08/30/18 1550

## 2018-08-27 NOTE — ED Triage Notes (Signed)
Pt has laceration to left 3rd finger. Cut finger with a plate.  Bleeding controlled.

## 2018-11-20 DIAGNOSIS — F4310 Post-traumatic stress disorder, unspecified: Secondary | ICD-10-CM | POA: Insufficient documentation

## 2018-11-30 ENCOUNTER — Ambulatory Visit
Admission: RE | Admit: 2018-11-30 | Discharge: 2018-11-30 | Disposition: A | Discharge status: Home / Self Care | Payer: BLUE CROSS/BLUE SHIELD | Source: Ambulatory Visit | Attending: Nurse Practitioner | Admitting: Nurse Practitioner

## 2018-11-30 DIAGNOSIS — Z1231 Encounter for screening mammogram for malignant neoplasm of breast: Secondary | ICD-10-CM | POA: Diagnosis present

## 2019-01-27 ENCOUNTER — Other Ambulatory Visit: Payer: Self-pay

## 2019-01-27 DIAGNOSIS — R1084 Generalized abdominal pain: Secondary | ICD-10-CM | POA: Insufficient documentation

## 2019-01-27 DIAGNOSIS — Z5321 Procedure and treatment not carried out due to patient leaving prior to being seen by health care provider: Secondary | ICD-10-CM | POA: Insufficient documentation

## 2019-01-27 LAB — COMPREHENSIVE METABOLIC PANEL
ALBUMIN: 3.7 g/dL (ref 3.5–5.0)
ALT: 55 U/L — AB (ref 0–44)
AST: 128 U/L — AB (ref 15–41)
Alkaline Phosphatase: 56 U/L (ref 38–126)
Anion gap: 6 (ref 5–15)
BUN: 18 mg/dL (ref 6–20)
CHLORIDE: 108 mmol/L (ref 98–111)
CO2: 28 mmol/L (ref 22–32)
CREATININE: 0.7 mg/dL (ref 0.44–1.00)
Calcium: 8.5 mg/dL — ABNORMAL LOW (ref 8.9–10.3)
GFR calc non Af Amer: >60 mL/min (ref >60)
Glucose, Bld: 102 mg/dL — ABNORMAL HIGH (ref 70–99)
Potassium: 2.9 mmol/L — ABNORMAL LOW (ref 3.5–5.1)
SODIUM: 142 mmol/L (ref 135–145)
Total Bilirubin: 1.2 mg/dL (ref 0.3–1.2)
Total Protein: 7.1 g/dL (ref 6.5–8.1)

## 2019-01-27 LAB — URINALYSIS, COMPLETE (UACMP) WITH MICROSCOPIC
BILIRUBIN URINE: NEGATIVE
GLUCOSE, UA: NEGATIVE mg/dL
KETONES UR: NEGATIVE mg/dL
LEUKOCYTE UA: NEGATIVE
NITRITE: NEGATIVE
PH: 6 (ref 5.0–8.0)
Protein, ur: NEGATIVE mg/dL
SPECIFIC GRAVITY, URINE: 1.012 (ref 1.005–1.030)

## 2019-01-27 LAB — CBC
HEMATOCRIT: 38.6 % (ref 36.0–46.0)
Hemoglobin: 12.3 g/dL (ref 12.0–15.0)
MCH: 28.8 pg (ref 26.0–34.0)
MCHC: 31.9 g/dL (ref 30.0–36.0)
MCV: 90.4 fL (ref 80.0–100.0)
NRBC: 0 % (ref 0.0–0.2)
Platelets: 210 10*3/uL (ref 150–400)
RBC: 4.27 MIL/uL (ref 3.87–5.11)
RDW: 12.6 % (ref 11.5–15.5)
WBC: 7.3 10*3/uL (ref 4.0–10.5)

## 2019-01-27 LAB — LIPASE, BLOOD: LIPASE: 29 U/L (ref 11–51)

## 2019-01-27 LAB — POCT PREGNANCY, URINE: Preg Test, Ur: NEGATIVE

## 2019-01-27 NOTE — ED Triage Notes (Signed)
Pt states generalized abd pain and back pain that began today with 2 episodes of vomiting. A&O, ambulatory. No distress noted. States "last time this happened it was real bad acid reflux." denies taking any medications for reflux.

## 2019-01-28 ENCOUNTER — Emergency Department
Admission: EM | Admit: 2019-01-28 | Discharge: 2019-01-28 | Disposition: A | Discharge status: Home / Self Care | Payer: BLUE CROSS/BLUE SHIELD | Attending: Emergency Medicine | Admitting: Emergency Medicine

## 2019-01-28 ENCOUNTER — Telehealth: Payer: Self-pay | Admitting: Emergency Medicine

## 2019-01-28 NOTE — Telephone Encounter (Signed)
Called patient due to lwot to inquire about condition and follow up plans. Left message.   

## 2019-06-28 ENCOUNTER — Other Ambulatory Visit: Payer: Self-pay

## 2019-06-28 ENCOUNTER — Inpatient Hospital Stay: Payer: BC Managed Care – PPO | Attending: Oncology | Admitting: Oncology

## 2019-06-28 ENCOUNTER — Encounter: Payer: Self-pay | Admitting: Oncology

## 2019-06-28 ENCOUNTER — Inpatient Hospital Stay: Payer: BC Managed Care – PPO

## 2019-06-28 VITALS — BP 106/66 | HR 68 | Temp 98.0°F | Resp 16 | Ht 59.0 in | Wt 172.6 lb

## 2019-06-28 DIAGNOSIS — I82412 Acute embolism and thrombosis of left femoral vein: Secondary | ICD-10-CM

## 2019-06-28 DIAGNOSIS — I10 Essential (primary) hypertension: Secondary | ICD-10-CM | POA: Diagnosis not present

## 2019-06-28 DIAGNOSIS — M79605 Pain in left leg: Secondary | ICD-10-CM | POA: Insufficient documentation

## 2019-06-28 DIAGNOSIS — Z79899 Other long term (current) drug therapy: Secondary | ICD-10-CM | POA: Insufficient documentation

## 2019-06-28 DIAGNOSIS — Z7901 Long term (current) use of anticoagulants: Secondary | ICD-10-CM | POA: Insufficient documentation

## 2019-06-28 LAB — COMPREHENSIVE METABOLIC PANEL
ALT: 13 U/L (ref 0–44)
AST: 13 U/L — ABNORMAL LOW (ref 15–41)
Albumin: 3.9 g/dL (ref 3.5–5.0)
Alkaline Phosphatase: 52 U/L (ref 38–126)
Anion gap: 8 (ref 5–15)
BUN: 14 mg/dL (ref 6–20)
CO2: 31 mmol/L (ref 22–32)
Calcium: 8.9 mg/dL (ref 8.9–10.3)
Chloride: 102 mmol/L (ref 98–111)
Creatinine, Ser: 0.77 mg/dL (ref 0.44–1.00)
GFR calc Af Amer: >60 mL/min (ref >60)
GFR calc non Af Amer: >60 mL/min (ref >60)
Glucose, Bld: 99 mg/dL (ref 70–99)
Potassium: 3.4 mmol/L — ABNORMAL LOW (ref 3.5–5.1)
Sodium: 141 mmol/L (ref 135–145)
Total Bilirubin: 0.8 mg/dL (ref 0.3–1.2)
Total Protein: 7.8 g/dL (ref 6.5–8.1)

## 2019-06-28 LAB — CBC WITH DIFFERENTIAL/PLATELET
Abs Immature Granulocytes: 0.02 10*3/uL (ref 0.00–0.07)
Basophils Absolute: 0 10*3/uL (ref 0.0–0.1)
Basophils Relative: 1 %
Eosinophils Absolute: 0 10*3/uL (ref 0.0–0.5)
Eosinophils Relative: 0 %
HCT: 35.3 % — ABNORMAL LOW (ref 36.0–46.0)
Hemoglobin: 11.5 g/dL — ABNORMAL LOW (ref 12.0–15.0)
Immature Granulocytes: 0 %
Lymphocytes Relative: 22 %
Lymphs Abs: 1.4 10*3/uL (ref 0.7–4.0)
MCH: 29 pg (ref 26.0–34.0)
MCHC: 32.6 g/dL (ref 30.0–36.0)
MCV: 89.1 fL (ref 80.0–100.0)
Monocytes Absolute: 0.3 10*3/uL (ref 0.1–1.0)
Monocytes Relative: 5 %
Neutro Abs: 4.5 10*3/uL (ref 1.7–7.7)
Neutrophils Relative %: 72 %
Platelets: 204 10*3/uL (ref 150–400)
RBC: 3.96 MIL/uL (ref 3.87–5.11)
RDW: 12.3 % (ref 11.5–15.5)
WBC: 6.3 10*3/uL (ref 4.0–10.5)
nRBC: 0 % (ref 0.0–0.2)

## 2019-06-28 NOTE — Progress Notes (Signed)
Patient here today for new consult for DVT in left leg.  Patient c/o left leg pain.

## 2019-06-29 ENCOUNTER — Encounter: Payer: Self-pay | Admitting: Oncology

## 2019-06-29 DIAGNOSIS — M79605 Pain in left leg: Secondary | ICD-10-CM | POA: Insufficient documentation

## 2019-06-29 DIAGNOSIS — I82412 Acute embolism and thrombosis of left femoral vein: Secondary | ICD-10-CM | POA: Insufficient documentation

## 2019-06-29 DIAGNOSIS — M79609 Pain in unspecified limb: Secondary | ICD-10-CM | POA: Insufficient documentation

## 2019-06-29 NOTE — Progress Notes (Signed)
Hematology/Oncology Consult note [facility] [phone] [phone]   Patient Care Team: Danelle Berry, NP as PCP - General (Nurse Practitioner)  REFERRING PROVIDER: Danelle Berry, NP  CHIEF COMPLAINTS/REASON FOR VISIT:  Evaluation of DVT, anticoagulation disorder  HISTORY OF PRESENTING ILLNESS:   Alexandra Montes is a  43 y.o.  female with PMH listed below was seen in consultation at the request of  Danelle Berry, NP  for evaluation of DVT, anticoagulation disorder Extensive medical record review was performed. In April 2020 patient presented to primary care provider with complaints of left lower extremity pain. 03/08/2019 left lower extremity duplex venous showed thrombus in the superficial femoral vein distal segment. Patient was started on anticoagulation with Xarelto starter pack. Reports tolerating anticoagulation.  Currently on Xarelto 20 mg daily. Denies hematochezia, hematuria, hematemesis, epistaxis, black tarry stool or easy bruising.  She denies any previous history of DVT, reports positive family history of blood clots Denies any immobilization factors prior to the diagnosis of left lower extremity DVT.  05/03/2019 right lower extremity duplex venous Showed no evidence of DVT.  Patient continues to have intermittent burning sensation of the left lower extremity, exacerbated with movement.  Lab work-up done at [facility] medical 02/22/2019 creatinine 0.7, bilirubin 0.6, normal AST ALT CBC showed WBC 5, hemoglobin 11.7, hematocrit 34.7, MCV 87, platelet 202,000 TSH 1.67   Review of Systems  Constitutional: Negative for appetite change, chills, fatigue and fever.  HENT:   Negative for hearing loss and voice change.   Eyes: Negative for eye problems.  Respiratory: Negative for chest tightness and cough.   Cardiovascular: Negative for chest pain.  Gastrointestinal: Negative for abdominal distention, abdominal pain and  blood in stool.  Endocrine: Negative for hot flashes.  Genitourinary: Negative for difficulty urinating and frequency.   Musculoskeletal: Negative for arthralgias.       Left lower extremity intermittent burning sensation and pain.  Skin: Negative for itching and rash.  Neurological: Negative for extremity weakness.  Hematological: Negative for adenopathy.  Psychiatric/Behavioral: Negative for confusion.    MEDICAL HISTORY:  Past Medical History:  Diagnosis Date   Anxiety    Breast mass    Carpal tunnel syndrome    Cauliflower ear, right    DVT of deep femoral vein, left (HCC)    GERD (gastroesophageal reflux disease)    Headache    Hypertension    Ovarian cyst    Right    SURGICAL HISTORY: Past Surgical History:  Procedure Laterality Date   AMPUTATION FINGER     AMPUTATION TOE     NO PAST SURGERIES      SOCIAL HISTORY: Social History   Socioeconomic History   Marital status: Legally Separated    Spouse name: Not on file   Number of children: 0   Years of education: Not on file   Highest education level: GED or equivalent  Occupational History   Not on file  Social Needs   Financial resource strain: Somewhat hard   Food insecurity    Worry: Never true    Inability: Never true   Transportation needs    Medical: No    Non-medical: No  Tobacco Use   Smoking status: Never Smoker   Smokeless tobacco: Never Used  Substance and Sexual Activity   Alcohol use: No   Drug use: No   Sexual activity: Yes    Birth control/protection: None  Lifestyle   Physical activity    Days per week:  0 days    Minutes per session: 0 min   Stress: Not on file  Relationships   Social connections    Talks on phone: Not on file    Gets together: Not on file    Attends religious service: Never    Active member of club or organization: No    Attends meetings of clubs or organizations: Never    Relationship status: Separated   Intimate partner  violence    Fear of current or ex partner: No    Emotionally abused: No    Physically abused: No    Forced sexual activity: No  Other Topics Concern   Not on file  Social History Narrative   Not on file    FAMILY HISTORY: Family History  Problem Relation Age of Onset   Heart disease Mother    Bipolar disorder Mother    Drug abuse Mother    Alcohol abuse Mother    Breast cancer Mother    Schizophrenia Father    Lung cancer Maternal Grandfather     ALLERGIES:  has No Known Allergies.  MEDICATIONS:  Current Outpatient Medications  Medication Sig Dispense Refill   acetaminophen (TYLENOL) 325 MG tablet Take 650 mg by mouth 4 (four) times daily.     albuterol (PROAIR HFA) 108 (90 Base) MCG/ACT inhaler ProAir HFA 108 (90 Base) MCG/ACT Inhalation Aerosol Solution QTY: 1 inhaler Days: 30 Refills: 1  Written: 02/27/18 Patient Instructions: Inhale 1 puff orally every 6 hours as needed for shortness of breath     citalopram (CELEXA) 10 MG tablet Take 1 tablet (10 mg total) by mouth daily. 30 tablet 1   ergocalciferol (VITAMIN D2) 1.25 MG (50000 UT) capsule Ergocalciferol 1.25 MG (50000 UT) Oral Capsule QTY: 12 capsule Days: 84 Refills: 3  Written: 03/14/19 Patient Instructions: Take 1 capsule by mouth WEEKLY     fluticasone (FLONASE) 50 MCG/ACT nasal spray Fluticasone Propionate 50 MCG/ACT Nasal Suspension QTY: 1 bottle Days: 30 Refills: 4  Written: 02/19/18 Patient Instructions: Lean head forward, instill 1 spray in each nostril daily with a gentle sniff     gabapentin (NEURONTIN) 100 MG capsule Gabapentin 100 MG Oral Capsule QTY: 180 capsule Days: 30 Refills: 2  Written: 06/14/19 Patient Instructions: Take 1 capsule by mouth daily in AM and 2 caps at noon and 3 caps at bedtime for nerve related pain     hydrochlorothiazide (HYDRODIURIL) 25 MG tablet Take 25 mg by mouth daily.  0   loratadine (CLARITIN) 10 MG tablet TAKE 1 TABLET BY MOUTH ONCE DAILY AS NEEDED FOR SEASONAL  ALLERGY SYMPTOMS.     losartan (COZAAR) 50 MG tablet Take 50 mg by mouth daily.  1   mupirocin ointment (BACTROBAN) 2 % Apply 1 application topically 3 (three) times daily. 22 g 0   omeprazole (PRILOSEC) 40 MG capsule Omeprazole 40 MG Oral Capsule Delayed Release QTY: 30 capsule Days: 30 Refills: 0  Written: 02/26/19 Patient Instructions: Take 1 capsule by mouth daily in AM 30 min prior to breakfast x 1 month     rivaroxaban (XARELTO) 20 MG TABS tablet Xarelto 20 MG Oral Tablet QTY: 30 tablet Days: 30 Refills: 3  Written: 05/05/19 Patient Instructions: TAKE 1 TABLET BY MOUTH ONCE DAILY AT SAME TIME EACH DAY     sucralfate (CARAFATE) 1 g tablet Carafate 1 GM Oral Tablet QTY: 120 tablet Days: 30 Refills: 0  Written: 02/26/19 Patient Instructions: Take 1 tablet by mouth four times daily after meals (dissolve in  3 tablespoons of water)     traMADol (ULTRAM) 50 MG tablet traMADol HCl 50 MG Oral Tablet QTY: 20 tablet Days: 5 Refills: 0  Written: 06/14/19 Patient Instructions: Take one tablet by mouth every 6 hours as needed     No current facility-administered medications for this visit.      PHYSICAL EXAMINATION: ECOG PERFORMANCE STATUS: 1 - Symptomatic but completely ambulatory Vitals:   06/28/19 1408  BP: 106/66  Pulse: 68  Resp: 16  Temp: 98 F (36.7 C)   Filed Weights   06/28/19 1408  Weight: 172 lb 9 oz (78.3 kg)    Physical Exam Constitutional:      General: She is not in acute distress. HENT:     Head: Normocephalic and atraumatic.  Eyes:     General: No scleral icterus.    Pupils: Pupils are equal, round, and reactive to light.  Neck:     Musculoskeletal: Normal range of motion and neck supple.  Cardiovascular:     Rate and Rhythm: Normal rate and regular rhythm.     Heart sounds: Normal heart sounds.  Pulmonary:     Effort: Pulmonary effort is normal. No respiratory distress.     Breath sounds: No wheezing.  Abdominal:     General: Bowel sounds are normal. There  is no distension.     Palpations: Abdomen is soft. There is no mass.     Tenderness: There is no abdominal tenderness.  Musculoskeletal: Normal range of motion.        General: No deformity.  Skin:    General: Skin is warm and dry.     Findings: No erythema or rash.  Neurological:     Mental Status: She is alert and oriented to person, place, and time.     Cranial Nerves: No cranial nerve deficit.     Coordination: Coordination normal.  Psychiatric:        Behavior: Behavior normal.        Thought Content: Thought content normal.     Comments: Anxious      LABORATORY DATA:  I have reviewed the data as listed Lab Results  Component Value Date   WBC 6.3 06/28/2019   HGB 11.5 (L) 06/28/2019   HCT 35.3 (L) 06/28/2019   MCV 89.1 06/28/2019   PLT 204 06/28/2019   Recent Labs    01/27/19 1806 06/28/19 1501  NA 142 141  K 2.9* 3.4*  CL 108 102  CO2 28 31  GLUCOSE 102* 99  BUN 18 14  CREATININE 0.70 0.77  CALCIUM 8.5* 8.9  GFRNONAA >60 >60  GFRAA >60 >60  PROT 7.1 7.8  ALBUMIN 3.7 3.9  AST 128* 13*  ALT 55* 13  ALKPHOS 56 52  BILITOT 1.2 0.8   Iron/TIBC/Ferritin/ %Sat No results found for: IRON, TIBC, FERRITIN, IRONPCTSAT    RADIOGRAPHIC STUDIES: I have personally reviewed the radiological images as listed and agreed with the findings in the report.  No results found.    ASSESSMENT & PLAN:  1. Acute deep vein thrombosis (DVT) of femoral vein of left lower extremity (HCC)   2. Pain of left lower extremity    Labs and previous imaging studies results reviewed and discussed with patient. Unprovoked acute left lower extremity proximal DVT. Agree with anticoagulation with Xarelto 20 mg daily. She appears tolerating anticoagulation very well no bleeding events. Given the unprovoked features and being proximal in nature, I recommend long-term anticoagulation. Check factor V Leiden mutation and prothrombin mutation  I will hold rest of hypercoagulable work-up at  this point. Recommend patient to complete 6 months of anticoagulation.  Temporarily hold Xarelto and then proceed with additional hypercoagulable work-up at that point. She voices understanding and agree with the plan. June 2020 repeat lower extremity venous Doppler was on right lower extremity. Per PCPs note, there is another left lower extremity venous Doppler.  We will check with PCPs office to obtain additional results.  Intermittent left lower extremity pain and burning sensation, patient has appointment to see orthopedic surgeon to rule out radiculopathy. Other etiology includes peripheral arterial disease.   Orders Placed This Encounter  Procedures   Factor 5 leiden    Standing Status:   Future    Number of Occurrences:   1    Standing Expiration Date:   06/27/2020   Prothrombin gene mutation    Standing Status:   Future    Number of Occurrences:   1    Standing Expiration Date:   06/27/2020   CBC with Differential/Platelet    Standing Status:   Future    Number of Occurrences:   1    Standing Expiration Date:   06/27/2020   Comprehensive metabolic panel    Standing Status:   Future    Number of Occurrences:   1    Standing Expiration Date:   06/27/2020    All questions were answered. The patient knows to call the clinic with any problems questions or concerns.  cc Danelle Berry, NP    Return of visit: 3 months Thank you for this kind referral and the opportunity to participate in the care of this patient. A copy of today's note is routed to referring provider  Total face to face encounter time for this patient visit was 45 min. >50% of the time was  spent in counseling and coordination of care.    Earlie Server, MD, PhD Hematology Oncology [facility] Pager- [phone] 06/29/2019

## 2019-07-03 LAB — FACTOR 5 LEIDEN

## 2019-07-05 LAB — PROTHROMBIN GENE MUTATION

## 2019-09-30 ENCOUNTER — Telehealth: Payer: Self-pay | Admitting: Oncology

## 2019-09-30 ENCOUNTER — Inpatient Hospital Stay: Payer: BC Managed Care – PPO | Admitting: Oncology

## 2019-09-30 ENCOUNTER — Inpatient Hospital Stay: Payer: BC Managed Care – PPO | Attending: Oncology

## 2019-09-30 NOTE — Telephone Encounter (Signed)
L/M to try and R/S NS appt-tg

## 2019-10-01 ENCOUNTER — Telehealth: Payer: Self-pay | Admitting: Oncology

## 2019-10-01 NOTE — Telephone Encounter (Signed)
LM for PT to try and R/S NS appt-ltg

## 2019-10-08 ENCOUNTER — Inpatient Hospital Stay: Payer: BC Managed Care – PPO

## 2019-10-08 ENCOUNTER — Inpatient Hospital Stay: Payer: BC Managed Care – PPO | Admitting: Oncology

## 2019-10-09 ENCOUNTER — Ambulatory Visit: Payer: BC Managed Care – PPO | Admitting: Oncology

## 2019-10-09 ENCOUNTER — Other Ambulatory Visit: Payer: BC Managed Care – PPO

## 2019-10-10 ENCOUNTER — Other Ambulatory Visit: Payer: Self-pay | Admitting: Neurology

## 2019-10-10 DIAGNOSIS — M5417 Radiculopathy, lumbosacral region: Secondary | ICD-10-CM

## 2019-10-24 ENCOUNTER — Other Ambulatory Visit: Payer: Self-pay

## 2019-10-24 ENCOUNTER — Ambulatory Visit
Admission: RE | Admit: 2019-10-24 | Discharge: 2019-10-24 | Disposition: A | Discharge status: Home / Self Care | Payer: BC Managed Care – PPO | Source: Ambulatory Visit | Attending: Neurology | Admitting: Neurology

## 2019-10-24 DIAGNOSIS — M5417 Radiculopathy, lumbosacral region: Secondary | ICD-10-CM | POA: Insufficient documentation

## 2019-10-24 LAB — POCT I-STAT CREATININE: Creatinine, Ser: 0.7 mg/dL (ref 0.44–1.00)

## 2019-10-24 MED ORDER — GADOBUTROL 1 MMOL/ML IV SOLN
7.0000 mL | Freq: Once | INTRAVENOUS | Status: AC | PRN
  Administered 2019-10-24: 18:00:00 7 mL via INTRAVENOUS

## 2019-10-31 ENCOUNTER — Other Ambulatory Visit: Payer: Self-pay

## 2019-10-31 ENCOUNTER — Inpatient Hospital Stay: Payer: BC Managed Care – PPO | Attending: Oncology

## 2019-10-31 DIAGNOSIS — Z86718 Personal history of other venous thrombosis and embolism: Secondary | ICD-10-CM | POA: Insufficient documentation

## 2019-10-31 DIAGNOSIS — I10 Essential (primary) hypertension: Secondary | ICD-10-CM | POA: Insufficient documentation

## 2019-10-31 DIAGNOSIS — D649 Anemia, unspecified: Secondary | ICD-10-CM | POA: Diagnosis not present

## 2019-10-31 DIAGNOSIS — Z79899 Other long term (current) drug therapy: Secondary | ICD-10-CM | POA: Diagnosis not present

## 2019-10-31 DIAGNOSIS — I82412 Acute embolism and thrombosis of left femoral vein: Secondary | ICD-10-CM

## 2019-10-31 DIAGNOSIS — M79605 Pain in left leg: Secondary | ICD-10-CM | POA: Insufficient documentation

## 2019-10-31 DIAGNOSIS — Z7901 Long term (current) use of anticoagulants: Secondary | ICD-10-CM | POA: Diagnosis not present

## 2019-10-31 LAB — CBC WITH DIFFERENTIAL/PLATELET
Abs Immature Granulocytes: 0.01 10*3/uL (ref 0.00–0.07)
Basophils Absolute: 0 10*3/uL (ref 0.0–0.1)
Basophils Relative: 1 %
Eosinophils Absolute: 0.1 10*3/uL (ref 0.0–0.5)
Eosinophils Relative: 2 %
HCT: 34.6 % — ABNORMAL LOW (ref 36.0–46.0)
Hemoglobin: 10.6 g/dL — ABNORMAL LOW (ref 12.0–15.0)
Immature Granulocytes: 0 %
Lymphocytes Relative: 42 %
Lymphs Abs: 2.3 10*3/uL (ref 0.7–4.0)
MCH: 28.3 pg (ref 26.0–34.0)
MCHC: 30.6 g/dL (ref 30.0–36.0)
MCV: 92.3 fL (ref 80.0–100.0)
Monocytes Absolute: 0.4 10*3/uL (ref 0.1–1.0)
Monocytes Relative: 8 %
Neutro Abs: 2.5 10*3/uL (ref 1.7–7.7)
Neutrophils Relative %: 47 %
Platelets: 208 10*3/uL (ref 150–400)
RBC: 3.75 MIL/uL — ABNORMAL LOW (ref 3.87–5.11)
RDW: 12.5 % (ref 11.5–15.5)
WBC: 5.4 10*3/uL (ref 4.0–10.5)
nRBC: 0 % (ref 0.0–0.2)

## 2019-10-31 LAB — COMPREHENSIVE METABOLIC PANEL
ALT: 12 U/L (ref 0–44)
AST: 14 U/L — ABNORMAL LOW (ref 15–41)
Albumin: 3.7 g/dL (ref 3.5–5.0)
Alkaline Phosphatase: 50 U/L (ref 38–126)
Anion gap: 6 (ref 5–15)
BUN: 24 mg/dL — ABNORMAL HIGH (ref 6–20)
CO2: 29 mmol/L (ref 22–32)
Calcium: 8.8 mg/dL — ABNORMAL LOW (ref 8.9–10.3)
Chloride: 103 mmol/L (ref 98–111)
Creatinine, Ser: 0.56 mg/dL (ref 0.44–1.00)
GFR calc Af Amer: >60 mL/min (ref >60)
GFR calc non Af Amer: >60 mL/min (ref >60)
Glucose, Bld: 99 mg/dL (ref 70–99)
Potassium: 3.6 mmol/L (ref 3.5–5.1)
Sodium: 138 mmol/L (ref 135–145)
Total Bilirubin: 0.4 mg/dL (ref 0.3–1.2)
Total Protein: 7.5 g/dL (ref 6.5–8.1)

## 2019-11-01 ENCOUNTER — Other Ambulatory Visit: Payer: Self-pay

## 2019-11-01 ENCOUNTER — Encounter: Payer: Self-pay | Admitting: Oncology

## 2019-11-01 ENCOUNTER — Other Ambulatory Visit: Payer: BC Managed Care – PPO

## 2019-11-01 ENCOUNTER — Inpatient Hospital Stay (HOSPITAL_BASED_OUTPATIENT_CLINIC_OR_DEPARTMENT_OTHER): Payer: BC Managed Care – PPO | Admitting: Oncology

## 2019-11-01 DIAGNOSIS — E538 Deficiency of other specified B group vitamins: Secondary | ICD-10-CM | POA: Insufficient documentation

## 2019-11-01 DIAGNOSIS — D649 Anemia, unspecified: Secondary | ICD-10-CM

## 2019-11-01 DIAGNOSIS — Z86718 Personal history of other venous thrombosis and embolism: Secondary | ICD-10-CM

## 2019-11-01 DIAGNOSIS — I82412 Acute embolism and thrombosis of left femoral vein: Secondary | ICD-10-CM

## 2019-11-01 LAB — RETIC PANEL
Immature Retic Fract: 12.7 % (ref 2.3–15.9)
RBC.: 3.79 MIL/uL — ABNORMAL LOW (ref 3.87–5.11)
Retic Count, Absolute: 90.2 10*3/uL (ref 19.0–186.0)
Retic Ct Pct: 2.4 % (ref 0.4–3.1)
Reticulocyte Hemoglobin: 30.9 pg (ref >27.9)

## 2019-11-01 LAB — FERRITIN: Ferritin: 62 ng/mL (ref 11–307)

## 2019-11-01 LAB — IRON AND TIBC
Iron: 43 ug/dL (ref 28–170)
Saturation Ratios: 14 % (ref 10.4–31.8)
TIBC: 306 ug/dL (ref 250–450)
UIBC: 263 ug/dL

## 2019-11-01 MED ORDER — FERROUS SULFATE 325 (65 FE) MG PO TBEC: 325.0000 mg | DELAYED_RELEASE_TABLET | Freq: Three times a day (TID) | ORAL | 0 refills | Status: DC

## 2019-11-01 MED ORDER — DOCUSATE SODIUM 100 MG PO CAPS: 100.0000 mg | ORAL_CAPSULE | Freq: Every day | ORAL | 0 refills | Status: DC | PRN

## 2019-11-01 NOTE — Progress Notes (Signed)
Patient verified using two identifiers for virtual visit via telephone today.  Patient reports having left leg pain 10/10 and upper back pain 10/10.  She recently had MRI ordered by neurologist for the back pain.

## 2019-11-02 NOTE — Progress Notes (Signed)
HEMATOLOGY-ONCOLOGY TeleHEALTH VISIT PROGRESS NOTE  I connected with Alexandra Montes on 11/02/19 at 10:30 AM EST by video enabled telemedicine visit and verified that I am speaking with the correct person using two identifiers. I discussed the limitations, risks, security and privacy concerns of performing an evaluation and management service by telemedicine and the availability of in-person appointments. I also discussed with the patient that there may be a patient responsible charge related to this service. The patient expressed understanding and agreed to proceed.   Other persons participating in the visit and their role in the encounter:  None  Patient's location: Home  Provider's location: office Chief Complaint: Chronic anticoagulation, history of DVT   INTERVAL HISTORY Alexandra Montes is a 43 y.o. female who has above history reviewed by me today presents for follow up visit for management of history of DVT Problems and complaints are listed below:  Patient reports feeling well. Lower extremity swelling has improved.  Patient is on Xarelto 20 mg daily.  Tolerating well.  Denies any acute bleeding events.    She continues to have left lower extremity pain.  Patient also complained upper back pain 10 out of 10 she has a pending MRI to be done, obtained by urologist. Review of Systems  Constitutional: Positive for fatigue. Negative for appetite change, chills and fever.  HENT:   Negative for hearing loss and voice change.   Eyes: Negative for eye problems.  Respiratory: Negative for chest tightness and cough.   Cardiovascular: Negative for chest pain.  Gastrointestinal: Negative for abdominal distention, abdominal pain and blood in stool.  Endocrine: Negative for hot flashes.  Genitourinary: Negative for difficulty urinating and frequency.   Musculoskeletal: Negative for arthralgias.       Chronic lower extremity pain.  Upper back pain  Skin: Negative for itching and rash.   Neurological: Negative for extremity weakness.  Hematological: Negative for adenopathy.  Psychiatric/Behavioral: Negative for confusion.    Past Medical History:  Diagnosis Date  . Anxiety   . Breast mass   . Carpal tunnel syndrome   . Cauliflower ear, right   . DVT of deep femoral vein, left ([place])   . GERD (gastroesophageal reflux disease)   . Headache   . Hypertension   . Ovarian cyst    Right   Past Surgical History:  Procedure Laterality Date  . AMPUTATION FINGER    . AMPUTATION TOE    . NO PAST SURGERIES      Family History  Problem Relation Age of Onset  . Heart disease Mother   . Bipolar disorder Mother   . Drug abuse Mother   . Alcohol abuse Mother   . Breast cancer Mother   . Schizophrenia Father   . Lung cancer Maternal Grandfather     Social History   Socioeconomic History  . Marital status: Divorced    Spouse name: Not on file  . Number of children: 0  . Years of education: Not on file  . Highest education level: GED or equivalent  Occupational History  . Not on file  Tobacco Use  . Smoking status: Never Smoker  . Smokeless tobacco: Never Used  Substance and Sexual Activity  . Alcohol use: No  . Drug use: No  . Sexual activity: Yes    Birth control/protection: None  Other Topics Concern  . Not on file  Social History Narrative  . Not on file   Social Determinants of Health   Financial Resource Strain:   .  Difficulty of Paying Living Expenses: Not on file  Food Insecurity:   . Worried About Charity fundraiser in the Last Year: Not on file  . Ran Out of Food in the Last Year: Not on file  Transportation Needs:   . Lack of Transportation (Medical): Not on file  . Lack of Transportation (Non-Medical): Not on file  Physical Activity:   . Days of Exercise per Week: Not on file  . Minutes of Exercise per Session: Not on file  Stress:   . Feeling of Stress : Not on file  Social Connections:   . Frequency of Communication with Friends and  Family: Not on file  . Frequency of Social Gatherings with Friends and Family: Not on file  . Attends Religious Services: Not on file  . Active Member of Clubs or Organizations: Not on file  . Attends Archivist Meetings: Not on file  . Marital Status: Not on file  Intimate Partner Violence:   . Fear of Current or Ex-Partner: Not on file  . Emotionally Abused: Not on file  . Physically Abused: Not on file  . Sexually Abused: Not on file    Current Outpatient Medications on File Prior to Visit  Medication Sig Dispense Refill  . acetaminophen (TYLENOL) 325 MG tablet Take 650 mg by mouth 4 (four) times daily.    Marland Kitchen albuterol (PROAIR HFA) 108 (90 Base) MCG/ACT inhaler ProAir HFA 108 (90 Base) MCG/ACT Inhalation Aerosol Solution QTY: 1 inhaler Days: 30 Refills: 1  Written: 02/27/18 Patient Instructions: Inhale 1 puff orally every 6 hours as needed for shortness of breath    . citalopram (CELEXA) 10 MG tablet Take 1 tablet (10 mg total) by mouth daily. 30 tablet 1  . ergocalciferol (VITAMIN D2) 1.25 MG (50000 UT) capsule Ergocalciferol 1.25 MG (50000 UT) Oral Capsule QTY: 12 capsule Days: 84 Refills: 3  Written: 03/14/19 Patient Instructions: Take 1 capsule by mouth WEEKLY    . fluticasone (FLONASE) 50 MCG/ACT nasal spray Fluticasone Propionate 50 MCG/ACT Nasal Suspension QTY: 1 bottle Days: 30 Refills: 4  Written: 02/19/18 Patient Instructions: Lean head forward, instill 1 spray in each nostril daily with a gentle sniff    . gabapentin (NEURONTIN) 100 MG capsule 300 mg 3 (three) times daily.     . hydrochlorothiazide (HYDRODIURIL) 25 MG tablet Take 25 mg by mouth daily.  0  . loratadine (CLARITIN) 10 MG tablet TAKE 1 TABLET BY MOUTH ONCE DAILY AS NEEDED FOR SEASONAL ALLERGY SYMPTOMS.    Marland Kitchen losartan (COZAAR) 50 MG tablet Take 50 mg by mouth daily.  1  . mupirocin ointment (BACTROBAN) 2 % Apply 1 application topically 3 (three) times daily. 22 g 0  . omeprazole (PRILOSEC) 40 MG capsule  Omeprazole 40 MG Oral Capsule Delayed Release QTY: 30 capsule Days: 30 Refills: 0  Written: 02/26/19 Patient Instructions: Take 1 capsule by mouth daily in AM 30 min prior to breakfast x 1 month    . rivaroxaban (XARELTO) 20 MG TABS tablet Xarelto 20 MG Oral Tablet QTY: 30 tablet Days: 30 Refills: 3  Written: 05/05/19 Patient Instructions: TAKE 1 TABLET BY MOUTH ONCE DAILY AT SAME TIME EACH DAY    . sucralfate (CARAFATE) 1 g tablet Carafate 1 GM Oral Tablet QTY: 120 tablet Days: 30 Refills: 0  Written: 02/26/19 Patient Instructions: Take 1 tablet by mouth four times daily after meals (dissolve in 3 tablespoons of water)    . traMADol (ULTRAM) 50 MG tablet traMADol HCl 50  MG Oral Tablet QTY: 20 tablet Days: 5 Refills: 0  Written: 06/14/19 Patient Instructions: Take one tablet by mouth every 6 hours as needed     No current facility-administered medications on file prior to visit.    No Known Allergies     Observations/Objective: Today's Vitals   11/01/19 1019  PainSc: 10-Worst pain ever   There is no height or weight on file to calculate BMI.  Physical Exam  CBC    Component Value Date/Time   WBC 5.4 10/31/2019 1532   RBC 3.75 (L) 10/31/2019 1532   RBC 3.79 (L) 10/31/2019 1532   HGB 10.6 (L) 10/31/2019 1532   HGB 12.8 10/25/2012 1330   HCT 34.6 (L) 10/31/2019 1532   HCT 39.3 10/25/2012 1330   PLT 208 10/31/2019 1532   PLT 175 10/25/2012 1330   MCV 92.3 10/31/2019 1532   MCV 90 10/25/2012 1330   MCH 28.3 10/31/2019 1532   MCHC 30.6 10/31/2019 1532   RDW 12.5 10/31/2019 1532   RDW 12.9 10/25/2012 1330   LYMPHSABS 2.3 10/31/2019 1532   MONOABS 0.4 10/31/2019 1532   EOSABS 0.1 10/31/2019 1532   BASOSABS 0.0 10/31/2019 1532    CMP     Component Value Date/Time   NA 138 10/31/2019 1532   NA 138 10/25/2012 1330   K 3.6 10/31/2019 1532   K 3.3 (L) 10/25/2012 1330   CL 103 10/31/2019 1532   CL 105 10/25/2012 1330   CO2 29 10/31/2019 1532   CO2 26 10/25/2012 1330   GLUCOSE 99  10/31/2019 1532   GLUCOSE 100 (H) 10/25/2012 1330   BUN 24 (H) 10/31/2019 1532   BUN 13 10/25/2012 1330   CREATININE 0.56 10/31/2019 1532   CREATININE 0.62 10/25/2012 1330   CALCIUM 8.8 (L) 10/31/2019 1532   CALCIUM 8.8 10/25/2012 1330   PROT 7.5 10/31/2019 1532   PROT 7.9 10/25/2012 1330   ALBUMIN 3.7 10/31/2019 1532   ALBUMIN 3.7 10/25/2012 1330   AST 14 (L) 10/31/2019 1532   AST 21 10/25/2012 1330   ALT 12 10/31/2019 1532   ALT 29 10/25/2012 1330   ALKPHOS 50 10/31/2019 1532   ALKPHOS 87 10/25/2012 1330   BILITOT 0.4 10/31/2019 1532   BILITOT 0.8 10/25/2012 1330   GFRNONAA >60 10/31/2019 1532   GFRNONAA >60 10/25/2012 1330   GFRAA >60 10/31/2019 1532   GFRAA >60 10/25/2012 1330     Assessment and Plan: 1. History of deep vein thrombosis (DVT) of lower extremity   2. Normocytic anemia     Labs are reviewed and discussed with patient.    Hemoglobin 10.6.  Iron level showed iron saturation 14.  Ferritin 62.  Patient has been on Xarelto 20 mg daily, tolerating well.  Continue another 3 months of anticoagulation.  Obtain left lower extremity venous duplex in 3 months.  Patient will follow up at that time to discuss maintenance anticoagulation. Chronic leg pain and upper back pain, recommend patient continue follow-up with primary care provider  and neurologist.   Anemia, hemoglobin 10.6, iron showed borderline iron saturation.  Recommend patient to take oral iron supplementation.  Repeat blood work in 3 months. Follow Up Instructions: 3 months   I discussed the assessment and treatment plan with the patient. The patient was provided an opportunity to ask questions and all were answered. The patient agreed with the plan and demonstrated an understanding of the instructions.  The patient was advised to call back or seek an in-person evaluation if the  symptoms worsen or if the condition fails to improve as anticipated.  Earlie Server, MD 11/02/2019 10:30 PM

## 2020-01-09 ENCOUNTER — Telehealth: Payer: Self-pay

## 2020-01-09 NOTE — Telephone Encounter (Signed)
Done..  Appts has been r/s per pt request..

## 2020-01-09 NOTE — Telephone Encounter (Signed)
patient left message stating that she will not be able to go to [place]  Scheduled on 3/9 and requests rescheduling and a call back. Appts on 3/11 will also need to be rescheduled to after [place].

## 2020-01-28 ENCOUNTER — Ambulatory Visit: Payer: BC Managed Care – PPO

## 2020-01-28 ENCOUNTER — Other Ambulatory Visit: Payer: Self-pay

## 2020-01-28 ENCOUNTER — Telehealth: Payer: Self-pay | Admitting: *Deleted

## 2020-01-28 DIAGNOSIS — Z86718 Personal history of other venous thrombosis and embolism: Secondary | ICD-10-CM

## 2020-01-28 NOTE — Telephone Encounter (Signed)
New order entered and e-signed by Dr. Tasia Catchings.

## 2020-01-28 NOTE — Telephone Encounter (Signed)
[place] department called requesting that the Doppler order for this patient be signed. Her appointment is Thursday. The order was entered in Dec by Benjamine Mola but not signed by Dr Tasia Catchings.

## 2020-01-30 ENCOUNTER — Ambulatory Visit
Admission: RE | Admit: 2020-01-30 | Discharge: 2020-01-30 | Disposition: A | Discharge status: Home / Self Care | Payer: 59 | Source: Ambulatory Visit | Attending: Oncology | Admitting: Oncology

## 2020-01-30 ENCOUNTER — Other Ambulatory Visit: Payer: BC Managed Care – PPO

## 2020-01-30 ENCOUNTER — Other Ambulatory Visit: Payer: Self-pay

## 2020-01-30 ENCOUNTER — Ambulatory Visit: Payer: BC Managed Care – PPO | Admitting: Oncology

## 2020-01-30 DIAGNOSIS — Z86718 Personal history of other venous thrombosis and embolism: Secondary | ICD-10-CM | POA: Diagnosis present

## 2020-02-07 ENCOUNTER — Inpatient Hospital Stay: Payer: 59 | Admitting: Oncology

## 2020-02-07 ENCOUNTER — Other Ambulatory Visit: Payer: Self-pay

## 2020-02-07 ENCOUNTER — Encounter: Payer: Self-pay | Admitting: Oncology

## 2020-02-07 ENCOUNTER — Inpatient Hospital Stay: Payer: 59 | Attending: Oncology

## 2020-02-07 VITALS — BP 110/76 | HR 60 | Temp 97.1°F | Resp 18 | Wt 171.8 lb

## 2020-02-07 DIAGNOSIS — Z79899 Other long term (current) drug therapy: Secondary | ICD-10-CM | POA: Diagnosis not present

## 2020-02-07 DIAGNOSIS — Z86718 Personal history of other venous thrombosis and embolism: Secondary | ICD-10-CM | POA: Diagnosis not present

## 2020-02-07 DIAGNOSIS — D649 Anemia, unspecified: Secondary | ICD-10-CM

## 2020-02-07 DIAGNOSIS — Z7901 Long term (current) use of anticoagulants: Secondary | ICD-10-CM | POA: Insufficient documentation

## 2020-02-07 DIAGNOSIS — Z803 Family history of malignant neoplasm of breast: Secondary | ICD-10-CM | POA: Insufficient documentation

## 2020-02-07 DIAGNOSIS — M79605 Pain in left leg: Secondary | ICD-10-CM

## 2020-02-07 DIAGNOSIS — E876 Hypokalemia: Secondary | ICD-10-CM | POA: Diagnosis not present

## 2020-02-07 DIAGNOSIS — I10 Essential (primary) hypertension: Secondary | ICD-10-CM | POA: Diagnosis not present

## 2020-02-07 LAB — CBC WITH DIFFERENTIAL/PLATELET
Abs Immature Granulocytes: 0 10*3/uL (ref 0.00–0.07)
Basophils Absolute: 0 10*3/uL (ref 0.0–0.1)
Basophils Relative: 1 %
Eosinophils Absolute: 0.1 10*3/uL (ref 0.0–0.5)
Eosinophils Relative: 2 %
HCT: 37.5 % (ref 36.0–46.0)
Hemoglobin: 12.2 g/dL (ref 12.0–15.0)
Immature Granulocytes: 0 %
Lymphocytes Relative: 33 %
Lymphs Abs: 1.6 10*3/uL (ref 0.7–4.0)
MCH: 29.1 pg (ref 26.0–34.0)
MCHC: 32.5 g/dL (ref 30.0–36.0)
MCV: 89.5 fL (ref 80.0–100.0)
Monocytes Absolute: 0.4 10*3/uL (ref 0.1–1.0)
Monocytes Relative: 8 %
Neutro Abs: 2.8 10*3/uL (ref 1.7–7.7)
Neutrophils Relative %: 56 %
Platelets: 198 10*3/uL (ref 150–400)
RBC: 4.19 MIL/uL (ref 3.87–5.11)
RDW: 12.5 % (ref 11.5–15.5)
WBC: 4.9 10*3/uL (ref 4.0–10.5)
nRBC: 0 % (ref 0.0–0.2)

## 2020-02-07 LAB — COMPREHENSIVE METABOLIC PANEL
ALT: 14 U/L (ref 0–44)
AST: 14 U/L — ABNORMAL LOW (ref 15–41)
Albumin: 3.9 g/dL (ref 3.5–5.0)
Alkaline Phosphatase: 63 U/L (ref 38–126)
Anion gap: 9 (ref 5–15)
BUN: 13 mg/dL (ref 6–20)
CO2: 28 mmol/L (ref 22–32)
Calcium: 8.9 mg/dL (ref 8.9–10.3)
Chloride: 102 mmol/L (ref 98–111)
Creatinine, Ser: 0.57 mg/dL (ref 0.44–1.00)
GFR calc Af Amer: >60 mL/min (ref >60)
GFR calc non Af Amer: >60 mL/min (ref >60)
Glucose, Bld: 88 mg/dL (ref 70–99)
Potassium: 3.4 mmol/L — ABNORMAL LOW (ref 3.5–5.1)
Sodium: 139 mmol/L (ref 135–145)
Total Bilirubin: 0.9 mg/dL (ref 0.3–1.2)
Total Protein: 7.6 g/dL (ref 6.5–8.1)

## 2020-02-07 LAB — FERRITIN: Ferritin: 78 ng/mL (ref 11–307)

## 2020-02-07 LAB — IRON AND TIBC
Iron: 68 ug/dL (ref 28–170)
Saturation Ratios: 22 % (ref 10.4–31.8)
TIBC: 311 ug/dL (ref 250–450)
UIBC: 243 ug/dL

## 2020-02-07 MED ORDER — RIVAROXABAN 10 MG PO TABS: 10.0000 mg | ORAL_TABLET | Freq: Every day | ORAL | 1 refills | Status: DC

## 2020-02-07 NOTE — Progress Notes (Signed)
Patient here for follow up. Reports having pain 7/10 to left leg.

## 2020-02-08 NOTE — Progress Notes (Signed)
Hematology/Oncology Consult note [facility] [phone] [phone]   Patient Care Team: Danelle Berry, NP as PCP - General (Nurse Practitioner)  REFERRING PROVIDER: Danelle Berry, NP  CHIEF COMPLAINTS/REASON FOR VISIT:  Evaluation of DVT, anticoagulation disorder  HISTORY OF PRESENTING ILLNESS:   Alexandra Montes is a  44 y.o.  female with PMH listed below was seen in consultation at the request of  Danelle Berry, NP  for evaluation of DVT, anticoagulation disorder Extensive medical record review was performed. In April 2020 patient presented to primary care provider with complaints of left lower extremity pain. 03/08/2019 left lower extremity duplex venous showed thrombus in the superficial femoral vein distal segment. Patient was started on anticoagulation with Xarelto starter pack. Reports tolerating anticoagulation.  Currently on Xarelto 20 mg daily. Denies hematochezia, hematuria, hematemesis, epistaxis, black tarry stool or easy bruising.  She denies any previous history of DVT, reports positive family history of blood clots Denies any immobilization factors prior to the diagnosis of left lower extremity DVT.  05/03/2019 right lower extremity duplex venous Showed no evidence of DVT.  Patient continues to have intermittent burning sensation of the left lower extremity, exacerbated with movement.  Lab work-up done at [facility] medical 02/22/2019 creatinine 0.7, bilirubin 0.6, normal AST ALT CBC showed WBC 5, hemoglobin 11.7, hematocrit 34.7, MCV 87, platelet 202,000 TSH 1.67   INTERVAL HISTORY Alexandra Montes is a 44 y.o. female who has above history reviewed by me today presents for follow up visit for management of DVT Problems and complaints are listed below: Patient has finished 6 months of anticoagulation.  She tolerates well.  No bleeding events. She continues to have chronic lower extremity pain.  Review of Systems    Constitutional: Negative for appetite change, chills, fatigue and fever.  HENT:   Negative for hearing loss and voice change.   Eyes: Negative for eye problems.  Respiratory: Negative for chest tightness and cough.   Cardiovascular: Negative for chest pain.  Gastrointestinal: Negative for abdominal distention, abdominal pain and blood in stool.  Endocrine: Negative for hot flashes.  Genitourinary: Negative for difficulty urinating and frequency.   Musculoskeletal: Negative for arthralgias.       Left lower extremity intermittent burning sensation and pain.  Skin: Negative for itching and rash.  Neurological: Negative for extremity weakness.  Hematological: Negative for adenopathy.  Psychiatric/Behavioral: Negative for confusion.    MEDICAL HISTORY:  Past Medical History:  Diagnosis Date  . Anxiety   . Breast mass   . Carpal tunnel syndrome   . Cauliflower ear, right   . DVT of deep femoral vein, left ([place])   . GERD (gastroesophageal reflux disease)   . Headache   . Hypertension   . Ovarian cyst    Right    SURGICAL HISTORY: Past Surgical History:  Procedure Laterality Date  . AMPUTATION FINGER    . AMPUTATION TOE    . NO PAST SURGERIES      SOCIAL HISTORY: Social History   Socioeconomic History  . Marital status: Divorced    Spouse name: Not on file  . Number of children: 0  . Years of education: Not on file  . Highest education level: GED or equivalent  Occupational History  . Not on file  Tobacco Use  . Smoking status: Never Smoker  . Smokeless tobacco: Never Used  Substance and Sexual Activity  . Alcohol use: No  . Drug use: No  . Sexual activity: Yes  Birth control/protection: None  Other Topics Concern  . Not on file  Social History Narrative  . Not on file   Social Determinants of Health   Financial Resource Strain:   . Difficulty of Paying Living Expenses:   Food Insecurity:   . Worried About Charity fundraiser in the Last Year:   . Arts development officer in the Last Year:   Transportation Needs:   . Film/video editor (Medical):   Marland Kitchen Lack of Transportation (Non-Medical):   Physical Activity:   . Days of Exercise per Week:   . Minutes of Exercise per Session:   Stress:   . Feeling of Stress :   Social Connections:   . Frequency of Communication with Friends and Family:   . Frequency of Social Gatherings with Friends and Family:   . Attends Religious Services:   . Active Member of Clubs or Organizations:   . Attends Archivist Meetings:   Marland Kitchen Marital Status:   Intimate Partner Violence:   . Fear of Current or Ex-Partner:   . Emotionally Abused:   Marland Kitchen Physically Abused:   . Sexually Abused:     FAMILY HISTORY: Family History  Problem Relation Age of Onset  . Heart disease Mother   . Bipolar disorder Mother   . Drug abuse Mother   . Alcohol abuse Mother   . Breast cancer Mother   . Schizophrenia Father   . Lung cancer Maternal Grandfather     ALLERGIES:  has No Known Allergies.  MEDICATIONS:  Current Outpatient Medications  Medication Sig Dispense Refill  . acetaminophen (TYLENOL) 325 MG tablet Take 650 mg by mouth 4 (four) times daily.    Marland Kitchen albuterol (PROAIR HFA) 108 (90 Base) MCG/ACT inhaler ProAir HFA 108 (90 Base) MCG/ACT Inhalation Aerosol Solution QTY: 1 inhaler Days: 30 Refills: 1  Written: 02/27/18 Patient Instructions: Inhale 1 puff orally every 6 hours as needed for shortness of breath    . citalopram (CELEXA) 10 MG tablet Take 1 tablet (10 mg total) by mouth daily. 30 tablet 1  . ergocalciferol (VITAMIN D2) 1.25 MG (50000 UT) capsule Ergocalciferol 1.25 MG (50000 UT) Oral Capsule QTY: 12 capsule Days: 84 Refills: 3  Written: 03/14/19 Patient Instructions: Take 1 capsule by mouth WEEKLY    . ferrous sulfate 325 (65 FE) MG EC tablet Take 1 tablet (325 mg total) by mouth 3 (three) times daily with meals. (Patient taking differently: Take 325 mg by mouth daily with breakfast. ) 90 tablet 0  .  fluticasone (FLONASE) 50 MCG/ACT nasal spray Fluticasone Propionate 50 MCG/ACT Nasal Suspension QTY: 1 bottle Days: 30 Refills: 4  Written: 02/19/18 Patient Instructions: Lean head forward, instill 1 spray in each nostril daily with a gentle sniff    . gabapentin (NEURONTIN) 100 MG capsule 300 mg 3 (three) times daily.     . hydrochlorothiazide (HYDRODIURIL) 25 MG tablet Take 25 mg by mouth daily.  0  . loratadine (CLARITIN) 10 MG tablet TAKE 1 TABLET BY MOUTH ONCE DAILY AS NEEDED FOR SEASONAL ALLERGY SYMPTOMS.    Marland Kitchen losartan (COZAAR) 50 MG tablet Take 50 mg by mouth daily.  1  . methocarbamol (ROBAXIN) 500 MG tablet Take 250-500 mg by mouth at bedtime as needed.    . mupirocin ointment (BACTROBAN) 2 % Apply 1 application topically 3 (three) times daily. 22 g 0  . omeprazole (PRILOSEC) 40 MG capsule Omeprazole 40 MG Oral Capsule Delayed Release QTY: 30 capsule Days: 30 Refills:  0  Written: 02/26/19 Patient Instructions: Take 1 capsule by mouth daily in AM 30 min prior to breakfast x 1 month    . sucralfate (CARAFATE) 1 g tablet Carafate 1 GM Oral Tablet QTY: 120 tablet Days: 30 Refills: 0  Written: 02/26/19 Patient Instructions: Take 1 tablet by mouth four times daily after meals (dissolve in 3 tablespoons of water)    . traMADol (ULTRAM) 50 MG tablet traMADol HCl 50 MG Oral Tablet QTY: 20 tablet Days: 5 Refills: 0  Written: 06/14/19 Patient Instructions: Take one tablet by mouth every 6 hours as needed    . docusate sodium (COLACE) 100 MG capsule Take 1 capsule (100 mg total) by mouth daily as needed for mild constipation or moderate constipation. Do not take if you have loose stools (Patient not taking: Reported on 02/07/2020) 90 capsule 0  . rivaroxaban (XARELTO) 10 MG TABS tablet Take 1 tablet (10 mg total) by mouth daily. 90 tablet 1   No current facility-administered medications for this visit.     PHYSICAL EXAMINATION: ECOG PERFORMANCE STATUS: 1 - Symptomatic but completely ambulatory Vitals:    02/07/20 1331  BP: 110/76  Pulse: 60  Resp: 18  Temp: (!) 97.1 F (36.2 C)   Filed Weights   02/07/20 1331  Weight: 171 lb 12.8 oz (77.9 kg)    Physical Exam Constitutional:      General: She is not in acute distress. HENT:     Head: Normocephalic and atraumatic.  Eyes:     General: No scleral icterus.    Pupils: Pupils are equal, round, and reactive to light.  Cardiovascular:     Rate and Rhythm: Normal rate and regular rhythm.     Heart sounds: Normal heart sounds.  Pulmonary:     Effort: Pulmonary effort is normal. No respiratory distress.     Breath sounds: No wheezing.  Abdominal:     General: Bowel sounds are normal. There is no distension.     Palpations: Abdomen is soft. There is no mass.     Tenderness: There is no abdominal tenderness.  Musculoskeletal:        General: No deformity. Normal range of motion.     Cervical back: Normal range of motion and neck supple.  Skin:    General: Skin is warm and dry.     Findings: No erythema or rash.  Neurological:     Mental Status: She is alert and oriented to person, place, and time. Mental status is at baseline.     Cranial Nerves: No cranial nerve deficit.     Coordination: Coordination normal.  Psychiatric:        Mood and Affect: Mood normal.     Comments: Anxious      LABORATORY DATA:  I have reviewed the data as listed Lab Results  Component Value Date   WBC 4.9 02/07/2020   HGB 12.2 02/07/2020   HCT 37.5 02/07/2020   MCV 89.5 02/07/2020   PLT 198 02/07/2020   Recent Labs    06/28/19 1501 06/28/19 1501 10/24/19 1755 10/31/19 1532 02/07/20 1301  NA 141  --   --  138 139  K 3.4*  --   --  3.6 3.4*  CL 102  --   --  103 102  CO2 31  --   --  29 28  GLUCOSE 99  --   --  99 88  BUN 14  --   --  24* 13  CREATININE 0.77   < >  0.70 0.56 0.57  CALCIUM 8.9  --   --  8.8* 8.9  GFRNONAA >60  --   --  >60 >60  GFRAA >60  --   --  >60 >60  PROT 7.8  --   --  7.5 7.6  ALBUMIN 3.9  --   --  3.7 3.9   AST 13*  --   --  14* 14*  ALT 13  --   --  12 14  ALKPHOS 52  --   --  50 63  BILITOT 0.8  --   --  0.4 0.9   < > = values in this interval not displayed.   Iron/TIBC/Ferritin/ %Sat    Component Value Date/Time   IRON 68 02/07/2020 1301   TIBC 311 02/07/2020 1301   FERRITIN 78 02/07/2020 1301   IRONPCTSAT 22 02/07/2020 1301      RADIOGRAPHIC STUDIES: I have personally reviewed the radiological images as listed and agreed with the findings in the report.  US Venous Img Lower Unilateral Left  Result Date: 01/30/2020 CLINICAL DATA:  Left lower extremity pain and edema for the past 8 months. History of previous DVT, currently on anticoagulation. Evaluate for acute or chronic DVT. EXAM: LEFT LOWER EXTREMITY VENOUS DOPPLER ULTRASOUND TECHNIQUE: Gray-scale sonography with graded compression, as well as color Doppler and duplex ultrasound were performed to evaluate the lower extremity deep venous systems from the level of the common femoral vein and including the common femoral, femoral, profunda femoral, popliteal and calf veins including the posterior tibial, peroneal and gastrocnemius veins when visible. The superficial great saphenous vein was also interrogated. Spectral Doppler was utilized to evaluate flow at rest and with distal augmentation maneuvers in the common femoral, femoral and popliteal veins. COMPARISON:  None. FINDINGS: Contralateral Common Femoral Vein: Respiratory phasicity is normal and symmetric with the symptomatic side. No evidence of thrombus. Normal compressibility. Common Femoral Vein: No evidence of thrombus. Normal compressibility, respiratory phasicity and response to augmentation. Saphenofemoral Junction: No evidence of thrombus. Normal compressibility and flow on color Doppler imaging. Profunda Femoral Vein: No evidence of thrombus. Normal compressibility and flow on color Doppler imaging. Femoral Vein: No evidence of thrombus. Normal compressibility, respiratory  phasicity and response to augmentation. Popliteal Vein: No evidence of thrombus. Normal compressibility, respiratory phasicity and response to augmentation. Calf Veins: No evidence of thrombus. Normal compressibility and flow on color Doppler imaging. Superficial Great Saphenous Vein: No evidence of thrombus. Normal compressibility. Venous Reflux:  None. Other Findings:  None. IMPRESSION: No evidence of acute or chronic DVT within the left lower extremity. Electronically Signed   By: Sandi Mariscal M.D.   On: 01/30/2020 12:16      ASSESSMENT & PLAN:  1. History of deep vein thrombosis (DVT) of lower extremity   2. Normocytic anemia   3. Pain of left lower extremity    #01/30/2020 ultrasound left nodularity venous showed no evidence of acute or chronic DVT within the left lower extremity. She has finished 6 months of anticoagulation. I recommend patient to decrease Xarelto for 10 mg daily as anticoagulation maintenance. Recommend kidney function being monitored every 6 months.  #Chronic lower extremity pain and edema. 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  Recommend compression stocking patient is not interested.  She has tried in the past with no help. Lower extremity pain/left thigh pain more consistent with radiculopathy/sciatic pain. Encourage patient to further discuss with primary care provider/orthopedic surgeon  Continue chronic anticoagulation maintenance.  Rationale and risk of anticoagulation maintenance were discussed with patient.  Patient opted to be discharged for now and follow up with primary care provider.  I sent her a Rx of Xarelto 10mg  3 months supply with 1 refill. She will follow up with PCP for additional refills.   Iron deficiency anemia, she has taken oral iron supplementation and hemoglobin has resolved and she can discontinue iron supplementation.  Mild hypokalemia, K 3.4, recommend increase potassium enriched food.    All  questions were answered.  cc Danelle Berry, NP   Earlie Server, MD, PhD Hematology Oncology [facility] Pager- [phone] 02/08/2020

## 2020-02-10 ENCOUNTER — Telehealth: Payer: Self-pay

## 2020-02-10 NOTE — Telephone Encounter (Signed)
Co-pay for Xarelto 10 mg is $100.  I contacted [facility] oral medication pharmacist Nuala Alpha.  She does have a co-pay assistance available.  Alyson will call the pharmacy to proved co-pay assistance information and then she will contact the patient to inform her of co-pay assistance.

## 2020-02-13 ENCOUNTER — Other Ambulatory Visit: Payer: Self-pay | Admitting: Nurse Practitioner

## 2020-02-13 DIAGNOSIS — Z1231 Encounter for screening mammogram for malignant neoplasm of breast: Secondary | ICD-10-CM

## 2020-02-25 ENCOUNTER — Ambulatory Visit
Admission: RE | Admit: 2020-02-25 | Discharge: 2020-02-25 | Disposition: A | Discharge status: Home / Self Care | Payer: 59 | Source: Ambulatory Visit | Attending: Nurse Practitioner | Admitting: Nurse Practitioner

## 2020-02-25 DIAGNOSIS — Z1231 Encounter for screening mammogram for malignant neoplasm of breast: Secondary | ICD-10-CM | POA: Insufficient documentation

## 2020-05-25 ENCOUNTER — Other Ambulatory Visit: Payer: Self-pay

## 2020-05-25 DIAGNOSIS — Z79899 Other long term (current) drug therapy: Secondary | ICD-10-CM | POA: Diagnosis not present

## 2020-05-25 DIAGNOSIS — I10 Essential (primary) hypertension: Secondary | ICD-10-CM | POA: Insufficient documentation

## 2020-05-25 DIAGNOSIS — M545 Low back pain: Secondary | ICD-10-CM | POA: Insufficient documentation

## 2020-05-25 DIAGNOSIS — M549 Dorsalgia, unspecified: Secondary | ICD-10-CM | POA: Diagnosis present

## 2020-05-25 NOTE — ED Triage Notes (Addendum)
Pt arrives to ED via POV from home with c/o chronic back pain x3 days. Pt reports previous h/x of sciatica; no recent injury, fall, or trauma. No urinary c/o's. Pt reports taking r/x'd medications as directed without relief. Pt is A&O, in NAD; RR even, regular, and unlabored.

## 2020-05-26 ENCOUNTER — Emergency Department: Payer: 59

## 2020-05-26 ENCOUNTER — Emergency Department
Admission: EM | Admit: 2020-05-26 | Discharge: 2020-05-26 | Disposition: A | Discharge status: Home / Self Care | Payer: 59 | Attending: Student in an Organized Health Care Education/Training Program | Admitting: Student in an Organized Health Care Education/Training Program

## 2020-05-26 DIAGNOSIS — M545 Low back pain, unspecified: Secondary | ICD-10-CM

## 2020-05-26 LAB — URINALYSIS, COMPLETE (UACMP) WITH MICROSCOPIC
Bacteria, UA: NONE SEEN
Bilirubin Urine: NEGATIVE
Glucose, UA: NEGATIVE mg/dL
Hgb urine dipstick: NEGATIVE
Ketones, ur: NEGATIVE mg/dL
Leukocytes,Ua: NEGATIVE
Nitrite: NEGATIVE
Protein, ur: NEGATIVE mg/dL
Specific Gravity, Urine: 1.035 — ABNORMAL HIGH (ref 1.005–1.030)
pH: 5 (ref 5.0–8.0)

## 2020-05-26 LAB — POCT PREGNANCY, URINE: Preg Test, Ur: NEGATIVE

## 2020-05-26 MED ORDER — OXYCODONE-ACETAMINOPHEN 5-325 MG PO TABS
2.0000 | ORAL_TABLET | Freq: Once | ORAL | Status: AC
  Administered 2020-05-26: 2 via ORAL
  Filled 2020-05-26: qty 2

## 2020-05-26 MED ORDER — HYDROCODONE-ACETAMINOPHEN 5-325 MG PO TABS: 1.0000 | ORAL_TABLET | Freq: Four times a day (QID) | ORAL | 0 refills | Status: DC | PRN

## 2020-05-26 NOTE — ED Notes (Signed)
Pt. POC was Negative.

## 2020-05-26 NOTE — ED Provider Notes (Signed)
[facility] Provider Note    First MD Initiated Contact with Patient 05/26/20 0302     (approximate)  I have reviewed the triage vital signs and the nursing notes.   Alexandra Montes    HPI Alexandra Montes is a 44 y.o. female below listed past medical Alexandra on Xarelto for Alexandra of DVT presents to the ER for 3 days of positional right flank Montes.  Denies any numbness or tingling.  Has a Alexandra of sciatica but this feels different.  Denies any dysuria or hematuria.  No fevers.  Denies any abdominal Montes.  Denies any Alexandra of kidney stones.  Denies any recent trauma.  No urinary incontinence.  No saddle anesthesia.  No lower extremity weakness.    Past Medical Alexandra:  Diagnosis Date  . Anxiety   . Carpal tunnel syndrome   . Cauliflower ear, right   . DVT of deep femoral vein, left ([place])   . GERD (gastroesophageal reflux disease)   . Headache   . Hypertension   . Ovarian cyst    Right   Family Alexandra  Problem Relation Age of Onset  . Heart disease Mother   . Bipolar disorder Mother   . Drug abuse Mother   . Alcohol abuse Mother   . Breast cancer Mother 30  . Schizophrenia Father   . Lung cancer Maternal Grandfather    Past Surgical Alexandra:  Procedure Laterality Date  . AMPUTATION FINGER    . AMPUTATION TOE    . NO PAST SURGERIES     Patient Active Problem List   Diagnosis Date Noted  . Acute deep vein thrombosis (DVT) of femoral vein of left lower extremity ([place]) 06/29/2019  . Montes of left lower extremity 06/29/2019  . Uterine leiomyoma 09/11/2017  . Slow transit constipation 09/11/2017  . Cyst of right ovary 09/11/2017  . Pelvic Montes in female 09/11/2017      Prior to Admission medications   Medication Sig Start Date End Date Taking? Authorizing Provider  acetaminophen (TYLENOL) 325 MG tablet Take 650 mg by mouth 4 (four) times daily.    [provider]  albuterol (PROAIR  HFA) 108 (90 Base) MCG/ACT inhaler ProAir HFA 108 (90 Base) MCG/ACT Inhalation Aerosol Solution QTY: 1 inhaler Days: 30 Refills: 1  Written: 02/27/18 Patient Instructions: Inhale 1 puff orally every 6 hours as needed for shortness of breath 02/27/18   [provider]  citalopram (CELEXA) 10 MG tablet Take 1 tablet (10 mg total) by mouth daily. 02/12/18   Ursula Alert, MD  docusate sodium (COLACE) 100 MG capsule Take 1 capsule (100 mg total) by mouth daily as needed for mild constipation or moderate constipation. Do not take if you have loose stools Patient not taking: Reported on 02/07/2020 11/01/19   Earlie Server, MD  ergocalciferol (VITAMIN D2) 1.25 MG (50000 UT) capsule Ergocalciferol 1.25 MG (50000 UT) Oral Capsule QTY: 12 capsule Days: 84 Refills: 3  Written: 03/14/19 Patient Instructions: Take 1 capsule by mouth WEEKLY 03/14/19   [provider]  ferrous sulfate 325 (65 FE) MG EC tablet Take 1 tablet (325 mg total) by mouth 3 (three) times daily with meals. Patient taking differently: Take 325 mg by mouth daily with breakfast.  11/01/19   Earlie Server, MD  fluticasone [facility]) 50 MCG/ACT nasal spray Fluticasone Propionate 50 MCG/ACT Nasal Suspension QTY: 1 bottle Days: 30 Refills: 4  Written: 02/19/18 Patient Instructions: Lean head forward, instill 1  spray in each nostril daily with a gentle sniff 02/19/18   [provider]  gabapentin (NEURONTIN) 100 MG capsule 300 mg 3 (three) times daily.  06/14/19   [provider]  hydrochlorothiazide (HYDRODIURIL) 25 MG tablet Take 25 mg by mouth daily. 12/14/17   [provider]  HYDROcodone-acetaminophen (NORCO) 5-325 MG tablet Take 1 tablet by mouth every 6 (six) hours as needed for severe Montes. 05/26/20   Merlyn Lot, MD  loratadine (CLARITIN) 10 MG tablet TAKE 1 TABLET BY MOUTH ONCE DAILY AS NEEDED FOR SEASONAL ALLERGY SYMPTOMS. 01/11/19   [provider]  losartan (COZAAR) 50 MG tablet Take 50 mg by mouth  daily. 01/06/18   [provider]  methocarbamol (ROBAXIN) 500 MG tablet Take 250-500 mg by mouth at bedtime as needed. 11/19/19   [provider]  mupirocin ointment (BACTROBAN) 2 % Apply 1 application topically 3 (three) times daily. 06/05/17   Frederich Cha, MD  omeprazole (PRILOSEC) 40 MG capsule Omeprazole 40 MG Oral Capsule Delayed Release QTY: 30 capsule Days: 30 Refills: 0  Written: 02/26/19 Patient Instructions: Take 1 capsule by mouth daily in AM 30 min prior to breakfast x 1 month 02/26/19   [provider]  rivaroxaban (XARELTO) 10 MG TABS tablet Take 1 tablet (10 mg total) by mouth daily. 02/07/20   Earlie Server, MD  sucralfate (CARAFATE) 1 g tablet Carafate 1 GM Oral Tablet QTY: 120 tablet Days: 30 Refills: 0  Written: 02/26/19 Patient Instructions: Take 1 tablet by mouth four times daily after meals (dissolve in 3 tablespoons of water) 02/26/19   [provider]  traMADol (ULTRAM) 50 MG tablet traMADol HCl 50 MG Oral Tablet QTY: 20 tablet Days: 5 Refills: 0  Written: 06/14/19 Patient Instructions: Take one tablet by mouth every 6 hours as needed 06/14/19   [provider]    Allergies Patient has no known allergies.    Social Alexandra Social Alexandra   Tobacco Use  . Smoking status: Never Smoker  . Smokeless tobacco: Never Used  Vaping Use  . Vaping Use: Never used  Substance Use Topics  . Alcohol use: No  . Drug use: No    Review of Systems Patient denies headaches, rhinorrhea, blurry vision, numbness, shortness of breath, chest Montes, edema, cough, abdominal Montes, nausea, vomiting, diarrhea, dysuria, fevers, rashes or hallucinations unless otherwise stated above in HPI. ____________________________________________   PHYSICAL EXAM:  VITAL SIGNS: Vitals:   05/25/20 2249 05/26/20 0414  BP: (!) 93/51 108/63  Pulse: 68 (!) 59  Resp: 17 18  Temp: 98.4 F (36.9 C)   SpO2: 99% 98%    Constitutional: Alert and oriented.  Eyes:  Conjunctivae are normal.  Head: Atraumatic. Nose: No congestion/rhinnorhea. Mouth/Throat: Mucous membranes are moist.   Neck: No stridor. Painless ROM.  Cardiovascular: Normal rate, regular rhythm. Grossly normal heart sounds.  Good peripheral circulation. Respiratory: Normal respiratory effort.  No retractions. Lungs CTAB. Gastrointestinal: Soft and nontender. No distention. No abdominal bruits. right CVA tenderness. Genitourinary:  Musculoskeletal: ttp of right paralumbar muscles, no step offs or deformities. No lower extremity tenderness nor edema.  No joint effusions. Neurologic:  Normal speech and language. No gross focal neurologic deficits are appreciated. No facial droop Skin:  Skin is warm, dry and intact. No rash noted. Psychiatric: Mood and affect are normal. Speech and behavior are normal.  ____________________________________________   LABS (all labs ordered are listed, but only abnormal results are displayed)  Results for orders placed or performed during the  hospital encounter of 05/26/20 (from the past 24 hour(s))  Urinalysis, Complete w Microscopic     Status: Abnormal   Collection Time: 05/26/20  3:30 AM  Result Value Ref Range   Color, Urine YELLOW (A) YELLOW   APPearance HAZY (A) CLEAR   Specific Gravity, Urine 1.035 (H) 1.005 - 1.030   pH 5.0 5.0 - 8.0   Glucose, UA NEGATIVE NEGATIVE mg/dL   Hgb urine dipstick NEGATIVE NEGATIVE   Bilirubin Urine NEGATIVE NEGATIVE   Ketones, ur NEGATIVE NEGATIVE mg/dL   Protein, ur NEGATIVE NEGATIVE mg/dL   Nitrite NEGATIVE NEGATIVE   Leukocytes,Ua NEGATIVE NEGATIVE   RBC / HPF 0-5 0 - 5 RBC/hpf   WBC, UA 0-5 0 - 5 WBC/hpf   Bacteria, UA NONE SEEN NONE SEEN   Squamous Epithelial / LPF 0-5 0 - 5   Mucus PRESENT   Pregnancy, urine POC     Status: None   Collection Time: 05/26/20  3:32 AM  Result Value Ref Range   Preg Test, Ur NEGATIVE NEGATIVE    ____________________________________________ ____________________________________________  RADIOLOGY  I personally reviewed all radiographic images ordered to evaluate for the above acute complaints and reviewed radiology reports and findings.  These findings were personally discussed with the patient.  Please see medical record for radiology report.  ____________________________________________   PROCEDURES  Procedure(s) performed:  Procedures    Critical Care performed: no ____________________________________________   INITIAL IMPRESSION / ASSESSMENT AND PLAN / ED COURSE  Pertinent labs & imaging results that were available during my care of the patient were reviewed by me and considered in my medical decision making (see chart for details).   DDX: msk strain, lumbago, uti, pyelo, Montes, ss, AAA  HAZELYNN MCKENNY is a 44 y.o. who presents to the ED with symptoms as described above.  Patient well and nontoxic-appearing does appear uncomfortable with reproducible right lower paralumbar tenderness to palpation.  No step-off deformity.  No neuro deficits.  Abdominal exam is soft and benign.  Will check urine to see if there is any evidence hematuria.  Will order KUB to evaluate for stone.  Will give Montes medication and reassess.  Clinical Course as of May 26 430  Tue May 26, 2020  2536 Patient reassessed.  Feels significant improvement with Montes.  Review of her medical records does have previous imaging with no evidence of AAA.  She does not have any red flag symptoms.  Not consistent with cauda equina or spinal stenosis.  Likely musculoskeletal strain.  I did offer trigger point injection patient declined this.  Will provide short course of Montes control referral.  Patient was able to tolerate PO and was able to ambulate with a steady gait.Have discussed with the patient and available family all diagnostics and treatments performed thus far and all questions were answered to the best of  my ability. The patient demonstrates understanding and agreement with plan.    [PR]    Clinical Course User Index [PR] Merlyn Lot, MD    The patient was evaluated in Emergency Department today for the symptoms described in the Alexandra of present illness. He/she was evaluated in the context of the global COVID-19 pandemic, which necessitated consideration that the patient might be at risk for infection with the SARS-CoV-2 virus that causes COVID-19. Institutional protocols and algorithms that pertain to the evaluation of patients at risk for COVID-19 are in a state of rapid change based on information released by regulatory bodies including the CDC and federal and  state organizations. These policies and algorithms were followed during the patient's care in the ED.  As part of my medical decision making, I reviewed the following data within the [place] notes reviewed and incorporated, Labs reviewed, notes from prior ED visits and [place] Controlled Substance Database   ____________________________________________   FINAL CLINICAL IMPRESSION(S) / ED DIAGNOSES  Final diagnoses:  Acute right-sided low back Montes without sciatica      NEW MEDICATIONS STARTED DURING THIS VISIT:  New Prescriptions   HYDROCODONE-ACETAMINOPHEN (NORCO) 5-325 MG TABLET    Take 1 tablet by mouth every 6 (six) hours as needed for severe Montes.     Note:  This document was prepared using Dragon voice recognition software and may include unintentional dictation errors.    Merlyn Lot, MD 05/26/20 [phone]

## 2020-05-26 NOTE — ED Notes (Signed)
Pt reports back pain that is chronic but worse. See triage note.

## 2020-05-26 NOTE — Discharge Instructions (Addendum)

## 2020-06-27 ENCOUNTER — Other Ambulatory Visit: Payer: Self-pay

## 2020-06-27 ENCOUNTER — Emergency Department
Admission: EM | Admit: 2020-06-27 | Discharge: 2020-06-27 | Disposition: A | Discharge status: Home / Self Care | Payer: 59 | Attending: Emergency Medicine | Admitting: Emergency Medicine

## 2020-06-27 ENCOUNTER — Encounter: Payer: Self-pay | Admitting: Emergency Medicine

## 2020-06-27 DIAGNOSIS — R112 Nausea with vomiting, unspecified: Secondary | ICD-10-CM | POA: Diagnosis not present

## 2020-06-27 DIAGNOSIS — R197 Diarrhea, unspecified: Secondary | ICD-10-CM | POA: Diagnosis not present

## 2020-06-27 DIAGNOSIS — Z5321 Procedure and treatment not carried out due to patient leaving prior to being seen by health care provider: Secondary | ICD-10-CM | POA: Insufficient documentation

## 2020-06-27 LAB — COMPREHENSIVE METABOLIC PANEL
ALT: 17 U/L (ref 0–44)
AST: 16 U/L (ref 15–41)
Albumin: 3.6 g/dL (ref 3.5–5.0)
Alkaline Phosphatase: 68 U/L (ref 38–126)
Anion gap: 9 (ref 5–15)
BUN: 16 mg/dL (ref 6–20)
CO2: 27 mmol/L (ref 22–32)
Calcium: 8.8 mg/dL — ABNORMAL LOW (ref 8.9–10.3)
Chloride: 103 mmol/L (ref 98–111)
Creatinine, Ser: 0.65 mg/dL (ref 0.44–1.00)
GFR calc Af Amer: >60 mL/min (ref >60)
GFR calc non Af Amer: >60 mL/min (ref >60)
Glucose, Bld: 121 mg/dL — ABNORMAL HIGH (ref 70–99)
Potassium: 3.9 mmol/L (ref 3.5–5.1)
Sodium: 139 mmol/L (ref 135–145)
Total Bilirubin: 0.9 mg/dL (ref 0.3–1.2)
Total Protein: 7.4 g/dL (ref 6.5–8.1)

## 2020-06-27 LAB — CBC
HCT: 36.4 % (ref 36.0–46.0)
Hemoglobin: 11.8 g/dL — ABNORMAL LOW (ref 12.0–15.0)
MCH: 29.5 pg (ref 26.0–34.0)
MCHC: 32.4 g/dL (ref 30.0–36.0)
MCV: 91 fL (ref 80.0–100.0)
Platelets: 205 10*3/uL (ref 150–400)
RBC: 4 MIL/uL (ref 3.87–5.11)
RDW: 12.8 % (ref 11.5–15.5)
WBC: 8.5 10*3/uL (ref 4.0–10.5)
nRBC: 0 % (ref 0.0–0.2)

## 2020-06-27 LAB — LIPASE, BLOOD: Lipase: 24 U/L (ref 11–51)

## 2020-06-27 MED ORDER — SODIUM CHLORIDE 0.9% FLUSH: 3.0000 mL | Freq: Once | INTRAVENOUS | Status: DC

## 2020-06-27 NOTE — ED Notes (Signed)
Pt called for x 3, no answer.

## 2020-06-27 NOTE — ED Notes (Signed)
Called x2 by this RN

## 2020-06-27 NOTE — ED Notes (Signed)
Called x1 by this RN

## 2020-06-27 NOTE — ED Triage Notes (Signed)
Pt reports received 2nd Covid vaccine yesterday, pt reports this morning at approx 0300 this morning began having N/V/D. Pt reports feeling some better, c/o continued nausea.Pt denies pain at this time.

## 2020-07-03 DIAGNOSIS — M479 Spondylosis, unspecified: Secondary | ICD-10-CM | POA: Insufficient documentation

## 2020-07-06 ENCOUNTER — Other Ambulatory Visit: Payer: Self-pay | Admitting: Nurse Practitioner

## 2020-07-06 DIAGNOSIS — M47816 Spondylosis without myelopathy or radiculopathy, lumbar region: Secondary | ICD-10-CM

## 2020-07-06 DIAGNOSIS — M5416 Radiculopathy, lumbar region: Secondary | ICD-10-CM

## 2020-07-28 ENCOUNTER — Ambulatory Visit
Admission: RE | Admit: 2020-07-28 | Discharge: 2020-07-28 | Disposition: A | Discharge status: Home / Self Care | Payer: 59 | Source: Ambulatory Visit | Attending: Nurse Practitioner | Admitting: Nurse Practitioner

## 2020-07-28 ENCOUNTER — Other Ambulatory Visit: Payer: Self-pay

## 2020-07-28 DIAGNOSIS — M5416 Radiculopathy, lumbar region: Secondary | ICD-10-CM | POA: Diagnosis present

## 2020-07-28 DIAGNOSIS — M47816 Spondylosis without myelopathy or radiculopathy, lumbar region: Secondary | ICD-10-CM | POA: Diagnosis present

## 2020-09-29 ENCOUNTER — Encounter: Payer: Self-pay | Admitting: Student in an Organized Health Care Education/Training Program

## 2020-09-29 ENCOUNTER — Ambulatory Visit
Payer: 59 | Attending: Student in an Organized Health Care Education/Training Program | Admitting: Student in an Organized Health Care Education/Training Program

## 2020-09-29 ENCOUNTER — Other Ambulatory Visit: Payer: Self-pay

## 2020-09-29 VITALS — BP 109/65 | HR 77 | Temp 98.0°F | Resp 16 | Ht 59.0 in | Wt 170.0 lb

## 2020-09-29 DIAGNOSIS — M47816 Spondylosis without myelopathy or radiculopathy, lumbar region: Secondary | ICD-10-CM | POA: Insufficient documentation

## 2020-09-29 DIAGNOSIS — M5416 Radiculopathy, lumbar region: Secondary | ICD-10-CM | POA: Insufficient documentation

## 2020-09-29 DIAGNOSIS — G8929 Other chronic pain: Secondary | ICD-10-CM | POA: Insufficient documentation

## 2020-09-29 DIAGNOSIS — G894 Chronic pain syndrome: Secondary | ICD-10-CM | POA: Diagnosis not present

## 2020-09-29 MED ORDER — TIZANIDINE HCL 4 MG PO TABS: 4.0000 mg | ORAL_TABLET | Freq: Two times a day (BID) | ORAL | 0 refills | Status: AC | PRN

## 2020-09-29 NOTE — Progress Notes (Signed)
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 palpable anomalies  Palpation: No palpable anomalies   Assessment  Primary Diagnosis & Pertinent Problem List: The primary encounter diagnosis was Lumbar radiculopathy. Diagnoses of Chronic radicular lumbar pain, Lumbar facet arthropathy, Lumbar spondylosis, and Chronic pain syndrome were also pertinent to this visit.  Visit Diagnosis (New problems to examiner): 1. Lumbar radiculopathy   2. Chronic radicular lumbar pain   3. Lumbar facet arthropathy   4. Lumbar spondylosis   5. Chronic pain syndrome    Plan of Care (Initial workup plan)  General Recommendations: The pain condition that the patient suffers from is best treated with a multidisciplinary approach that involves an increase in physical activity  to prevent de-conditioning and  worsening of the pain cycle, as well as psychological counseling (formal and/or informal) to address the co-morbid psychological affects of pain. Treatment will often involve judicious use of pain medications and interventional procedures to decrease the pain, allowing the patient to participate in the physical activity that will ultimately produce long-lasting pain reductions. The goal of the multidisciplinary approach is to return the patient to a higher level of overall function and to restore their ability to perform activities of daily living.   Brittni Hult is a pleasant 44 year old female who presents with a chief complaint of low back pain with radiation into her left lateral leg in a dermatomal fashion.  Patient states that this started after she had a DVT of her left lower extremity.  She is currently anticoagulated on Xarelto for left lower extremity DVT.  She is currently on gabapentin 300 mg 3 times a day which was escalated from 100 mg 3 times a day.  She is noticing some benefit from this.  I instructed her to increase her nighttime dose to 600 mg.  Furthermore, the patient is not experiencing any significant pain relief from Robaxin.  I recommend that she try tizanidine as below for lower back muscle spasms.  I advised against the use of chronic opioid therapy for this condition.  She has tried tramadol and hydrocodone and hydrocodone in the past which were not effective.  Do not recommend NSAID as she is already on Xarelto for DVT treatment.  I did offer the patient a left lumbar epidural steroid injection for her lumbar radicular pain.  Patient is somewhat hesitant about this.  She wants to try medication management first and if not effective she will consider injection therapy.  This is reasonable.  I will have the patient follow-up in 2 months and if she is not doing better, we can schedule her for a lumbar epidural steroid injection.  Pharmacotherapy (current): Medications ordered:  Meds  ordered this encounter  Medications   tiZANidine (ZANAFLEX) 4 MG tablet    Sig: Take 1 tablet (4 mg total) by mouth 2 (two) times daily as needed for muscle spasms.    Dispense:  60 tablet    Refill:  0    Do not place this medication, or any other prescription from our practice, on "Automatic Refill". Patient may have prescription filled one day early if pharmacy is closed on scheduled refill date.   Medications administered during this visit: Alexandra Montes had no medications administered during this visit.   Pharmacological management options:  Opioid Analgesics: Avoid, will focus on interventional and nonopioid-based pain management  Membrane stabilizer: Continue gabapentin as prescribed however increase nighttime dose to 600 so that you are taking 300, 300, 600  Muscle relaxant: Stop Robaxin.  Trial of tizanidine as above  NSAID: Medically contraindicated on Xarelto  Other analgesic(s): To be determined at a later time   Interventional management options: Alexandra Montes was informed that there is no guarantee that she would be a candidate for interventional therapies. The decision will be based on the results of diagnostic studies, as well as Alexandra Montes risk profile.  Procedure(s) under consideration:  Lumbar epidural steroid injection Lumbar facet medial branch nerve blocks   Provider-requested follow-up: Return in about 6 weeks (around 11/10/2020) for Medication Management, in person.  Future Appointments  Date Time Provider [place]  11/09/2020  2:20 PM Gillis Santa, MD [facility] None    Note by: Gillis Santa, MD Date: 09/29/2020;  Time: 3:39 PM

## 2020-09-29 NOTE — Patient Instructions (Addendum)
1. Increase Gabapentin to 300/300/600 2. Stop Robaxin, start Tizanidine 3. Consider TENS unit (from [facility] or [facility]) Pain Management Discharge Instructions  General Discharge Instructions :  If you need to reach your doctor call: Monday-Friday 8:00 am - 4:00 pm at [phone] or toll free [phone].  After clinic hours [phone] to have operator reach doctor.  Bring all of your medication bottles to all your appointments in the pain clinic.  To cancel or reschedule your appointment with Pain Management please remember to call 24 hours in advance to avoid a fee.  Refer to the educational materials which you have been given on: General Risks, I had my Procedure. Discharge Instructions, Post Sedation.  Post Procedure Instructions:  The drugs you were given will stay in your system until tomorrow, so for the next 24 hours you should not drive, make any legal decisions or drink any alcoholic beverages.  You may eat anything you prefer, but it is better to start with liquids then soups and crackers, and gradually work up to solid foods.  Please notify your doctor immediately if you have any unusual bleeding, trouble breathing or pain that is not related to your normal pain.  Depending on the type of procedure that was done, some parts of your body may feel week and/or numb.  This usually clears up by tonight or the next day.  Walk with the use of an assistive device or accompanied by an adult for the 24 hours.  You may use ice on the affected area for the first 24 hours.  Put ice in a Ziploc bag and cover with a towel and place against area 15 minutes on 15 minutes off.  You may switch to heat after 24 hours.

## 2020-09-29 NOTE — Progress Notes (Signed)
Safety precautions to be maintained throughout the outpatient stay will include: orient to surroundings, keep bed in low position, maintain call bell within reach at all times, provide assistance with transfer out of bed and ambulation.  

## 2020-10-06 ENCOUNTER — Other Ambulatory Visit: Payer: Self-pay

## 2020-10-09 ENCOUNTER — Other Ambulatory Visit: Payer: Self-pay

## 2020-10-23 ENCOUNTER — Ambulatory Visit (LOCAL_COMMUNITY_HEALTH_CENTER): Payer: Medicaid Other

## 2020-10-23 ENCOUNTER — Other Ambulatory Visit: Payer: Self-pay

## 2020-10-23 DIAGNOSIS — Z111 Encounter for screening for respiratory tuberculosis: Secondary | ICD-10-CM

## 2020-10-26 ENCOUNTER — Ambulatory Visit (LOCAL_COMMUNITY_HEALTH_CENTER): Payer: Medicaid Other

## 2020-10-26 ENCOUNTER — Other Ambulatory Visit: Payer: Self-pay

## 2020-10-26 DIAGNOSIS — Z111 Encounter for screening for respiratory tuberculosis: Secondary | ICD-10-CM

## 2020-10-26 LAB — TB SKIN TEST
Induration: 0 mm
TB Skin Test: NEGATIVE

## 2020-11-09 ENCOUNTER — Ambulatory Visit
Payer: 59 | Attending: Student in an Organized Health Care Education/Training Program | Admitting: Student in an Organized Health Care Education/Training Program

## 2020-11-09 ENCOUNTER — Other Ambulatory Visit: Payer: Self-pay

## 2020-11-09 ENCOUNTER — Encounter: Payer: Self-pay | Admitting: Student in an Organized Health Care Education/Training Program

## 2020-11-09 VITALS — BP 102/65 | HR 70 | Temp 98.2°F | Resp 18 | Ht 59.0 in | Wt 181.0 lb

## 2020-11-09 DIAGNOSIS — G8929 Other chronic pain: Secondary | ICD-10-CM | POA: Insufficient documentation

## 2020-11-09 DIAGNOSIS — G894 Chronic pain syndrome: Secondary | ICD-10-CM | POA: Diagnosis not present

## 2020-11-09 DIAGNOSIS — M5416 Radiculopathy, lumbar region: Secondary | ICD-10-CM | POA: Insufficient documentation

## 2020-11-09 DIAGNOSIS — M47816 Spondylosis without myelopathy or radiculopathy, lumbar region: Secondary | ICD-10-CM

## 2020-11-09 MED ORDER — TIZANIDINE HCL 4 MG PO TABS: 4.0000 mg | ORAL_TABLET | Freq: Three times a day (TID) | ORAL | 0 refills | Status: AC | PRN

## 2020-11-09 MED ORDER — PREGABALIN 50 MG PO CAPS: 50.0000 mg | ORAL_CAPSULE | Freq: Three times a day (TID) | ORAL | 1 refills | Status: DC

## 2020-11-09 NOTE — Progress Notes (Signed)
Nursing Pain Medication Assessment:  Safety precautions to be maintained throughout the outpatient stay will include: orient to surroundings, keep bed in low position, maintain call bell within reach at all times, provide assistance with transfer out of bed and ambulation.  Medication Inspection Compliance: Alexandra Montes did not comply with our request to bring her pills to be counted. She was reminded that bringing the medication bottles, even when empty, is a requirement.  Medication: None brought in. Pill/Patch Count: None available to be counted. Bottle Appearance: No container available. Did not bring bottle(s) to appointment. Filled Date: N/A Last Medication intake:  Today

## 2020-11-09 NOTE — Progress Notes (Signed)
PROVIDER NOTE: Information contained herein reflects review and annotations entered in association with encounter. Interpretation of such information and data should be left to medically-trained personnel. Information provided to patient can be located elsewhere in the medical record under "Patient Instructions". Document created using STT-dictation technology, any transcriptional errors that may result from process are unintentional.    Patient: Alexandra Montes  Service Category: E/M  Provider: Gillis Santa, MD  DOB: 1976/01/25  DOS: 11/09/2020  Specialty: Interventional Pain Management  MRN: [id number]  Setting: Ambulatory outpatient  PCP: Danelle Berry, NP  Type: Established Patient    Referring Provider: Danelle Berry, NP  Location: Office  Delivery: Face-to-face     HPI  Alexandra Montes, a 44 y.o. year old female, is here today because of her Lumbar radiculopathy [M54.16]. Alexandra Montes primary complain today is Back Pain (low) and Hip Pain (Bilateral, radiating to buttocks bilaterally) Last encounter: My last encounter with her was on 09/29/2020. Pertinent problems: Alexandra Montes has Pain of left lower extremity; Chronic radicular lumbar pain; Lumbar spondylosis; and Chronic pain syndrome on their pertinent problem list. Pain Assessment: Severity of Chronic pain is reported as a 8 /10. Location: Back Lower/hips and buttocks bilaterally. Onset: More than a month ago. Quality: Constant,Aching,Radiating. Timing: Constant. Modifying factor(s): medication, ice, topicals. Vitals:  height is 4' 11"  (1.499 m) and weight is 181 lb (82.1 kg). Her temporal temperature is 98.2 F (36.8 C). Her blood pressure is 102/65 and her pulse is 70. Her respiration is 18.   Reason for encounter: medication management.   Patient presents today for medication management.  She was initially very short with me and responding to my questions with one-word answers.  When asked her how she was doing with the tizanidine,  she said "okay".  She is requesting opioid medications and I made it clear on her initial visit that given her condition, we should explore nonopioid analgesics and interventional pain therapies which have a greater likelihood of addressing her pain then starting chronic opioid therapy.  Patient is somewhat argumentative and kept saying how the pain medication at the hospital took her pain away.  Again I reinforced the risks of these medications and offered her a lumbar epidural steroid injection, diagnostic, for her lumbar radicular pain however the patient is reluctant to get this done.  She states "I do not know why I cannot get pain medications when my friends do".  I found this concerning.  Patient displays drug-seeking behavior as she was somewhat rude with me during this visit and her last visit since I informed her that I would not be prescribing her opioid therapy.  I offered her Lyrica as a substitute to gabapentin since she states the gabapentin does not help.  Do not recommend chronic NSAIDs at this point since the patient is on Xarelto.  I also offered to increase her tizanidine which she states does provide some benefit.  If she would like to consider a lumbar epidural steroid injection in the future, we would need cardiac clearance to stop her Xarelto at which point I would be happy to offer her a lumbar ESI as a diagnostic/therapeutic therapy.  At this point, since I am not managing any opioid analgesics, I recommend the patient follow-up with her primary care provider for continuation of Lyrica and tizanidine.  I will be happy to see her in the future if she would like to consider a lumbar ESI.  ROS  Constitutional: Denies any fever or  chills Gastrointestinal: No reported hemesis, hematochezia, vomiting, or acute GI distress Musculoskeletal: Denies any acute onset joint swelling, redness, loss of ROM, or weakness Neurological: No reported episodes of acute onset apraxia, aphasia,  dysarthria, agnosia, amnesia, paralysis, loss of coordination, or loss of consciousness  Medication Review  acetaminophen, albuterol, citalopram, ergocalciferol, fluticasone, hydrochlorothiazide, loratadine, losartan, mupirocin ointment, omeprazole, pregabalin, rivaroxaban, sucralfate, and tiZANidine  History Review  Allergy: Alexandra Montes has No Known Allergies. Drug: Alexandra Montes  reports no history of drug use. Alcohol:  reports no history of alcohol use. Tobacco:  reports that she has never smoked. She has never used smokeless tobacco. Social: Alexandra Montes  reports that she has never smoked. She has never used smokeless tobacco. She reports that she does not drink alcohol and does not use drugs. Medical:  has a past medical history of Anxiety, Carpal tunnel syndrome, Cauliflower ear, right, DVT of deep femoral vein, left ([place]), GERD (gastroesophageal reflux disease), Headache, Hypertension, and Ovarian cyst. Surgical: Alexandra Montes  has a past surgical history that includes No past surgeries; Amputation finger; and Amputation toe. Family: family history includes Alcohol abuse in her mother; Bipolar disorder in her mother; Breast cancer (age of onset: 58) in her mother; Drug abuse in her mother; Heart disease in her mother; Lung cancer in her maternal grandfather; Schizophrenia in her father.  Laboratory Chemistry Profile   Renal Lab Results  Component Value Date   BUN 16 06/27/2020   CREATININE 0.65 06/27/2020   GFRAA >60 06/27/2020   GFRNONAA >60 06/27/2020     Hepatic Lab Results  Component Value Date   AST 16 06/27/2020   ALT 17 06/27/2020   ALBUMIN 3.6 06/27/2020   ALKPHOS 68 06/27/2020   LIPASE 24 06/27/2020     Electrolytes Lab Results  Component Value Date   NA 139 06/27/2020   K 3.9 06/27/2020   CL 103 06/27/2020   CALCIUM 8.8 (L) 06/27/2020     Bone No results found for: VD25OH, VD125OH2TOT, DZ3299ME2, AS3419QQ2, 25OHVITD1, 25OHVITD2, 25OHVITD3, TESTOFREE, TESTOSTERONE    Inflammation (CRP: Acute Phase) (ESR: Chronic Phase) No results found for: CRP, ESRSEDRATE, LATICACIDVEN     Note: Above Lab results reviewed.  Recent Imaging Review  MR LUMBAR SPINE WO CONTRAST CLINICAL DATA:  Initial evaluation for left lower extremity pain for 1 year.  EXAM: MRI LUMBAR SPINE WITHOUT CONTRAST  TECHNIQUE: Multiplanar, multisequence MR imaging of the lumbar spine was performed. No intravenous contrast was administered.  COMPARISON:  Prior MRI from 10/23/2019.  FINDINGS: Segmentation: Standard. Lowest well-formed disc space labeled the L5-S1 level.  Alignment: Physiologic with preservation of the normal lumbar lordosis. No listhesis.  Vertebrae: Vertebral body height maintained without acute or chronic fracture. Bone marrow signal intensity diffusely decreased on T1 weighted imaging, nonspecific, but most commonly related to anemia, smoking, or obesity. No discrete or worrisome osseous lesions. No abnormal marrow edema.  Conus medullaris and cauda equina: Conus extends to the L2 level. Conus and cauda equina appear normal.  Paraspinal and other soft tissues: Paraspinous soft tissues within normal limits. Visualized visceral structures are unremarkable.  Disc levels:  T11-12: Disc bulge with disc desiccation and small central disc protrusion. No significant canal or foraminal stenosis.  T12-L1: Minimal endplate spurring.  No stenosis or impingement.  L1-2:  Mild disc bulge.  No significant canal or foraminal stenosis.  L2-3: Minimal disc bulge. No significant canal or foraminal stenosis.  L3-4: Mild diffuse disc bulge with disc desiccation and intervertebral disc space narrowing. Mild bilateral  facet hypertrophy. No significant canal or lateral recess stenosis. Mild bilateral L3 foraminal narrowing. Appearance is stable.  L4-5: Mild diffuse disc bulge with disc desiccation. Associated mild reactive endplate spurring. Mild bilateral facet  hypertrophy. No significant canal or lateral recess stenosis. Mild bilateral L4 foraminal narrowing. Appearance is stable.  L5-S1: Mild annular disc bulge with reactive endplate spurring. Mild bilateral facet hypertrophy, greater on the right. No significant canal or lateral recess stenosis. Mild bilateral L5 foraminal narrowing. Appearance is stable.  IMPRESSION: 1. No significant interval change in appearance of the lumbar spine as compared to 10/24/2019. 2. Mild lower lumbar degenerative spondylosis with resultant mild L3 through L5 foraminal stenosis. No significant spinal stenosis or overt neural impingement. 3. Decreased signal intensity within the visualized bone marrow, nonspecific, but most commonly related to anemia, smoking, or obesity.  Electronically Signed   By: Jeannine Boga M.D.   On: 07/28/2020 21:03 Note: Reviewed        Physical Exam  General appearance: Well nourished, well developed, and well hydrated. In no apparent acute distress Mental status: Alert, oriented x 3 (person, place, & time)       Respiratory: No evidence of acute respiratory distress Eyes: PERLA Vitals: BP 102/65   Pulse 70   Temp 98.2 F (36.8 C) (Temporal)   Resp 18   Ht 4' 11"  (1.499 m)   Wt 181 lb (82.1 kg)   LMP 10/14/2020   BMI 36.56 kg/m  BMI: Estimated body mass index is 36.56 kg/m as calculated from the following:   Height as of this encounter: 4' 11"  (1.499 m).   Weight as of this encounter: 181 lb (82.1 kg). Ideal: Patient must be at least 60 in tall to calculate ideal body weight   Lumbar Exam  Skin & Axial Inspection: No masses, redness, or swelling Alignment: Symmetrical Functional ROM: Pain restricted ROM affecting primarily the left Stability: No instability detected Muscle Tone/Strength: Functionally intact. No obvious neuro-muscular anomalies detected. Sensory (Neurological): Dermatomal pain pattern L>>R Palpation: No palpable anomalies        Provocative Tests: Hyperextension/rotation test: deferred today       Lumbar quadrant test (Kemp's test): (+) on the left for foraminal stenosis Lateral bending test: (+) ipsilateral radicular pain, on the left. Positive for left-sided foraminal stenosis. Patrick's Maneuver: deferred today                   FABER* test: deferred today                   S-I anterior distraction/compression test: deferred today         S-I lateral compression test: deferred today         S-I Thigh-thrust test: deferred today         S-I Gaenslen's test: deferred today         *(Flexion, ABduction and External Rotation)  Gait & Posture Assessment  Ambulation: Unassisted Gait: Relatively normal for age and body habitus Posture: WNL   Lower Extremity Exam    Side: Right lower extremity  Side: Left lower extremity  Stability: No instability observed          Stability: No instability observed          Skin & Extremity Inspection: Skin color, temperature, and hair growth are WNL. No peripheral edema or cyanosis. No masses, redness, swelling, asymmetry, or associated skin lesions. No contractures.  Skin & Extremity Inspection: Skin color, temperature, and hair growth are WNL.  No peripheral edema or cyanosis. No masses, redness, swelling, asymmetry, or associated skin lesions. No contractures.  Functional ROM: Unrestricted ROM                  Functional ROM: Pain restricted ROM for hip and knee joints          Muscle Tone/Strength: Functionally intact. No obvious neuro-muscular anomalies detected.  Muscle Tone/Strength: Functionally intact. No obvious neuro-muscular anomalies detected.  Sensory (Neurological): Unimpaired        Sensory (Neurological): Dermatomal pain pattern        DTR: Patellar: deferred today Achilles: deferred today Plantar: deferred today  DTR: Patellar: deferred today Achilles: deferred today Plantar: deferred today  Palpation: No palpable anomalies  Palpation: No palpable  anomalies     Assessment   Status Diagnosis  Persistent Persistent Persistent 1. Lumbar radiculopathy   2. Chronic radicular lumbar pain   3. Lumbar facet arthropathy   4. Lumbar spondylosis   5. Chronic pain syndrome      Updated Problems: Problem  Chronic Radicular Lumbar Pain  Lumbar Spondylosis  Chronic Pain Syndrome  Pain of Left Lower Extremity    Plan of Care   Alexandra Montes has a current medication list which includes the following long-term medication(s): albuterol, citalopram, fluticasone, hydrochlorothiazide, loratadine, losartan, omeprazole, rivaroxaban, sucralfate, and pregabalin.  Pharmacotherapy (Medications Ordered): Meds ordered this encounter  Medications  . pregabalin (LYRICA) 50 MG capsule    Sig: Take 1 capsule (50 mg total) by mouth 3 (three) times daily.    Dispense:  90 capsule    Refill:  1    Fill one day early if pharmacy is closed on scheduled refill date. May substitute for generic if available.  Marland Kitchen tiZANidine (ZANAFLEX) 4 MG tablet    Sig: Take 1 tablet (4 mg total) by mouth every 8 (eight) hours as needed for muscle spasms.    Dispense:  90 tablet    Refill:  0    Do not place this medication, or any other prescription from our practice, on "Automatic Refill". Patient may have prescription filled one day early if pharmacy is closed on scheduled refill date.    Follow-up plan:   Return follow up with PCP, can return if she wants injection.   Recent Visits Date Type Provider Dept  09/29/20 Office Visit Gillis Santa, MD [facility]  Showing recent visits within past 90 days and meeting all other requirements Today's Visits Date Type Provider Dept  11/09/20 Office Visit Gillis Santa, MD [facility]  Showing today's visits and meeting all other requirements Future Appointments No visits were found meeting these conditions. Showing future appointments within next 90 days and meeting all other requirements  I  discussed the assessment and treatment plan with the patient. The patient was provided an opportunity to ask questions and all were answered. The patient agreed with the plan and demonstrated an understanding of the instructions.  Patient advised to call back or seek an in-person evaluation if the symptoms or condition worsens.  Duration of encounter: 31mnutes.  Note by: BGillis Santa MD Date: 11/09/2020; Time: 3:47 PM

## 2020-11-24 ENCOUNTER — Encounter (INDEPENDENT_AMBULATORY_CARE_PROVIDER_SITE_OTHER): Payer: Self-pay | Admitting: Nurse Practitioner

## 2020-11-27 ENCOUNTER — Encounter (INDEPENDENT_AMBULATORY_CARE_PROVIDER_SITE_OTHER): Payer: Self-pay | Admitting: Nurse Practitioner

## 2020-12-15 ENCOUNTER — Encounter (INDEPENDENT_AMBULATORY_CARE_PROVIDER_SITE_OTHER): Payer: Self-pay

## 2020-12-15 ENCOUNTER — Other Ambulatory Visit: Payer: Self-pay

## 2020-12-15 ENCOUNTER — Encounter (INDEPENDENT_AMBULATORY_CARE_PROVIDER_SITE_OTHER): Payer: 59 | Admitting: Nurse Practitioner

## 2020-12-22 ENCOUNTER — Encounter (INDEPENDENT_AMBULATORY_CARE_PROVIDER_SITE_OTHER): Payer: 59 | Admitting: Nurse Practitioner

## 2020-12-22 ENCOUNTER — Encounter (INDEPENDENT_AMBULATORY_CARE_PROVIDER_SITE_OTHER): Payer: Self-pay

## 2021-01-13 ENCOUNTER — Encounter (INDEPENDENT_AMBULATORY_CARE_PROVIDER_SITE_OTHER): Payer: 59 | Admitting: Nurse Practitioner

## 2021-01-18 ENCOUNTER — Ambulatory Visit (INDEPENDENT_AMBULATORY_CARE_PROVIDER_SITE_OTHER): Payer: 59 | Admitting: Nurse Practitioner

## 2021-01-18 ENCOUNTER — Encounter (INDEPENDENT_AMBULATORY_CARE_PROVIDER_SITE_OTHER): Payer: Self-pay | Admitting: Nurse Practitioner

## 2021-01-18 ENCOUNTER — Other Ambulatory Visit: Payer: Self-pay

## 2021-01-18 VITALS — BP 112/73 | HR 76 | Ht 59.0 in | Wt 174.0 lb

## 2021-01-18 DIAGNOSIS — M79605 Pain in left leg: Secondary | ICD-10-CM

## 2021-01-18 DIAGNOSIS — G8929 Other chronic pain: Secondary | ICD-10-CM

## 2021-01-18 DIAGNOSIS — M5416 Radiculopathy, lumbar region: Secondary | ICD-10-CM

## 2021-01-18 DIAGNOSIS — I10 Essential (primary) hypertension: Secondary | ICD-10-CM | POA: Diagnosis not present

## 2021-01-18 DIAGNOSIS — M7989 Other specified soft tissue disorders: Secondary | ICD-10-CM

## 2021-01-18 NOTE — Progress Notes (Signed)
Subjective:    Patient ID: Alexandra Montes, female    DOB: Sep 12, 1976, 45 y.o.   MRN: [id number] Chief Complaint  Patient presents with  . New Patient (Initial Visit)    [place]. BIL LE edema    Patient presents today as a new patient from Ms. Alexandra Grimes, NP for evaluation of lower extremity edema.  The patient notes that she has pain and swelling of her bilateral lower extremities.  The patient's biggest complaint is of pain that extends from her hips down to her lower calf area.  The patient notes that the pain tends to be constant and hurts even with palpation.  The patient was recently given some muscle relaxers and that helps with her pain but it makes her tired.  She notes that rest helps the pain in her lower extremities.  The patient also has a history of lumbar radiculopathy.  The patient notes that in terms of swelling the swelling only happens 2 or 3 days a week.  Elevation helps his legs as well.  She is try medical grade compression stockings however these were very painful for her.  The patient's has a previous history of a DVT in her left lower extremity.  Patient does not note one leg being more swollen than the other.  She denies any fever, chills, nausea, vomiting or diarrhea.   Review of Systems  Cardiovascular: Positive for leg swelling.  Musculoskeletal: Positive for arthralgias and back pain.  All other systems reviewed and are negative.      Objective:   Physical Exam Vitals reviewed.  HENT:     Head: Normocephalic.  Cardiovascular:     Rate and Rhythm: Normal rate.     Pulses: Normal pulses.  Pulmonary:     Effort: Pulmonary effort is normal.  Musculoskeletal:     Right lower leg: 1+ Edema present.     Left lower leg: 1+ Edema present.  Neurological:     Mental Status: She is alert and oriented to person, place, and time.  Psychiatric:        Mood and Affect: Mood normal.        Behavior: Behavior normal.        Thought Content: Thought content normal.         Judgment: Judgment normal.     BP 112/73   Pulse 76   Ht 4\' 11"  (1.499 m)   Wt 174 lb (78.9 kg)   BMI 35.14 kg/m   Past Medical History:  Diagnosis Date  . Anxiety   . Carpal tunnel syndrome   . Cauliflower ear, right   . DVT of deep femoral vein, left ([place])   . GERD (gastroesophageal reflux disease)   . Headache   . Hypertension   . Ovarian cyst    Right    Social History   Socioeconomic History  . Marital status: Divorced    Spouse name: Not on file  . Number of children: 0  . Years of education: Not on file  . Highest education level: GED or equivalent  Occupational History  . Not on file  Tobacco Use  . Smoking status: Never Smoker  . Smokeless tobacco: Never Used  Vaping Use  . Vaping Use: Never used  Substance and Sexual Activity  . Alcohol use: No  . Drug use: No  . Sexual activity: Yes    Birth control/protection: None  Other Topics Concern  . Not on file  Social History Narrative  . Not on  file   Social Determinants of Health   Financial Resource Strain: Not on file  Food Insecurity: Not on file  Transportation Needs: Not on file  Physical Activity: Not on file  Stress: Not on file  Social Connections: Not on file  Intimate Partner Violence: Not on file    Past Surgical History:  Procedure Laterality Date  . AMPUTATION FINGER    . AMPUTATION TOE    . NO PAST SURGERIES      Family History  Problem Relation Age of Onset  . Heart disease Mother   . Bipolar disorder Mother   . Drug abuse Mother   . Alcohol abuse Mother   . Breast cancer Mother 40  . Schizophrenia Father   . Lung cancer Maternal Grandfather     No Known Allergies  CBC Latest Ref Rng & Units 06/27/2020 02/07/2020 10/31/2019  WBC 4.0 - 10.5 K/uL 8.5 4.9 5.4  Hemoglobin 12.0 - 15.0 g/dL 11.8(L) 12.2 10.6(L)  Hematocrit 36.0 - 46.0 % 36.4 37.5 34.6(L)  Platelets 150 - 400 K/uL 205 198 208      CMP     Component Value Date/Time   NA 139 06/27/2020 0900   NA 138  10/25/2012 1330   K 3.9 06/27/2020 0900   K 3.3 (L) 10/25/2012 1330   CL 103 06/27/2020 0900   CL 105 10/25/2012 1330   CO2 27 06/27/2020 0900   CO2 26 10/25/2012 1330   GLUCOSE 121 (H) 06/27/2020 0900   GLUCOSE 100 (H) 10/25/2012 1330   BUN 16 06/27/2020 0900   BUN 13 10/25/2012 1330   CREATININE 0.65 06/27/2020 0900   CREATININE 0.62 10/25/2012 1330   CALCIUM 8.8 (L) 06/27/2020 0900   CALCIUM 8.8 10/25/2012 1330   PROT 7.4 06/27/2020 0900   PROT 7.9 10/25/2012 1330   ALBUMIN 3.6 06/27/2020 0900   ALBUMIN 3.7 10/25/2012 1330   AST 16 06/27/2020 0900   AST 21 10/25/2012 1330   ALT 17 06/27/2020 0900   ALT 29 10/25/2012 1330   ALKPHOS 68 06/27/2020 0900   ALKPHOS 87 10/25/2012 1330   BILITOT 0.9 06/27/2020 0900   BILITOT 0.8 10/25/2012 1330   GFRNONAA >60 06/27/2020 0900   GFRNONAA >60 10/25/2012 1330   GFRAA >60 06/27/2020 0900   GFRAA >60 10/25/2012 1330     No results found.     Assessment & Plan:   1. Pain of left lower extremity The patient's lower extremity symptoms sound somewhat like claudication the patient does have known peripheral arterial disease risk factors.  However the patient also has known lumbar issues with radiculopathy.  We will perform noninvasive studies to rule out any arterial causes of the patient's lower extremity pain.  Patient will follow up at her convenience.  2. Leg swelling Discussed causes of leg swelling.  To the patient's knowledge she does not have any extensive issues with her renal, cardiac or hepatic systems.  The patient has had a previous DVT which can also account for some lower extremity edema.  We had the patient return at her convenience for lower extremity venous reflux study.  Patient also advised to utilize medical grade 1 compression stockings we discussed how to find proper fit of compression stockings.  Stockings to be worn for some morning and taken off for the evening.  Patient also needs compression stockings.  Patient  also elevate her lower extremities much as possible.  Exercise is also helpful if the patient is able to.  3. Essential hypertension  Continue antihypertensive medications as already ordered, these medications have been reviewed and there are no changes at this time.   4. Chronic radicular lumbar pain I suspect this is the cause of the patient's pain and tenderness.  If vascular causes are ruled out patient will continue to follow with PCP and pain clinic for treatment of back pain.   Current Outpatient Medications on File Prior to Visit  Medication Sig Dispense Refill  . acetaminophen (TYLENOL) 325 MG tablet Take 650 mg by mouth 4 (four) times daily.    Marland Kitchen albuterol (VENTOLIN HFA) 108 (90 Base) MCG/ACT inhaler ProAir HFA 108 (90 Base) MCG/ACT Inhalation Aerosol Solution QTY: 1 inhaler Days: 30 Refills: 1  Written: 02/27/18 Patient Instructions: Inhale 1 puff orally every 6 hours as needed for shortness of breath    . citalopram (CELEXA) 10 MG tablet Take 1 tablet (10 mg total) by mouth daily. 30 tablet 1  . ergocalciferol (VITAMIN D2) 1.25 MG (50000 UT) capsule Ergocalciferol 1.25 MG (50000 UT) Oral Capsule QTY: 12 capsule Days: 84 Refills: 3  Written: 03/14/19 Patient Instructions: Take 1 capsule by mouth WEEKLY    . fluticasone (FLONASE) 50 MCG/ACT nasal spray Fluticasone Propionate 50 MCG/ACT Nasal Suspension QTY: 1 bottle Days: 30 Refills: 4  Written: 02/19/18 Patient Instructions: Lean head forward, instill 1 spray in each nostril daily with a gentle sniff    . hydrochlorothiazide (HYDRODIURIL) 25 MG tablet Take 25 mg by mouth daily.  0  . loratadine (CLARITIN) 10 MG tablet TAKE 1 TABLET BY MOUTH ONCE DAILY AS NEEDED FOR SEASONAL ALLERGY SYMPTOMS.    Marland Kitchen losartan (COZAAR) 50 MG tablet Take 50 mg by mouth daily.  1  . mupirocin ointment (BACTROBAN) 2 % Apply 1 application topically 3 (three) times daily. 22 g 0  . omeprazole (PRILOSEC) 40 MG capsule Omeprazole 40 MG Oral Capsule Delayed Release  QTY: 30 capsule Days: 30 Refills: 0  Written: 02/26/19 Patient Instructions: Take 1 capsule by mouth daily in AM 30 min prior to breakfast x 1 month    . pregabalin (LYRICA) 50 MG capsule Take 1 capsule (50 mg total) by mouth 3 (three) times daily. 90 capsule 1  . rivaroxaban (XARELTO) 10 MG TABS tablet Take 1 tablet (10 mg total) by mouth daily. 90 tablet 1  . sucralfate (CARAFATE) 1 g tablet Carafate 1 GM Oral Tablet QTY: 120 tablet Days: 30 Refills: 0  Written: 02/26/19 Patient Instructions: Take 1 tablet by mouth four times daily after meals (dissolve in 3 tablespoons of water)    . tiZANidine (ZANAFLEX) 4 MG capsule Take 4 mg by mouth 3 (three) times daily.     No current facility-administered medications on file prior to visit.    There are no Patient Instructions on file for this visit. No follow-ups on file.   Kris Hartmann, NP

## 2021-01-29 ENCOUNTER — Encounter (INDEPENDENT_AMBULATORY_CARE_PROVIDER_SITE_OTHER): Payer: Self-pay | Admitting: Nurse Practitioner

## 2021-01-29 ENCOUNTER — Ambulatory Visit (INDEPENDENT_AMBULATORY_CARE_PROVIDER_SITE_OTHER): Payer: 59

## 2021-01-29 ENCOUNTER — Other Ambulatory Visit: Payer: Self-pay

## 2021-01-29 ENCOUNTER — Ambulatory Visit (INDEPENDENT_AMBULATORY_CARE_PROVIDER_SITE_OTHER): Payer: 59 | Admitting: Nurse Practitioner

## 2021-01-29 VITALS — BP 120/73 | HR 69 | Resp 16 | Ht 59.0 in | Wt 175.0 lb

## 2021-01-29 DIAGNOSIS — M79605 Pain in left leg: Secondary | ICD-10-CM

## 2021-01-29 DIAGNOSIS — R600 Localized edema: Secondary | ICD-10-CM | POA: Diagnosis not present

## 2021-01-29 DIAGNOSIS — G8929 Other chronic pain: Secondary | ICD-10-CM

## 2021-01-29 DIAGNOSIS — M7989 Other specified soft tissue disorders: Secondary | ICD-10-CM

## 2021-01-29 DIAGNOSIS — M5416 Radiculopathy, lumbar region: Secondary | ICD-10-CM | POA: Diagnosis not present

## 2021-01-29 DIAGNOSIS — I10 Essential (primary) hypertension: Secondary | ICD-10-CM

## 2021-01-30 ENCOUNTER — Encounter (INDEPENDENT_AMBULATORY_CARE_PROVIDER_SITE_OTHER): Payer: Self-pay | Admitting: Nurse Practitioner

## 2021-01-30 NOTE — Progress Notes (Signed)
Subjective:    Patient ID: Alexandra Montes, female    DOB: Sep 07, 1976, 45 y.o.   MRN: [id number] Chief Complaint  Patient presents with  . Follow-up    ultrasound     Alexandra Montes is a 45 year old female that returns today for noninvasive studies.  The patient notes that she has pain in her bilateral lower extremities with swelling in her left leg.  The pain extends from her hips down to her lower calf area.  She notes that the pain is constant and hurts even with palpation.  Muscle relaxers help with her pain.  The patient also has significant lower back pain.  The swelling only happens 2 or 3 days a week.  The patient does live alone.  At her job.  Elevation helps her legs.  She has tried compression stockings but they have been painful.  Her left lower extremity has had DVT previously.  She endorses having significant lower back pain at times.  Today noninvasive studies show no evidence of DVT.  No evidence of deep venous insufficiency or superficial venous reflux.  Today the patient has an ABI of 1.02 on the right and 1.09 on the left.  The patient has strong triphasic tibial artery waveforms bilaterally with strong toe waveforms bilaterally.      Review of Systems     Objective:   Physical Exam  BP 120/73 (BP Location: Right Arm)   Pulse 69   Resp 16   Ht 4\' 11"  (1.499 m)   Wt 175 lb (79.4 kg)   BMI 35.35 kg/m   Past Medical History:  Diagnosis Date  . Anxiety   . Carpal tunnel syndrome   . Cauliflower ear, right   . DVT of deep femoral vein, left ([place])   . GERD (gastroesophageal reflux disease)   . Headache   . Hypertension   . Ovarian cyst    Right    Social History   Socioeconomic History  . Marital status: Divorced    Spouse name: Not on file  . Number of children: 0  . Years of education: Not on file  . Highest education level: GED or equivalent  Occupational History  . Not on file  Tobacco Use  . Smoking status: Never Smoker  . Smokeless tobacco:  Never Used  Vaping Use  . Vaping Use: Never used  Substance and Sexual Activity  . Alcohol use: No  . Drug use: No  . Sexual activity: Yes    Birth control/protection: None  Other Topics Concern  . Not on file  Social History Narrative  . Not on file   Social Determinants of Health   Financial Resource Strain: Not on file  Food Insecurity: Not on file  Transportation Needs: Not on file  Physical Activity: Not on file  Stress: Not on file  Social Connections: Not on file  Intimate Partner Violence: Not on file    Past Surgical History:  Procedure Laterality Date  . AMPUTATION FINGER    . AMPUTATION TOE    . NO PAST SURGERIES      Family History  Problem Relation Age of Onset  . Heart disease Mother   . Bipolar disorder Mother   . Drug abuse Mother   . Alcohol abuse Mother   . Breast cancer Mother 86  . Schizophrenia Father   . Lung cancer Maternal Grandfather     No Known Allergies  CBC Latest Ref Rng & Units 06/27/2020 02/07/2020 10/31/2019  WBC 4.0 -  10.5 K/uL 8.5 4.9 5.4  Hemoglobin 12.0 - 15.0 g/dL 11.8(L) 12.2 10.6(L)  Hematocrit 36.0 - 46.0 % 36.4 37.5 34.6(L)  Platelets 150 - 400 K/uL 205 198 208      CMP     Component Value Date/Time   NA 139 06/27/2020 0900   NA 138 10/25/2012 1330   K 3.9 06/27/2020 0900   K 3.3 (L) 10/25/2012 1330   CL 103 06/27/2020 0900   CL 105 10/25/2012 1330   CO2 27 06/27/2020 0900   CO2 26 10/25/2012 1330   GLUCOSE 121 (H) 06/27/2020 0900   GLUCOSE 100 (H) 10/25/2012 1330   BUN 16 06/27/2020 0900   BUN 13 10/25/2012 1330   CREATININE 0.65 06/27/2020 0900   CREATININE 0.62 10/25/2012 1330   CALCIUM 8.8 (L) 06/27/2020 0900   CALCIUM 8.8 10/25/2012 1330   PROT 7.4 06/27/2020 0900   PROT 7.9 10/25/2012 1330   ALBUMIN 3.6 06/27/2020 0900   ALBUMIN 3.7 10/25/2012 1330   AST 16 06/27/2020 0900   AST 21 10/25/2012 1330   ALT 17 06/27/2020 0900   ALT 29 10/25/2012 1330   ALKPHOS 68 06/27/2020 0900   ALKPHOS 87  10/25/2012 1330   BILITOT 0.9 06/27/2020 0900   BILITOT 0.8 10/25/2012 1330   GFRNONAA >60 06/27/2020 0900   GFRNONAA >60 10/25/2012 1330   GFRAA >60 06/27/2020 0900   GFRAA >60 10/25/2012 1330     No results found.     Assessment & Plan:   1. Leg swelling Patient's lower extremity edema is likely related to post DVT swelling.  Currently no role for intervention at this time.  Patient utilize medical grade 1 compression stockings as outlined above, elevate her lower extremities when possible and try to engage in 20 to 30 minutes of activity 4 days a week.  We will have the patient return to the office on an as-needed basis if she continues to have further issues with lower extremity swelling or development of any vascular issues.  2. Pain of left lower extremity Recommend:  I do not find evidence of Vascular pathology that would explain the patient's symptoms  The patient has atypical pain symptoms for vascular disease  I do not find evidence of Vascular pathology that would explain the patient's symptoms and I suspect the patient is c/o pseudoclaudication.  Patient should have an evaluation of his LS spine which I defer to the primary service.  Noninvasive studies including venous ultrasound of the legs do not identify vascular problems  The patient should continue walking and begin a more formal exercise program. The patient should continue his antiplatelet therapy and aggressive treatment of the lipid abnormalities. The patient should begin wearing graduated compression socks 15-20 mmHg strength to control her mild edema.  Patient will follow-up with me on a PRN basis  Further work-up of her lower extremity pain is deferred to the primary service     3. Essential hypertension Continue antihypertensive medications as already ordered, these medications have been reviewed and there are no changes at this time.   4. Chronic radicular lumbar pain The patient notes that she  has never had any formal evaluation of her lower back.  We will place referral to neurosurgery for evaluation for possible treatment.   Current Outpatient Medications on File Prior to Visit  Medication Sig Dispense Refill  . acetaminophen (TYLENOL) 325 MG tablet Take 650 mg by mouth 4 (four) times daily.    Marland Kitchen albuterol (VENTOLIN HFA) 108 (90 Base)  MCG/ACT inhaler ProAir HFA 108 (90 Base) MCG/ACT Inhalation Aerosol Solution QTY: 1 inhaler Days: 30 Refills: 1  Written: 02/27/18 Patient Instructions: Inhale 1 puff orally every 6 hours as needed for shortness of breath    . citalopram (CELEXA) 10 MG tablet Take 1 tablet (10 mg total) by mouth daily. 30 tablet 1  . ergocalciferol (VITAMIN D2) 1.25 MG (50000 UT) capsule Ergocalciferol 1.25 MG (50000 UT) Oral Capsule QTY: 12 capsule Days: 84 Refills: 3  Written: 03/14/19 Patient Instructions: Take 1 capsule by mouth WEEKLY    . fluticasone (FLONASE) 50 MCG/ACT nasal spray Fluticasone Propionate 50 MCG/ACT Nasal Suspension QTY: 1 bottle Days: 30 Refills: 4  Written: 02/19/18 Patient Instructions: Lean head forward, instill 1 spray in each nostril daily with a gentle sniff    . hydrochlorothiazide (HYDRODIURIL) 25 MG tablet Take 25 mg by mouth daily.  0  . loratadine (CLARITIN) 10 MG tablet TAKE 1 TABLET BY MOUTH ONCE DAILY AS NEEDED FOR SEASONAL ALLERGY SYMPTOMS.    Marland Kitchen losartan (COZAAR) 50 MG tablet Take 50 mg by mouth daily.  1  . mupirocin ointment (BACTROBAN) 2 % Apply 1 application topically 3 (three) times daily. 22 g 0  . omeprazole (PRILOSEC) 40 MG capsule Omeprazole 40 MG Oral Capsule Delayed Release QTY: 30 capsule Days: 30 Refills: 0  Written: 02/26/19 Patient Instructions: Take 1 capsule by mouth daily in AM 30 min prior to breakfast x 1 month    . pregabalin (LYRICA) 50 MG capsule Take 1 capsule (50 mg total) by mouth 3 (three) times daily. 90 capsule 1  . rivaroxaban (XARELTO) 10 MG TABS tablet Take 1 tablet (10 mg total) by mouth daily. 90  tablet 1  . sucralfate (CARAFATE) 1 g tablet Carafate 1 GM Oral Tablet QTY: 120 tablet Days: 30 Refills: 0  Written: 02/26/19 Patient Instructions: Take 1 tablet by mouth four times daily after meals (dissolve in 3 tablespoons of water)    . tiZANidine (ZANAFLEX) 4 MG capsule Take 4 mg by mouth 3 (three) times daily.     No current facility-administered medications on file prior to visit.    There are no Patient Instructions on file for this visit. No follow-ups on file.   Kris Hartmann, NP

## 2021-04-03 ENCOUNTER — Other Ambulatory Visit: Payer: Self-pay

## 2021-04-03 ENCOUNTER — Emergency Department: Payer: 59

## 2021-04-03 ENCOUNTER — Emergency Department
Admission: EM | Admit: 2021-04-03 | Discharge: 2021-04-03 | Disposition: A | Discharge status: Home / Self Care | Payer: 59 | Attending: Emergency Medicine | Admitting: Emergency Medicine

## 2021-04-03 DIAGNOSIS — I10 Essential (primary) hypertension: Secondary | ICD-10-CM | POA: Diagnosis not present

## 2021-04-03 DIAGNOSIS — Z79899 Other long term (current) drug therapy: Secondary | ICD-10-CM | POA: Diagnosis not present

## 2021-04-03 DIAGNOSIS — K1379 Other lesions of oral mucosa: Secondary | ICD-10-CM | POA: Insufficient documentation

## 2021-04-03 DIAGNOSIS — Z7901 Long term (current) use of anticoagulants: Secondary | ICD-10-CM | POA: Insufficient documentation

## 2021-04-03 LAB — COMPREHENSIVE METABOLIC PANEL
ALT: 14 U/L (ref 0–44)
AST: 16 U/L (ref 15–41)
Albumin: 3.7 g/dL (ref 3.5–5.0)
Alkaline Phosphatase: 47 U/L (ref 38–126)
Anion gap: 10 (ref 5–15)
BUN: 16 mg/dL (ref 6–20)
CO2: 25 mmol/L (ref 22–32)
Calcium: 9.1 mg/dL (ref 8.9–10.3)
Chloride: 103 mmol/L (ref 98–111)
Creatinine, Ser: 0.68 mg/dL (ref 0.44–1.00)
GFR, Estimated: >60 mL/min (ref >60)
Glucose, Bld: 85 mg/dL (ref 70–99)
Potassium: 3.8 mmol/L (ref 3.5–5.1)
Sodium: 138 mmol/L (ref 135–145)
Total Bilirubin: 0.8 mg/dL (ref 0.3–1.2)
Total Protein: 7.4 g/dL (ref 6.5–8.1)

## 2021-04-03 LAB — CBC WITH DIFFERENTIAL/PLATELET
Abs Immature Granulocytes: 0.02 10*3/uL (ref 0.00–0.07)
Basophils Absolute: 0 10*3/uL (ref 0.0–0.1)
Basophils Relative: 1 %
Eosinophils Absolute: 0.1 10*3/uL (ref 0.0–0.5)
Eosinophils Relative: 2 %
HCT: 34.8 % — ABNORMAL LOW (ref 36.0–46.0)
Hemoglobin: 11.2 g/dL — ABNORMAL LOW (ref 12.0–15.0)
Immature Granulocytes: 0 %
Lymphocytes Relative: 29 %
Lymphs Abs: 1.9 10*3/uL (ref 0.7–4.0)
MCH: 28.5 pg (ref 26.0–34.0)
MCHC: 32.2 g/dL (ref 30.0–36.0)
MCV: 88.5 fL (ref 80.0–100.0)
Monocytes Absolute: 0.7 10*3/uL (ref 0.1–1.0)
Monocytes Relative: 10 %
Neutro Abs: 3.9 10*3/uL (ref 1.7–7.7)
Neutrophils Relative %: 58 %
Platelets: 185 10*3/uL (ref 150–400)
RBC: 3.93 MIL/uL (ref 3.87–5.11)
RDW: 12.8 % (ref 11.5–15.5)
WBC: 6.7 10*3/uL (ref 4.0–10.5)
nRBC: 0 % (ref 0.0–0.2)

## 2021-04-03 MED ORDER — NAPROXEN 500 MG PO TABS
500.0000 mg | ORAL_TABLET | Freq: Once | ORAL | Status: AC
  Administered 2021-04-03: 500 mg via ORAL
  Filled 2021-04-03: qty 1

## 2021-04-03 MED ORDER — ACETAMINOPHEN 500 MG PO TABS
1000.0000 mg | ORAL_TABLET | Freq: Once | ORAL | Status: AC
  Administered 2021-04-03: 1000 mg via ORAL
  Filled 2021-04-03: qty 2

## 2021-04-03 NOTE — ED Triage Notes (Signed)
Pt states the bottom of her tongue started swelling this morning and was painful- pt states the pain woke her up- pt airway patent and able to speak in complete sentences

## 2021-04-03 NOTE — ED Notes (Signed)
Pt awake, alert and oriented x4.  RR even and unlabored on RA - no stridor noted upon auscultation - airway patent; no obvious tongue or lip edema - airway patent (CT neck results pending) -- S1 and S2 regular upon auscultation.  Abdomen soft, round, nontender.  Skin warm dry and intact.  Pt denies any additional needs, questions, concerns, at this time

## 2021-04-03 NOTE — Discharge Instructions (Signed)
Use naproxen/Aleve for anti-inflammatory pain relief. Use up to 500mg  every 12 hours. Do not take more frequently than this. Do not use other NSAIDs (ibuprofen, Advil) while taking this medication. It is safe to take Tylenol with this.  Use Tylenol for pain and fevers.  Up to 1000 mg per dose, up to 4 times per day.  Do not take more than 4000 mg of Tylenol/acetaminophen within 24 hours..  Use hard candies to help stimulate saliva glands to help push out any potential small stones contributing to your pain.  If you develop any further worsening pain despite the above medications, swelling of your throat, lips or difficulty breathing, please return to the ED

## 2021-04-03 NOTE — ED Provider Notes (Signed)
[facility] Emergency Department Provider Note ____________________________________________   Event Date/Time   First MD Initiated Contact with Patient 04/03/21 1855     (approximate)  I have reviewed the triage vital signs and the nursing notes.  HISTORY  Chief Complaint Facial Swelling   HPI Alexandra Montes is a 45 y.o. femalewho presents to the ED for evaluation of sublingual pain.   Chart review indicates history DVT on Xarelto, HTN on losartan but no ACE inhibitor, obesity.  Patient reports pain and tenderness to the floor of her mouth just beneath her tongue bilaterally, right greater than left, for the past few days.  Reports she is tender there and prefers to chew with her lateral teeth as opposed to her frontal incisors due to this.  Denies loose teeth, trauma or neck pain.  Denies fevers, sore throat, difficulty phonating or swallowing.  She does report some dry mouth, and then sensation of increased sputum within her mouth.   While she is initially triaged as facial swelling, she primarily denies swelling and is reporting pain.  Denies tongue, facial or lip swelling sensation.  Denies fevers or injuries.  Past Medical History:  Diagnosis Date  . Anxiety   . Carpal tunnel syndrome   . Cauliflower ear, right   . DVT of deep femoral vein, left ([place])   . GERD (gastroesophageal reflux disease)   . Headache   . Hypertension   . Ovarian cyst    Right    Patient Active Problem List   Diagnosis Date Noted  . 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 Lumbar spondylosis 09/29/2020  . Chronic pain syndrome 09/29/2020  . Spondylosis 07/03/2020  . Other B-complex deficiencies 11/01/2019  . Acute deep vein thrombosis (DVT) of femoral vein of left lower extremity ([place]) 06/29/2019  . Pain of left lower extremity 06/29/2019  . Pain in soft tissues of limb 06/29/2019  . Post-traumatic stress disorder 11/20/2018  . Allergic rhinitis 08/14/2018  .  Vitamin D deficiency 04/09/2018  . Depression 11/30/2017  . Essential hypertension 09/20/2017  . Uterine leiomyoma 09/11/2017  . Slow transit constipation 09/11/2017  . Cyst of right ovary 09/11/2017  . Pelvic pain in female 09/11/2017  . Carpal tunnel syndrome 09/04/2017  . Cauliflower ear 09/04/2017  . Headache syndrome 01/03/2014  . Family history of malignant neoplasm 10/03/2012    Past Surgical History:  Procedure Laterality Date  . AMPUTATION FINGER    . AMPUTATION TOE    . NO PAST SURGERIES      Prior to Admission medications   Medication Sig Start Date End Date Taking? Authorizing Provider  acetaminophen (TYLENOL) 325 MG tablet Take 650 mg by mouth 4 (four) times daily.    [provider]  albuterol (VENTOLIN HFA) 108 (90 Base) MCG/ACT inhaler ProAir HFA 108 (90 Base) MCG/ACT Inhalation Aerosol Solution QTY: 1 inhaler Days: 30 Refills: 1  Written: 02/27/18 Patient Instructions: Inhale 1 puff orally every 6 hours as needed for shortness of breath 02/27/18   [provider]  citalopram (CELEXA) 10 MG tablet Take 1 tablet (10 mg total) by mouth daily. 02/12/18   Ursula Alert, MD  ergocalciferol (VITAMIN D2) 1.25 MG (50000 UT) capsule Ergocalciferol 1.25 MG (50000 UT) Oral Capsule QTY: 12 capsule Days: 84 Refills: 3  Written: 03/14/19 Patient Instructions: Take 1 capsule by mouth WEEKLY 03/14/19   [provider]  fluticasone (FLONASE) 50 MCG/ACT nasal spray Fluticasone Propionate 50 MCG/ACT Nasal Suspension QTY: 1 bottle Days: 30 Refills:  4  Written: 02/19/18 Patient Instructions: Lean head forward, instill 1 spray in each nostril daily with a gentle sniff 02/19/18   [provider]  hydrochlorothiazide (HYDRODIURIL) 25 MG tablet Take 25 mg by mouth daily. 12/14/17   [provider]  loratadine (CLARITIN) 10 MG tablet TAKE 1 TABLET BY MOUTH ONCE DAILY AS NEEDED FOR SEASONAL ALLERGY SYMPTOMS. 01/11/19   [provider]  losartan  (COZAAR) 50 MG tablet Take 50 mg by mouth daily. 01/06/18   [provider]  mupirocin ointment (BACTROBAN) 2 % Apply 1 application topically 3 (three) times daily. 06/05/17   Frederich Cha, MD  omeprazole (PRILOSEC) 40 MG capsule Omeprazole 40 MG Oral Capsule Delayed Release QTY: 30 capsule Days: 30 Refills: 0  Written: 02/26/19 Patient Instructions: Take 1 capsule by mouth daily in AM 30 min prior to breakfast x 1 month 02/26/19   [provider]  pregabalin (LYRICA) 50 MG capsule Take 1 capsule (50 mg total) by mouth 3 (three) times daily. 11/09/20   Gillis Santa, MD  rivaroxaban (XARELTO) 10 MG TABS tablet Take 1 tablet (10 mg total) by mouth daily. 02/07/20   Earlie Server, MD  sucralfate (CARAFATE) 1 g tablet Carafate 1 GM Oral Tablet QTY: 120 tablet Days: 30 Refills: 0  Written: 02/26/19 Patient Instructions: Take 1 tablet by mouth four times daily after meals (dissolve in 3 tablespoons of water) 02/26/19   [provider]  tiZANidine (ZANAFLEX) 4 MG capsule Take 4 mg by mouth 3 (three) times daily.    [provider]    Allergies Patient has no known allergies.  Family History  Problem Relation Age of Onset  . Heart disease Mother   . Bipolar disorder Mother   . Drug abuse Mother   . Alcohol abuse Mother   . Breast cancer Mother 93  . Schizophrenia Father   . Lung cancer Maternal Grandfather     Social History Social History   Tobacco Use  . Smoking status: Never Smoker  . Smokeless tobacco: Never Used  Vaping Use  . Vaping Use: Never used  Substance Use Topics  . Alcohol use: No  . Drug use: No    Review of Systems  Constitutional: No fever/chills Eyes: No visual changes. ENT: No sore throat.  Positive for tenderness and pain to the floor of her mouth beneath her tongue Cardiovascular: Denies chest pain. Respiratory: Denies shortness of breath. Gastrointestinal: No abdominal pain.  No nausea, no vomiting.  No diarrhea.  No  constipation. Genitourinary: Negative for dysuria. Musculoskeletal: Negative for back pain. Skin: Negative for rash. Neurological: Negative for headaches, focal weakness or numbness.  ____________________________________________   PHYSICAL EXAM:  VITAL SIGNS: Vitals:   04/03/21 1840  BP: 119/76  Pulse: 69  Resp: 18  Temp: 98 F (36.7 C)  SpO2: 100%     Constitutional: Alert and oriented. Well appearing and in no acute distress. Eyes: Conjunctivae are normal. PERRL. EOMI. Head: Atraumatic. Nose: No congestion/rhinnorhea. Mouth/Throat: Mucous membranes are moist.  Oropharynx non-erythematous. Uvula is midline.  Tonsils 1+ bilaterally without erythema or exudate. No loose teeth or signs of periapical abscess.  Her tongue is tethered/ankyloglossia is a present.  Able to lift her tongue and I see no visual signs of swelling, erythema or pathology beneath her tongue.  Utilizing tongue depressor to help with examination, there is some mild tenderness bilaterally beneath her tongue to the floor of her mouth. Neck: No stridor. No cervical spine tenderness to palpation. No meningismus.  Full active and passive ROM of the neck, that does not elicit any pain.  Floor of the mouth/midline submandibular area, externally, is soft and nontender. Cardiovascular: Normal rate, regular rhythm. Grossly normal heart sounds.  Good peripheral circulation. Respiratory: Normal respiratory effort.  No retractions. Lungs CTAB. Gastrointestinal: Soft , nondistended, nontender to palpation. No CVA tenderness. Musculoskeletal: No lower extremity tenderness nor edema.  No joint effusions. No signs of acute trauma. Neurologic:  Normal speech and language. No gross focal neurologic deficits are appreciated. No gait instability noted. Skin:  Skin is warm, dry and intact. No rash noted. Psychiatric: Mood and affect are normal. Speech and behavior are normal.  ____________________________________________    LABS (all labs ordered are listed, but only abnormal results are displayed)  Labs Reviewed  CBC WITH DIFFERENTIAL/PLATELET - Abnormal; Notable for the following components:      Result Value   Hemoglobin 11.2 (*)    HCT 34.8 (*)    All other components within normal limits  COMPREHENSIVE METABOLIC PANEL    ____________________________________________  RADIOLOGY  ED MD interpretation: CT neck without contrast reviewed by me without evidence of inflammation or Ludwick's angina, no evidence of sialoadenitis or lithiasis  Official radiology report(s): CT Soft Tissue Neck Wo Contrast  Result Date: 04/03/2021 CLINICAL DATA:  Initial evaluation for possible right submandibular sialolithiasis. EXAM: CT NECK WITHOUT CONTRAST TECHNIQUE: Multidetector CT imaging of the neck was performed following the standard protocol without intravenous contrast. COMPARISON:  None. FINDINGS: Pharynx and larynx: Oral cavity within normal limits. No acute abnormality about the dentition. Oropharynx and nasopharynx within normal limits. No retropharyngeal collection. Epiglottis normal. Hypopharynx and supraglottic larynx normal. Glottis within normal limits. Subglottic airway patent clear. Salivary glands: Salivary glands including the parotid and submandibular glands are normal. No evidence for solid phthisis or acute sialoadenitis. Thyroid: Thyroid somewhat hypoplastic but otherwise unremarkable. Lymph nodes: No enlarged or pathologic adenopathy seen on this noncontrast examination. Vascular: Evaluation of vascular structures limited by lack of IV contrast. Limited intracranial: Unremarkable. Visualized orbits: Unremarkable. Mastoids and visualized paranasal sinuses: Visualized paranasal sinuses are clear. Visualized mastoids and middle ear cavities are well pneumatized and free of fluid. Skeleton: No acute osseous finding. No discrete or worrisome osseous lesions. Upper chest: Visualized upper chest demonstrates no  acute finding. Partially visualized lungs are clear. Other: None. IMPRESSION: Normal CT of the neck. No evidence for acute sialoadenitis or sialolithiasis. Electronically Signed   By: Jeannine Boga M.D.   On: 04/03/2021 19:48    ____________________________________________   PROCEDURES and INTERVENTIONS  Procedure(s) performed (including Critical Care):  .1-3 Lead EKG Interpretation Performed by: Vladimir Crofts, MD Authorized by: Vladimir Crofts, MD     Interpretation: normal     ECG rate:  66   ECG rate assessment: normal     Rhythm: sinus rhythm     Ectopy: none     Conduction: normal      Medications  naproxen (NAPROSYN) tablet 500 mg (500 mg Oral Given 04/03/21 2014)  acetaminophen (TYLENOL) tablet 1,000 mg (1,000 mg Oral Given 04/03/21 2014)    ____________________________________________   MDM / ED COURSE   45 year old woman on ARB presents to the ED with sublingual pain without significant swelling, possibly from sialolithiasis, and amenable to outpatient management.  Normal vitals.  Exam is quite reassuring without evidence of upper airway obstruction, angioedema, periapical abscess or other dental pathology, or posterior oropharyngeal pathology such as PTA.  She has some minimal tenderness to the floor of her mouth beneath  her tongue.  No signs of Ludwick's angina.  Work-up is benign without leukocytosis or signs of infection.  CT neck without evidence of underlying pathology.  Her symptoms sound most consistent with sialolithiasis and we discussed empiric treatment for this.  We discussed return precautions for the ED and she is stable for outpatient management.   Clinical Course as of 04/03/21 2253  Sat Apr 03, 2021  2049 Reassessed.  Patient reports feeling better.  We discussed reassuring work-up without evidence of infection.  We discussed CT without evidence of acute derangements.  We discussed empiric treatment for sialolithiasis.  We discussed  anti-inflammatories and hard candies.  We discussed return precautions for the ED if she develops any swelling, changes to her phonation, sore throat or other worsening symptoms despite these measures. [DS]    Clinical Course User Index [DS] Vladimir Crofts, MD    ____________________________________________   FINAL CLINICAL IMPRESSION(S) / ED DIAGNOSES  Final diagnoses:  Mouth pain     ED Discharge Orders    None       Alexandra Montes   Note:  This document was prepared using Dragon voice recognition software and may include unintentional dictation errors.   Vladimir Crofts, MD 04/03/21 [phone]

## 2021-04-03 NOTE — ED Notes (Signed)
Pt agreeable with plan for d/c as discussed by provider- this nurse has verbally reinforced d/c instructions and provided pt with written copy - pt acknowledges verbal understanding and denies any additional questions concerns needs- ambulatory at discharge independently with steady gait- no acute distress.

## 2021-04-03 NOTE — ED Notes (Signed)
Patient transported from CT 

## 2021-04-05 ENCOUNTER — Encounter: Payer: Self-pay | Admitting: Emergency Medicine

## 2021-04-05 ENCOUNTER — Other Ambulatory Visit: Payer: Self-pay

## 2021-04-05 ENCOUNTER — Emergency Department
Admission: EM | Admit: 2021-04-05 | Discharge: 2021-04-05 | Disposition: A | Discharge status: Home / Self Care | Payer: 59 | Attending: Emergency Medicine | Admitting: Emergency Medicine

## 2021-04-05 DIAGNOSIS — I10 Essential (primary) hypertension: Secondary | ICD-10-CM | POA: Diagnosis not present

## 2021-04-05 DIAGNOSIS — R6884 Jaw pain: Secondary | ICD-10-CM | POA: Insufficient documentation

## 2021-04-05 DIAGNOSIS — Z79899 Other long term (current) drug therapy: Secondary | ICD-10-CM | POA: Diagnosis not present

## 2021-04-05 DIAGNOSIS — K1379 Other lesions of oral mucosa: Secondary | ICD-10-CM

## 2021-04-05 DIAGNOSIS — K146 Glossodynia: Secondary | ICD-10-CM | POA: Insufficient documentation

## 2021-04-05 DIAGNOSIS — R220 Localized swelling, mass and lump, head: Secondary | ICD-10-CM | POA: Diagnosis present

## 2021-04-05 DIAGNOSIS — Z7901 Long term (current) use of anticoagulants: Secondary | ICD-10-CM | POA: Diagnosis not present

## 2021-04-05 NOTE — ED Provider Notes (Signed)
[facility] Emergency Department Provider Note ____________________________________________   Event Date/Time   First MD Initiated Contact with Patient 04/05/21 1517     (approximate)  I have reviewed the triage vital signs and the nursing notes.  HISTORY  Chief Complaint Oral Swelling   HPI Alexandra Montes is a 45 y.o. femalewho presents to the ED for evaluation of oral swelling.   Chart review indicates I saw this patient 2 days ago for oral pain, with a benign workup and diagnosed with possible sialolithiasis.   Patient returned to the ED today due to her tongue stinging and hurting.  She continues to deny any lip, oropharyngeal swelling, facial swelling.  She is reporting pain.   She reports improvement of her sublingual pain with Tylenol and NSAIDs, but the pain returns after few hours when it is time to re-dose.  Now reports 24 hours of tongue sharp stinging pain, similarly improved with Tylenol and NSAIDs, but recurs after a few hours.  Denies fevers, choking episodes, changes to her phonation or swallowing, ear pain or aches, congestion or drainage, vision changes, falls or trauma.  Past Medical History:  Diagnosis Date  . Anxiety   . Carpal tunnel syndrome   . Cauliflower ear, right   . DVT of deep femoral vein, left ([place])   . GERD (gastroesophageal reflux disease)   . Headache   . Hypertension   . Ovarian cyst    Right    Patient Active Problem List   Diagnosis Date Noted  . 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 Lumbar spondylosis 09/29/2020  . Chronic pain syndrome 09/29/2020  . Spondylosis 07/03/2020  . Other B-complex deficiencies 11/01/2019  . Acute deep vein thrombosis (DVT) of femoral vein of left lower extremity ([place]) 06/29/2019  . Pain of left lower extremity 06/29/2019  . Pain in soft tissues of limb 06/29/2019  . Post-traumatic stress disorder 11/20/2018  . Allergic rhinitis 08/14/2018  . Vitamin D deficiency  04/09/2018  . Depression 11/30/2017  . Essential hypertension 09/20/2017  . Uterine leiomyoma 09/11/2017  . Slow transit constipation 09/11/2017  . Cyst of right ovary 09/11/2017  . Pelvic pain in female 09/11/2017  . Carpal tunnel syndrome 09/04/2017  . Cauliflower ear 09/04/2017  . Headache syndrome 01/03/2014  . Family history of malignant neoplasm 10/03/2012    Past Surgical History:  Procedure Laterality Date  . AMPUTATION FINGER    . AMPUTATION TOE    . NO PAST SURGERIES      Prior to Admission medications   Medication Sig Start Date End Date Taking? Authorizing Provider  acetaminophen (TYLENOL) 325 MG tablet Take 650 mg by mouth 4 (four) times daily.    [provider]  albuterol (VENTOLIN HFA) 108 (90 Base) MCG/ACT inhaler ProAir HFA 108 (90 Base) MCG/ACT Inhalation Aerosol Solution QTY: 1 inhaler Days: 30 Refills: 1  Written: 02/27/18 Patient Instructions: Inhale 1 puff orally every 6 hours as needed for shortness of breath 02/27/18   [provider]  citalopram (CELEXA) 10 MG tablet Take 1 tablet (10 mg total) by mouth daily. 02/12/18   Ursula Alert, MD  ergocalciferol (VITAMIN D2) 1.25 MG (50000 UT) capsule Ergocalciferol 1.25 MG (50000 UT) Oral Capsule QTY: 12 capsule Days: 84 Refills: 3  Written: 03/14/19 Patient Instructions: Take 1 capsule by mouth WEEKLY 03/14/19   [provider]  fluticasone (FLONASE) 50 MCG/ACT nasal spray Fluticasone Propionate 50 MCG/ACT Nasal Suspension QTY: 1 bottle Days: 30 Refills: 4  Written: 02/19/18  Patient Instructions: Lean head forward, instill 1 spray in each nostril daily with a gentle sniff 02/19/18   [provider]  hydrochlorothiazide (HYDRODIURIL) 25 MG tablet Take 25 mg by mouth daily. 12/14/17   [provider]  loratadine (CLARITIN) 10 MG tablet TAKE 1 TABLET BY MOUTH ONCE DAILY AS NEEDED FOR SEASONAL ALLERGY SYMPTOMS. 01/11/19   [provider]  losartan (COZAAR) 50 MG tablet Take  50 mg by mouth daily. 01/06/18   [provider]  mupirocin ointment (BACTROBAN) 2 % Apply 1 application topically 3 (three) times daily. 06/05/17   Frederich Cha, MD  omeprazole (PRILOSEC) 40 MG capsule Omeprazole 40 MG Oral Capsule Delayed Release QTY: 30 capsule Days: 30 Refills: 0  Written: 02/26/19 Patient Instructions: Take 1 capsule by mouth daily in AM 30 min prior to breakfast x 1 month 02/26/19   [provider]  pregabalin (LYRICA) 50 MG capsule Take 1 capsule (50 mg total) by mouth 3 (three) times daily. 11/09/20   Gillis Santa, MD  rivaroxaban (XARELTO) 10 MG TABS tablet Take 1 tablet (10 mg total) by mouth daily. 02/07/20   Earlie Server, MD  sucralfate (CARAFATE) 1 g tablet Carafate 1 GM Oral Tablet QTY: 120 tablet Days: 30 Refills: 0  Written: 02/26/19 Patient Instructions: Take 1 tablet by mouth four times daily after meals (dissolve in 3 tablespoons of water) 02/26/19   [provider]  tiZANidine (ZANAFLEX) 4 MG capsule Take 4 mg by mouth 3 (three) times daily.    [provider]    Allergies Patient has no known allergies.  Family History  Problem Relation Age of Onset  . Heart disease Mother   . Bipolar disorder Mother   . Drug abuse Mother   . Alcohol abuse Mother   . Breast cancer Mother 29  . Schizophrenia Father   . Lung cancer Maternal Grandfather     Social History Social History   Tobacco Use  . Smoking status: Never Smoker  . Smokeless tobacco: Never Used  Vaping Use  . Vaping Use: Never used  Substance Use Topics  . Alcohol use: No  . Drug use: No    Review of Systems  Constitutional: No fever/chills Eyes: No visual changes. ENT: No sore throat.  Positive for sublingual and lingual pain Cardiovascular: Denies chest pain. Respiratory: Denies shortness of breath. Gastrointestinal: No abdominal pain.  No nausea, no vomiting.  No diarrhea.  No constipation. Genitourinary: Negative for dysuria. Musculoskeletal: Negative for  back pain. Skin: Negative for rash. Neurological: Negative for headaches, focal weakness or numbness.  ____________________________________________   PHYSICAL EXAM:  VITAL SIGNS: Vitals:   04/05/21 1519  BP: 111/70  Pulse: 87  Resp: 18  Temp: 97.8 F (36.6 C)  SpO2: 99%     Constitutional: Alert and oriented. Well appearing and in no acute distress.  Conversational in full sentences Eyes: Conjunctivae are normal. PERRL. EOMI. Head: Atraumatic. Nose: No congestion/rhinnorhea. Mouth/Throat: Mucous membranes are moist.  Oropharynx non-erythematous. Ankyloglossia persists.  No tenderness when I palpate her tongue and no palpable masses, lacerations or signs of trauma. No apparent swelling to her mouth, lips, tongue or sublingual area. Benign external exam to her mentum and submandibular space is soft and nontender.  Full range of motion to the neck Neck: No stridor. No cervical spine tenderness to palpation. Cardiovascular: Normal rate, regular rhythm. Grossly normal heart sounds.  Good peripheral circulation. Respiratory: Normal respiratory effort.  No retractions. Lungs CTAB. Gastrointestinal: Soft , nondistended, nontender to palpation.  No CVA tenderness. Musculoskeletal: No lower extremity tenderness nor edema.  No joint effusions. No signs of acute trauma. Neurologic:  Normal speech and language. No gross focal neurologic deficits are appreciated. No gait instability noted. Skin:  Skin is warm, dry and intact. No rash noted. Psychiatric: Mood and affect are normal. Speech and behavior are normal.  ____________________________________________   LABS (all labs ordered are listed, but only abnormal results are displayed)  Labs Reviewed - No data to display ____________________________________________  12 Lead EKG  ____________________________________________   PROCEDURES and INTERVENTIONS  Procedure(s) performed (including Critical Care):  Procedures  Medications  - No data to display  ____________________________________________   MDM / ED COURSE   45 year old woman on ARB presents to the ED with lingual and sublingual pain of uncertain etiology, possibly very mild angioedema, and amenable to outpatient management with ENT follow-up.  Normal vitals on room air.  She looks comfortable without distress, changes to her phonation, drooling, peritonsillar abscess, Ludwig's angina or more severe pathologies.  I cannot appreciate any swelling intraorally but considering her persistence of symptoms and slight progression, we discussed the possibility of angioedema.  We discussed discontinuation of her ARB antihypertensives and following up closely with ENT as an outpatient.  Return precautions for the ED were discussed.      ____________________________________________   FINAL CLINICAL IMPRESSION(S) / ED DIAGNOSES  Final diagnoses:  Tongue pain  Mouth pain     ED Discharge Orders    None       Iyan Flett   Note:  This document was prepared using Dragon voice recognition software and may include unintentional dictation errors.   Vladimir Crofts, MD 04/05/21 [phone]

## 2021-04-05 NOTE — Discharge Instructions (Addendum)
As we discussed, stop taking your losartan/Cozaar blood pressure medicine.  It is possible that the pain and swelling sensation that you are feeling is a side effect of this medication.  Use Tylenol for pain and fevers.  Up to 1000 mg per dose, up to 4 times per day.  Do not take more than 4000 mg of Tylenol/acetaminophen within 24 hours.. Use naproxen/Aleve for anti-inflammatory pain relief. Use up to 500mg  every 12 hours. Do not take more frequently than this. Do not use other NSAIDs (ibuprofen, Advil) while taking this medication. It is safe to take Tylenol with this.   Return to the ED with any inability to breathe, otherwise follow-up with the ENT doctor in the clinic.  I have attached the phone number to call today to schedule his appointment

## 2021-04-05 NOTE — ED Notes (Signed)
See triage note  Presents with swelling to tongue  States she was seen on Saturday  States swelling was under her tongue  Today states pain is mainly to top of tongue

## 2021-04-30 ENCOUNTER — Other Ambulatory Visit: Payer: Self-pay | Admitting: Nurse Practitioner

## 2021-04-30 DIAGNOSIS — Z1231 Encounter for screening mammogram for malignant neoplasm of breast: Secondary | ICD-10-CM

## 2021-05-13 ENCOUNTER — Other Ambulatory Visit: Payer: Self-pay

## 2021-05-13 ENCOUNTER — Ambulatory Visit
Admission: RE | Admit: 2021-05-13 | Discharge: 2021-05-13 | Disposition: A | Discharge status: Home / Self Care | Payer: 59 | Source: Ambulatory Visit | Attending: Nurse Practitioner | Admitting: Nurse Practitioner

## 2021-05-13 DIAGNOSIS — Z1231 Encounter for screening mammogram for malignant neoplasm of breast: Secondary | ICD-10-CM | POA: Diagnosis not present

## 2021-06-26 ENCOUNTER — Emergency Department
Admission: EM | Admit: 2021-06-26 | Discharge: 2021-06-26 | Disposition: A | Discharge status: Home / Self Care | Payer: 59 | Attending: Emergency Medicine | Admitting: Emergency Medicine

## 2021-06-26 ENCOUNTER — Other Ambulatory Visit: Payer: Self-pay

## 2021-06-26 DIAGNOSIS — Z8742 Personal history of other diseases of the female genital tract: Secondary | ICD-10-CM | POA: Insufficient documentation

## 2021-06-26 DIAGNOSIS — G8929 Other chronic pain: Secondary | ICD-10-CM | POA: Insufficient documentation

## 2021-06-26 DIAGNOSIS — M549 Dorsalgia, unspecified: Secondary | ICD-10-CM | POA: Diagnosis present

## 2021-06-26 DIAGNOSIS — Z7901 Long term (current) use of anticoagulants: Secondary | ICD-10-CM | POA: Insufficient documentation

## 2021-06-26 DIAGNOSIS — Z86718 Personal history of other venous thrombosis and embolism: Secondary | ICD-10-CM | POA: Insufficient documentation

## 2021-06-26 DIAGNOSIS — M545 Low back pain, unspecified: Secondary | ICD-10-CM | POA: Diagnosis not present

## 2021-06-26 DIAGNOSIS — I10 Essential (primary) hypertension: Secondary | ICD-10-CM | POA: Insufficient documentation

## 2021-06-26 DIAGNOSIS — Z79899 Other long term (current) drug therapy: Secondary | ICD-10-CM | POA: Insufficient documentation

## 2021-06-26 DIAGNOSIS — Z5321 Procedure and treatment not carried out due to patient leaving prior to being seen by health care provider: Secondary | ICD-10-CM | POA: Insufficient documentation

## 2021-06-26 NOTE — ED Triage Notes (Addendum)
C/o chronic back pain. Pt. States she has been diagnosed with L1, L3, L5 issues. Pt. States her insurance will not cover her needed back injections. Hydrocodone 10/325 she has been prescribed is not working. Pt. States pain has not changed, is same.

## 2021-06-27 ENCOUNTER — Emergency Department
Admission: EM | Admit: 2021-06-27 | Discharge: 2021-06-27 | Disposition: A | Discharge status: Home / Self Care | Payer: 59 | Source: Home / Self Care | Attending: Emergency Medicine | Admitting: Emergency Medicine

## 2021-06-27 DIAGNOSIS — G8929 Other chronic pain: Secondary | ICD-10-CM

## 2021-06-27 MED ORDER — METHYLPREDNISOLONE 4 MG PO TBPK: ORAL_TABLET | ORAL | 0 refills | Status: DC

## 2021-06-27 MED ORDER — KETOROLAC TROMETHAMINE 30 MG/ML IJ SOLN
30.0000 mg | Freq: Once | INTRAMUSCULAR | Status: AC
  Administered 2021-06-27: 30 mg via INTRAMUSCULAR
  Filled 2021-06-27: qty 1

## 2021-06-27 NOTE — ED Notes (Signed)
See triage note  Presents with back pain  States she has chronic back pain  Pain radiates from her neck into lower back and both legs  States she goes to spine clinic in [place]   States she has been able to take her pain meds with some relief  But this time it did not work

## 2021-06-27 NOTE — ED Provider Notes (Signed)
[facility] Provider Note  ____________________________________________   Event Date/Time   First MD Initiated Contact with Patient 06/27/21 [phone]     (approximate)  I have reviewed the triage vital signs and the nursing notes.   HISTORY  Chief Complaint Back Pain (C/o chronic back pain, diagnosed issue with L1,L3, L5. Pt. States her doctors say she needs a shot, but insurance wont pay for it. Pt. Taking hydrocodone 10/325 at home with no alleviation of symptoms. Pt. States she has a pain doctor. Pt. States pain is the same as her chronic pain.)    HPI [facility] is a 45 y.o. female  C/o chronic low back pain which increased for 2 day, no known injury, pain is worse with movement, increased with bending over, denies numbness, tingling, or changes in bowel/urinary habits,  Using otc meds without relief Remainder ros neg.  Patient is taking her hydrocodone without relief.  States she Hydrocodone and muscle relaxer all at once as it makes her too drowsy.   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   Lumbar spondylosis 09/29/2020   Chronic pain syndrome 09/29/2020   Spondylosis 07/03/2020   Other B-complex deficiencies 11/01/2019   Acute deep vein thrombosis (DVT) of femoral vein of left lower extremity ([place]) 06/29/2019   Pain of left lower extremity 06/29/2019   Pain in soft tissues of limb 06/29/2019   Post-traumatic stress disorder 11/20/2018   Allergic rhinitis 08/14/2018   Vitamin D deficiency 04/09/2018   Depression 11/30/2017   Essential hypertension 09/20/2017   Uterine leiomyoma 09/11/2017   Slow transit constipation  09/11/2017   Cyst of right ovary 09/11/2017   Pelvic pain in female 09/11/2017   Carpal tunnel syndrome 09/04/2017   Cauliflower ear 09/04/2017   Headache syndrome 01/03/2014   Family history of malignant neoplasm 10/03/2012    Past Surgical History:  Procedure Laterality Date   AMPUTATION FINGER     AMPUTATION TOE     NO PAST SURGERIES      Prior to Admission medications   Medication Sig Start Date End Date Taking? Authorizing Provider  methylPREDNISolone (MEDROL DOSEPAK) 4 MG TBPK tablet Take 6 pills on day one then decrease by 1 pill each day 06/27/21  Yes Jordy Verba, Linden Dolin, PA-C  acetaminophen (TYLENOL) 325 MG tablet Take 650 mg by mouth 4 (four) times daily.    [provider]  albuterol (VENTOLIN HFA) 108 (90 Base) MCG/ACT inhaler ProAir HFA 108 (90 Base) MCG/ACT Inhalation Aerosol Solution QTY: 1 inhaler Days: 30 Refills: 1  Written: 02/27/18 Patient Instructions: Inhale 1 puff orally every 6 hours as needed for shortness of breath 02/27/18   [provider]  citalopram (CELEXA) 10 MG tablet Take 1 tablet (10 mg total) by mouth daily. 02/12/18   Ursula Alert, MD  ergocalciferol (VITAMIN D2) 1.25 MG (50000 UT) capsule Ergocalciferol 1.25 MG (50000 UT) Oral Capsule QTY: 12 capsule Days: 84 Refills: 3  Written: 03/14/19 Patient Instructions: Take 1 capsule by mouth WEEKLY 03/14/19   [provider]  fluticasone (FLONASE) 50 MCG/ACT nasal spray Fluticasone Propionate 50  MCG/ACT Nasal Suspension QTY: 1 bottle Days: 30 Refills: 4  Written: 02/19/18 Patient Instructions: Lean head forward, instill 1 spray in each nostril daily with a gentle sniff 02/19/18   [provider]  hydrochlorothiazide (HYDRODIURIL) 25 MG tablet Take 25 mg by mouth daily. 12/14/17   [provider]  loratadine (CLARITIN) 10 MG tablet TAKE 1 TABLET BY MOUTH ONCE DAILY AS NEEDED FOR SEASONAL ALLERGY SYMPTOMS. 01/11/19   [provider]  losartan (COZAAR) 50 MG tablet Take  50 mg by mouth daily. 01/06/18   [provider]  mupirocin ointment (BACTROBAN) 2 % Apply 1 application topically 3 (three) times daily. 06/05/17   Frederich Cha, MD  omeprazole (PRILOSEC) 40 MG capsule Omeprazole 40 MG Oral Capsule Delayed Release QTY: 30 capsule Days: 30 Refills: 0  Written: 02/26/19 Patient Instructions: Take 1 capsule by mouth daily in AM 30 min prior to breakfast x 1 month 02/26/19   [provider]  pregabalin (LYRICA) 50 MG capsule Take 1 capsule (50 mg total) by mouth 3 (three) times daily. 11/09/20   Gillis Santa, MD  rivaroxaban (XARELTO) 10 MG TABS tablet Take 1 tablet (10 mg total) by mouth daily. 02/07/20   Earlie Server, MD  sucralfate (CARAFATE) 1 g tablet Carafate 1 GM Oral Tablet QTY: 120 tablet Days: 30 Refills: 0  Written: 02/26/19 Patient Instructions: Take 1 tablet by mouth four times daily after meals (dissolve in 3 tablespoons of water) 02/26/19   [provider]  tiZANidine (ZANAFLEX) 4 MG capsule Take 4 mg by mouth 3 (three) times daily.    [provider]    Allergies Patient has no known allergies.  Family History  Problem Relation Age of Onset   Heart disease Mother    Bipolar disorder Mother    Drug abuse Mother    Alcohol abuse Mother    Breast cancer Mother 60   Schizophrenia Father    Lung cancer Maternal Grandfather     Social History Social History   Tobacco Use   Smoking status: Never   Smokeless tobacco: Never  Vaping Use   Vaping Use: Never used  Substance Use Topics   Alcohol use: No   Drug use: No    Review of Systems  Constitutional: No fever/chills Eyes: No visual changes. ENT: No sore throat. Respiratory: Denies cough Genitourinary: Negative for dysuria. Musculoskeletal: Positive for back pain. Skin: Negative for rash.    ____________________________________________   PHYSICAL EXAM:  VITAL SIGNS: ED Triage Vitals  Enc Vitals Group     BP 06/26/21 2352 108/74     Pulse Rate  06/26/21 2352 68     Resp 06/26/21 2352 18     Temp --      Temp Source 06/26/21 2352 Oral     SpO2 06/26/21 2352 97 %     Weight 06/26/21 2353 167 lb (75.8 kg)     Height 06/26/21 2353 '4\' 11"'$  (1.499 m)     Head Circumference --      Peak Flow --      Pain Score 06/26/21 2353 10     Pain Loc --      Pain Edu? --      Excl. in [place]? --     Constitutional: Alert and oriented. Well appearing and in no acute distress. Eyes: Conjunctivae are normal.  Head: Atraumatic. Nose: No congestion/rhinnorhea. Mouth/Throat: Mucous membranes are moist.   Neck:  supple no lymphadenopathy noted Cardiovascular: Normal rate, regular rhythm. Heart sounds are  normal Respiratory: Normal respiratory effort.  No retractions, lungs c t a  Abd: soft nontender bs normal all 4 quad GU: deferred Musculoskeletal: FROM all extremities, warm and well perfused.  Decreased rom of back due to discomfort, lumbar spine tender in the paravertebral muscles, negative slr, 5/5 strength in great toes b/l, 5/5 strength in lower legs, n/v intact Neurologic:  Normal speech and language.  Skin:  Skin is warm, dry and intact. No rash noted. Psychiatric: Mood and affect are normal. Speech and behavior are normal.  ____________________________________________   LABS (all labs ordered are listed, but only abnormal results are displayed)  Labs Reviewed - No data to display ____________________________________________   ____________________________________________  RADIOLOGY    ____________________________________________   PROCEDURES  Procedure(s) performed: Toradol 30 mg IM   Procedures    ____________________________________________   INITIAL IMPRESSION / ASSESSMENT AND PLAN / ED COURSE  Pertinent labs & imaging results that were available during my care of the patient were reviewed by me and considered in my medical decision making (see chart for details).   Patient is a 45 year old female presents with  chronic low back pain.  See HPI.  Physical exam shows patient appears stable.  I did explain to the patient cannot change her narcotic in regimen from the pain clinic.  However we gave her a Toradol injection here in the ED.  Prescription for Medrol Dosepak.  And a work note.  She was discharged stable condition.     As part of my medical decision making, I reviewed the following data within the [place] notes reviewed and incorporated, Old chart reviewed, Notes from prior ED visits, and [place] Controlled Substance Database  ____________________________________________   FINAL CLINICAL IMPRESSION(S) / ED DIAGNOSES  Final diagnoses:  Chronic midline low back pain without sciatica      NEW MEDICATIONS STARTED DURING THIS VISIT:  New Prescriptions   METHYLPREDNISOLONE (MEDROL DOSEPAK) 4 MG TBPK TABLET    Take 6 pills on day one then decrease by 1 pill each day     Note:  This document was prepared using Dragon voice recognition software and may include unintentional dictation errors.     Versie Starks, PA-C 06/27/21 0732    Harvest Dark, MD 06/28/21 0430

## 2021-06-27 NOTE — Discharge Instructions (Addendum)
Follow-up with your regular doctor if not improving in 2 to 3 days.  Return emergency department worsening.  Take your regular pain medications as prescribed by your pain clinic doctor.  Take the Medrol Dosepak as prescribed

## 2022-01-03 ENCOUNTER — Other Ambulatory Visit (HOSPITAL_COMMUNITY): Payer: Self-pay | Admitting: Nurse Practitioner

## 2022-01-03 ENCOUNTER — Other Ambulatory Visit: Payer: Self-pay | Admitting: Nurse Practitioner

## 2022-01-03 DIAGNOSIS — M79662 Pain in left lower leg: Secondary | ICD-10-CM

## 2022-06-07 ENCOUNTER — Ambulatory Visit
Admit: 2022-06-07 | Discharge: 2022-06-07 | Payer: BLUE CROSS/BLUE SHIELD | Attending: Physician Assistant | Primary: Diagnostic Radiology

## 2022-06-07 ENCOUNTER — Encounter

## 2022-06-07 DIAGNOSIS — R112 Nausea with vomiting, unspecified: Secondary | ICD-10-CM

## 2022-06-07 LAB — AMB POC URINALYSIS DIP STICK AUTO W/O MICRO
Bilirubin, Urine, POC: NEGATIVE
Blood (UA POC): NEGATIVE
Glucose, Urine, POC: NEGATIVE
Ketones, Urine, POC: NEGATIVE
Leukocyte Esterase, Urine, POC: NEGATIVE
Nitrite, Urine, POC: NEGATIVE
Protein, Urine, POC: 30
Specific Gravity, Urine, POC: 1.02 (ref 1.001–1.035)
Urobilinogen, POC: 1
pH, Urine, POC: 8.5 — AB (ref 4.6–8.0)

## 2022-06-07 LAB — CULTURE, URINE: FINAL REPORT: >100000

## 2022-06-07 LAB — AMB POC URINE PREGNANCY TEST, VISUAL COLOR COMPARISON: HCG, Pregnancy, Urine, POC: NEGATIVE

## 2022-06-07 MED ORDER — ONDANSETRON 4 MG PO TBDP
4 MG | ORAL_TABLET | Freq: Three times a day (TID) | ORAL | 0 refills | Status: AC | PRN
Start: 2022-06-07 — End: ?

## 2022-06-07 NOTE — Progress Notes (Signed)
ASSESSMENT/PLAN:     1. Nausea and vomiting, unspecified vomiting type  -     POC Urinalysis, Auto W/O Scope (81003)  -     AMB POC URINE PREGNANCY TEST, VISUAL COLOR COMPARISON  -     Culture, Urine  -     Comprehensive Metabolic Panel  -     ondansetron (ZOFRAN-ODT) 4 MG disintegrating tablet; Take 1 tablet by mouth 3 times daily as needed for Nausea or Vomiting, Disp-21 tablet, R-0Normal  2. Abnormal finding on urinalysis  -     Culture, Urine  -     CBC with Auto Differential  3. Fatigue, unspecified type  -     CBC with Auto Differential  -     Comprehensive Metabolic Panel  -     TSH with Reflex; Future  4. History of gastroesophageal reflux (GERD)  -     CBC with Auto Differential      Results for orders placed or performed in visit on 06/07/22   POC Urinalysis, Auto W/O Scope (81003)   Result Value Ref Range    Color (UA POC)      Clarity (UA POC)      Glucose, Urine, POC Negative Negative    Bilirubin, Urine, POC Negative Negative    Ketones, Urine, POC Negative Negative    Specific Gravity, Urine, POC 1.020 1.001 - 1.035    Blood (UA POC) Negative Negative    pH, Urine, POC 8.5 (A) 4.6 - 8.0    Protein, Urine, POC 30 Negative    Urobilinogen, POC 1 mg/dL     Nitrite, Urine, POC Negative Negative    Leukocyte Esterase, Urine, POC Negative Negative   AMB POC URINE PREGNANCY TEST, VISUAL COLOR COMPARISON   Result Value Ref Range    Valid Internal Control, POC Pass     HCG, Pregnancy, Urine, POC Negative Negative     Pt with h/o GERD, Fatigue, HTN, HLD, DVT on Xarelto presenting today for concerns of vomiting.  Discussed Ddx, including gastroenteritis, GERD/gastritis, other GI concerns, among other causes.  Urine pregnancy negative.  UA shows elevated pH but pt denies UTI sxs, sending urine out for culture.  Checking CBC to r/o anemia, infection and CMP to assess electrolytes, kidney/liver abnormalities.  Urine/bloodwork pending.      Treating with Zofran as needed.  Medication dosing reviewed.  Encouraged  hydration, supplementing with Gatorade and/or Pedialyte if needed.  Brat diet, advancing as tolerated.  Encourage diet favorable for GERD.  Discussed signs and symptoms to monitor.  Strict ER precautions given if any persistent/worsening symptoms, including any signs of acute abdomen.  Follow-up if no improvement/worsening symptoms.    Return if symptoms worsen or fail to improve.          CHIEF COMPLAINT:  Tracy Carroll (DOB:  Sep 01, 1976) is a 46 y.o. female, here for evaluation of the following chief complaint(s):  Emesis (She has been vomiting since 0500)      HPI:  Tracy Carroll is a 46 year old female with a history of hypertension, hyperlipidemia, and DVT in left leg for which she takes blood thinners, presents today for evaluation.  She has vomited 4-5 times since this morning around 5 AM.  She thinks she is vomiting up food, denying any hemoptysis.  She states that she has been fatigued but that has been for "quite some time ".  She does have some urinary frequency but states this is due to her diuretic.  She had a  grilled cheese sandwich for breakfast yesterday and a salad for dinner around 4 PM.  She went to bed at 11 PM.  She denies any fever or chills.  No dizziness or headaches.  No URI symptoms.  No cough.  No shortness of breath or chest pains.  No abdominal pain.  Denies any reflux symptoms.  No changes in bowel habits.  No changes in urinary habits.  No sick contacts.      Emesis   Pertinent negatives include no chest pain, chills, coughing, diarrhea, dizziness, fever, headaches or myalgias.     CURRENT MEDICATIONS:  Outpatient Medications Marked as Taking for the 06/07/22 encounter (Office Visit) with Jason Nest, PA   Medication Sig Dispense Refill    ROSUVASTATIN CALCIUM PO Take by mouth      gabapentin (NEURONTIN) 100 MG capsule Take 1 capsule by mouth 3 times daily.      rivaroxaban (XARELTO) 10 MG TABS tablet Take 1 tablet by mouth      hydroCHLOROthiazide (HYDRODIURIL) 25 MG tablet Take 1 tablet  by mouth daily      POTASSIUM PO Take by mouth      ondansetron (ZOFRAN-ODT) 4 MG disintegrating tablet Take 1 tablet by mouth 3 times daily as needed for Nausea or Vomiting 21 tablet 0       REVIEW OF SYSTEMS:  Review of Systems   Constitutional:  Negative for chills and fever.   HENT:  Negative for congestion and sore throat.    Respiratory:  Negative for cough and shortness of breath.    Cardiovascular:  Negative for chest pain.   Gastrointestinal:  Positive for nausea and vomiting. Negative for constipation and diarrhea.   Genitourinary:  Negative for flank pain, hematuria, pelvic pain and urgency.   Musculoskeletal:  Negative for myalgias.   Skin:  Negative for rash.   Neurological:  Negative for dizziness and headaches.     ALLERGIES:  No Known Allergies     PAST MEDICAL HISTORY:  Past Medical History:   Diagnosis Date    DVT (deep venous thrombosis) (HCC)     Left leg    Hyperlipidemia     Hypertension        PAST SURGICAL HISTORY:  History reviewed. No pertinent surgical history.    FAMILY HISTORY:  History reviewed. No pertinent family history.    TOBACCO HISTORY:  Social History     Tobacco Use   Smoking Status Never   Smokeless Tobacco Never       SOCIAL HISTORY:  Social History     Socioeconomic History    Marital status: Divorced     Spouse name: None    Number of children: None    Years of education: None    Highest education level: None   Tobacco Use    Smoking status: Never    Smokeless tobacco: Never   Vaping Use    Vaping Use: Never used   Substance and Sexual Activity    Alcohol use: Never    Drug use: Never       OB HISTORY:  OB History   No obstetric history on file.       VITAL SIGNS:  Vitals:    06/07/22 1203   BP: 118/76   Pulse: 68   Resp: 18   Temp: 98.8 F (37.1 C)   TempSrc: Tympanic   SpO2: 99%   Weight: 179 lb 8 oz (81.4 kg)       PHYSICAL EXAM:  Physical Exam  Vitals reviewed.   Constitutional:       General: She is not in acute distress.     Appearance: She is not toxic-appearing.    HENT:      Head: Normocephalic and atraumatic.      Right Ear: Tympanic membrane normal.      Left Ear: Tympanic membrane normal.      Mouth/Throat:      Pharynx: No oropharyngeal exudate or posterior oropharyngeal erythema.   Eyes:      Extraocular Movements: Extraocular movements intact.      Conjunctiva/sclera: Conjunctivae normal.   Cardiovascular:      Rate and Rhythm: Normal rate and regular rhythm.      Heart sounds: No murmur heard.    No friction rub. No gallop.   Pulmonary:      Effort: Pulmonary effort is normal. No respiratory distress.      Breath sounds: Normal breath sounds. No wheezing or rhonchi.   Abdominal:      General: Bowel sounds are normal. There is no distension.      Palpations: Abdomen is soft. There is no mass.      Tenderness: There is no abdominal tenderness. There is no guarding or rebound.   Skin:     General: Skin is warm and dry.      Findings: No rash.   Neurological:      General: No focal deficit present.      Mental Status: She is alert and oriented to person, place, and time.   Psychiatric:         Mood and Affect: Mood normal.         Behavior: Behavior normal.              An electronic signature was used to authenticate this note.    --Jason Nest, PA

## 2022-06-08 LAB — CBC WITH AUTO DIFFERENTIAL
Absolute Baso #: 0 10*3/uL (ref 0.0–0.2)
Absolute Eos #: 0 10*3/uL (ref 0.0–0.5)
Absolute Lymph #: 0.8 10*3/uL — ABNORMAL LOW (ref 1.0–3.2)
Absolute Mono #: 0.4 10*3/uL (ref 0.3–1.0)
Basophils %: 0.7 % (ref 0.0–2.0)
Eosinophils %: 0.3 % (ref 0.0–7.0)
Hematocrit: 41.2 % (ref 34.0–47.0)
Hemoglobin: 13.5 g/dL (ref 11.5–15.7)
Immature Grans (Abs): 0.02 10*3/uL (ref 0.00–0.06)
Immature Granulocytes: 0.3 % (ref 0.0–0.6)
Lymphocytes: 13.6 % — ABNORMAL LOW (ref 15.0–45.0)
MCH: 29.4 pg (ref 27.0–34.5)
MCHC: 32.8 g/dL (ref 32.0–36.0)
MCV: 89.8 fL (ref 81.0–99.0)
MPV: 11.6 fL (ref 7.2–13.2)
Monocytes: 6.2 % (ref 4.0–12.0)
NRBC Absolute: 0 10*3/uL (ref 0.000–0.012)
NRBC Automated: 0 % (ref 0.0–0.2)
Neutrophils %: 78.9 % — ABNORMAL HIGH (ref 42.0–74.0)
Neutrophils Absolute: 4.7 10*3/uL (ref 1.6–7.3)
Platelets: 232 10*3/uL (ref 140–440)
RBC: 4.59 x10e6/mcL (ref 3.60–5.20)
RDW: 12.5 % (ref 11.0–16.0)
WBC: 6 10*3/uL (ref 3.8–10.6)

## 2022-06-08 LAB — COMPREHENSIVE METABOLIC PANEL
ALT: 18 U/L (ref 0–35)
AST: 20 U/L (ref 0–35)
Albumin/Globulin Ratio: 1.3 (ref 1.00–2.70)
Albumin: 4.5 g/dL (ref 3.5–5.2)
Alk Phosphatase: 80 U/L (ref 35–117)
Anion Gap: 13 mmol/L (ref 2–17)
BUN: 11 mg/dL (ref 6–20)
CO2: 29 mmol/L (ref 22–29)
Calcium: 9.1 mg/dL (ref 8.6–10.0)
Chloride: 101 mmol/L (ref 98–107)
Creatinine: 0.7 mg/dL (ref 0.5–1.0)
Est, Glom Filt Rate: 107 mL/min/1.73m (ref >=60)
Globulin: 3.4 g/dL (ref 1.9–4.4)
Glucose: 109 mg/dL — ABNORMAL HIGH (ref 70–99)
OSMOLALITY CALCULATED: 285 mOsm/kg (ref 270–287)
Potassium: 3.8 mmol/L (ref 3.5–5.3)
Sodium: 143 mmol/L (ref 135–145)
Total Bilirubin: 0.65 mg/dL (ref 0.00–1.20)
Total Protein: 7.9 g/dL (ref 6.4–8.3)

## 2022-06-08 LAB — TSH WITH REFLEX: TSH: 0.603 mcIU/mL (ref 0.358–3.740)

## 2022-06-08 NOTE — Telephone Encounter (Signed)
The urine is contaminated.  No indication of a definite UTI.

## 2022-06-08 NOTE — Telephone Encounter (Signed)
SPOKE TO PATIENT SHES AWARE OF ALL STABLE LABS

## 2022-06-08 NOTE — Telephone Encounter (Signed)
Bloodwork returned.  All Stable.    CBC - no infection, no anemia  CMP - electrolytes, kidney function, liver function stable  Thyroid stable.

## 2022-06-09 NOTE — Telephone Encounter (Signed)
Spk to pt and informed urine cx contaminated therefore unable to definitively say UTI or not ,  pt states still having abd pain s/s as she did at OV ,  advised her IF s/s were worse would recommend going to ER ,  but if s/s were the same would recommend f/u with primary or here but again stressed if s/s were worse to go to ER ;   pt voiced understanding and appreciation

## 2022-09-19 ENCOUNTER — Encounter (INDEPENDENT_AMBULATORY_CARE_PROVIDER_SITE_OTHER): Payer: Self-pay

## 2023-04-07 ENCOUNTER — Ambulatory Visit
Admit: 2023-04-07 | Discharge: 2023-04-07 | Payer: PRIVATE HEALTH INSURANCE | Attending: Physician Assistant | Primary: Diagnostic Radiology

## 2023-04-07 DIAGNOSIS — R109 Unspecified abdominal pain: Secondary | ICD-10-CM

## 2023-04-07 NOTE — Progress Notes (Signed)
No billable service was conducted by the provider due to insurance issues.  The patient was also instructed to go to the ER who could see her.

## 2023-06-30 ENCOUNTER — Inpatient Hospital Stay
Admit: 2023-06-30 | Discharge: 2023-06-30 | Disposition: A | Discharge status: Home / Self Care | Payer: Auto Insurance (includes no fault) | Attending: Emergency Medicine

## 2023-06-30 ENCOUNTER — Emergency Department: Admit: 2023-06-30 | Payer: Auto Insurance (includes no fault) | Primary: Diagnostic Radiology

## 2023-06-30 DIAGNOSIS — S86811A Strain of other muscle(s) and tendon(s) at lower leg level, right leg, initial encounter: Secondary | ICD-10-CM
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
## 2024-02-27 ENCOUNTER — Other Ambulatory Visit: Payer: Self-pay

## 2024-02-27 ENCOUNTER — Emergency Department
Admission: EM | Admit: 2024-02-27 | Discharge: 2024-02-27 | Discharge status: Against Medical Advice | Attending: Emergency Medicine | Admitting: Emergency Medicine

## 2024-02-27 ENCOUNTER — Telehealth: Payer: Self-pay | Admitting: Emergency Medicine

## 2024-02-27 DIAGNOSIS — R109 Unspecified abdominal pain: Secondary | ICD-10-CM | POA: Diagnosis present

## 2024-02-27 DIAGNOSIS — Z5321 Procedure and treatment not carried out due to patient leaving prior to being seen by health care provider: Secondary | ICD-10-CM | POA: Diagnosis not present

## 2024-02-27 DIAGNOSIS — R112 Nausea with vomiting, unspecified: Secondary | ICD-10-CM | POA: Diagnosis not present

## 2024-02-27 LAB — CBC
HCT: 36 % (ref 36.0–46.0)
Hemoglobin: 11.9 g/dL — ABNORMAL LOW (ref 12.0–15.0)
MCH: 29.2 pg (ref 26.0–34.0)
MCHC: 33.1 g/dL (ref 30.0–36.0)
MCV: 88.2 fL (ref 80.0–100.0)
Platelets: 207 10*3/uL (ref 150–400)
RBC: 4.08 MIL/uL (ref 3.87–5.11)
RDW: 13.1 % (ref 11.5–15.5)
WBC: 6.3 10*3/uL (ref 4.0–10.5)
nRBC: 0 % (ref 0.0–0.2)

## 2024-02-27 LAB — COMPREHENSIVE METABOLIC PANEL WITH GFR
ALT: 28 U/L (ref 0–44)
AST: 31 U/L (ref 15–41)
Albumin: 3.4 g/dL — ABNORMAL LOW (ref 3.5–5.0)
Alkaline Phosphatase: 69 U/L (ref 38–126)
Anion gap: 11 (ref 5–15)
BUN: 21 mg/dL — ABNORMAL HIGH (ref 6–20)
CO2: 23 mmol/L (ref 22–32)
Calcium: 8.8 mg/dL — ABNORMAL LOW (ref 8.9–10.3)
Chloride: 104 mmol/L (ref 98–111)
Creatinine, Ser: 0.71 mg/dL (ref 0.44–1.00)
GFR, Estimated: >60 mL/min (ref >60)
Glucose, Bld: 131 mg/dL — ABNORMAL HIGH (ref 70–99)
Potassium: 2.7 mmol/L — CL (ref 3.5–5.1)
Sodium: 138 mmol/L (ref 135–145)
Total Bilirubin: 0.7 mg/dL (ref 0.0–1.2)
Total Protein: 7.6 g/dL (ref 6.5–8.1)

## 2024-02-27 LAB — LIPASE, BLOOD: Lipase: 28 U/L (ref 11–51)

## 2024-02-27 NOTE — ED Triage Notes (Signed)
 Pt to ED via EMS from home, pt reports abd pain that began less than an hour ago with n/v. Pt not answering questions in triage.

## 2024-02-27 NOTE — Telephone Encounter (Signed)
 Called patient due to left emergency department before provider exam to inquire about condition and follow up plans. Left message.

## 2024-02-29 ENCOUNTER — Emergency Department

## 2024-02-29 ENCOUNTER — Observation Stay
Admission: EM | Admit: 2024-02-29 | Discharge: 2024-03-01 | Disposition: A | Discharge status: Home / Self Care | Attending: Internal Medicine | Admitting: Internal Medicine

## 2024-02-29 ENCOUNTER — Other Ambulatory Visit: Payer: Self-pay

## 2024-02-29 ENCOUNTER — Encounter: Payer: Self-pay | Admitting: Family Medicine

## 2024-02-29 ENCOUNTER — Ambulatory Visit: Admitting: Cardiology

## 2024-02-29 DIAGNOSIS — I82412 Acute embolism and thrombosis of left femoral vein: Secondary | ICD-10-CM | POA: Diagnosis not present

## 2024-02-29 DIAGNOSIS — K219 Gastro-esophageal reflux disease without esophagitis: Secondary | ICD-10-CM | POA: Diagnosis not present

## 2024-02-29 DIAGNOSIS — I10 Essential (primary) hypertension: Secondary | ICD-10-CM | POA: Diagnosis present

## 2024-02-29 DIAGNOSIS — M940 Chondrocostal junction syndrome [Tietze]: Secondary | ICD-10-CM | POA: Insufficient documentation

## 2024-02-29 DIAGNOSIS — Z6841 Body Mass Index (BMI) 40.0 and over, adult: Secondary | ICD-10-CM | POA: Diagnosis not present

## 2024-02-29 DIAGNOSIS — R079 Chest pain, unspecified: Secondary | ICD-10-CM | POA: Insufficient documentation

## 2024-02-29 DIAGNOSIS — R519 Headache, unspecified: Secondary | ICD-10-CM

## 2024-02-29 DIAGNOSIS — Z79899 Other long term (current) drug therapy: Secondary | ICD-10-CM | POA: Diagnosis not present

## 2024-02-29 DIAGNOSIS — E876 Hypokalemia: Secondary | ICD-10-CM | POA: Diagnosis not present

## 2024-02-29 DIAGNOSIS — E66813 Obesity, class 3: Secondary | ICD-10-CM | POA: Diagnosis not present

## 2024-02-29 DIAGNOSIS — G4489 Other headache syndrome: Secondary | ICD-10-CM | POA: Diagnosis not present

## 2024-02-29 LAB — BASIC METABOLIC PANEL WITH GFR
Anion gap: 7 (ref 5–15)
BUN: 10 mg/dL (ref 6–20)
CO2: 26 mmol/L (ref 22–32)
Calcium: 8.4 mg/dL — ABNORMAL LOW (ref 8.9–10.3)
Chloride: 106 mmol/L (ref 98–111)
Creatinine, Ser: 0.64 mg/dL (ref 0.44–1.00)
GFR, Estimated: >60 mL/min (ref >60)
Glucose, Bld: 79 mg/dL (ref 70–99)
Potassium: 3.3 mmol/L — ABNORMAL LOW (ref 3.5–5.1)
Sodium: 139 mmol/L (ref 135–145)

## 2024-02-29 LAB — URINE DRUG SCREEN, QUALITATIVE (ARMC ONLY)
Amphetamines, Ur Screen: NOT DETECTED
Barbiturates, Ur Screen: NOT DETECTED
Benzodiazepine, Ur Scrn: NOT DETECTED
Cannabinoid 50 Ng, Ur ~~LOC~~: NOT DETECTED
Cocaine Metabolite,Ur ~~LOC~~: NOT DETECTED
MDMA (Ecstasy)Ur Screen: NOT DETECTED
Methadone Scn, Ur: NOT DETECTED
Opiate, Ur Screen: NOT DETECTED
Phencyclidine (PCP) Ur S: NOT DETECTED
Tricyclic, Ur Screen: POSITIVE — AB

## 2024-02-29 LAB — COMPREHENSIVE METABOLIC PANEL WITH GFR
ALT: 30 U/L (ref 0–44)
AST: 28 U/L (ref 15–41)
Albumin: 3.9 g/dL (ref 3.5–5.0)
Alkaline Phosphatase: 69 U/L (ref 38–126)
Anion gap: 13 (ref 5–15)
BUN: 20 mg/dL (ref 6–20)
CO2: 23 mmol/L (ref 22–32)
Calcium: 9.1 mg/dL (ref 8.9–10.3)
Chloride: 100 mmol/L (ref 98–111)
Creatinine, Ser: 0.79 mg/dL (ref 0.44–1.00)
GFR, Estimated: >60 mL/min (ref >60)
Glucose, Bld: 126 mg/dL — ABNORMAL HIGH (ref 70–99)
Potassium: 2.6 mmol/L — CL (ref 3.5–5.1)
Sodium: 136 mmol/L (ref 135–145)
Total Bilirubin: 1.2 mg/dL (ref 0.0–1.2)
Total Protein: 8.2 g/dL — ABNORMAL HIGH (ref 6.5–8.1)

## 2024-02-29 LAB — CBC WITH DIFFERENTIAL/PLATELET
Abs Immature Granulocytes: 0.02 10*3/uL (ref 0.00–0.07)
Basophils Absolute: 0.1 10*3/uL (ref 0.0–0.1)
Basophils Relative: 1 %
Eosinophils Absolute: 0 10*3/uL (ref 0.0–0.5)
Eosinophils Relative: 1 %
HCT: 35.9 % — ABNORMAL LOW (ref 36.0–46.0)
Hemoglobin: 11.8 g/dL — ABNORMAL LOW (ref 12.0–15.0)
Immature Granulocytes: 0 %
Lymphocytes Relative: 38 %
Lymphs Abs: 2.7 10*3/uL (ref 0.7–4.0)
MCH: 28.9 pg (ref 26.0–34.0)
MCHC: 32.9 g/dL (ref 30.0–36.0)
MCV: 88 fL (ref 80.0–100.0)
Monocytes Absolute: 0.8 10*3/uL (ref 0.1–1.0)
Monocytes Relative: 11 %
Neutro Abs: 3.6 10*3/uL (ref 1.7–7.7)
Neutrophils Relative %: 49 %
Platelets: 209 10*3/uL (ref 150–400)
RBC: 4.08 MIL/uL (ref 3.87–5.11)
RDW: 13.2 % (ref 11.5–15.5)
WBC: 7.2 10*3/uL (ref 4.0–10.5)
nRBC: 0 % (ref 0.0–0.2)

## 2024-02-29 LAB — D-DIMER, QUANTITATIVE: D-Dimer, Quant: 0.34 ug{FEU}/mL (ref 0.00–0.50)

## 2024-02-29 LAB — HIV ANTIBODY (ROUTINE TESTING W REFLEX): HIV Screen 4th Generation wRfx: NONREACTIVE

## 2024-02-29 LAB — MAGNESIUM: Magnesium: 2.3 mg/dL (ref 1.7–2.4)

## 2024-02-29 LAB — TROPONIN I (HIGH SENSITIVITY)
Troponin I (High Sensitivity): 10 ng/L (ref <18)
Troponin I (High Sensitivity): 8 ng/L (ref <18)

## 2024-02-29 MED ORDER — POTASSIUM CHLORIDE CRYS ER 20 MEQ PO TBCR
40.0000 meq | EXTENDED_RELEASE_TABLET | Freq: Once | ORAL | Status: AC
  Administered 2024-02-29: 40 meq via ORAL
  Filled 2024-02-29: qty 2

## 2024-02-29 MED ORDER — SODIUM CHLORIDE 0.9 % IV BOLUS
1000.0000 mL | Freq: Once | INTRAVENOUS | Status: AC
  Administered 2024-02-29: 1000 mL via INTRAVENOUS

## 2024-02-29 MED ORDER — ONDANSETRON HCL 4 MG PO TABS: 4.0000 mg | ORAL_TABLET | Freq: Four times a day (QID) | ORAL | Status: DC | PRN

## 2024-02-29 MED ORDER — ENOXAPARIN SODIUM 60 MG/0.6ML IJ SOSY
50.0000 mg | PREFILLED_SYRINGE | INTRAMUSCULAR | Status: DC
  Administered 2024-02-29: 50 mg via SUBCUTANEOUS
  Filled 2024-02-29 (×2): qty 0.6

## 2024-02-29 MED ORDER — POTASSIUM CHLORIDE 10 MEQ/100ML IV SOLN
10.0000 meq | INTRAVENOUS | Status: AC
  Administered 2024-02-29 (×2): 10 meq via INTRAVENOUS
  Filled 2024-02-29 (×2): qty 100

## 2024-02-29 MED ORDER — ONDANSETRON HCL 4 MG/2ML IJ SOLN: 4.0000 mg | Freq: Four times a day (QID) | INTRAMUSCULAR | Status: DC | PRN

## 2024-02-29 MED ORDER — ACETAMINOPHEN 325 MG PO TABS
650.0000 mg | ORAL_TABLET | Freq: Four times a day (QID) | ORAL | Status: DC | PRN
  Administered 2024-02-29: 650 mg via ORAL
  Filled 2024-02-29: qty 2

## 2024-02-29 NOTE — Assessment & Plan Note (Signed)
 K2.6 on presentation Recurrent issue-unclear if patient compliant with home oral potassium Magnesium within normal limits Repletion per protocol Trend

## 2024-02-29 NOTE — H&P (Addendum)
 History and Physical    Patient: Alexandra Montes:[id number] DOB: 1976/01/14 DOA: 02/29/2024 DOS: the patient was seen and examined on 02/29/2024 PCP: Orson Eva, NP  Patient coming from: Home  Chief Complaint:  Chief Complaint  Patient presents with   Chest Pain   HPI: Alexandra Montes is a 48 y.o. female with medical history significant of hypertension, GERD, headache, anxiety presenting with chest pain, hypokalemia.  Patient reports progressive onset of central chest pain over the past 1 to 2 days.  Mild nausea.  No diaphoresis.  Minimal shortness of breath.  Chest pain worse with movement as well as deep breathing.  No abdominal pain or diarrhea.  No focal hemiparesis or confusion.  Denies any tobacco use.  Patient denies any illicit drug use.  Patient does report remote history of DVT multiple years ago no longer on anticoagulation.  No recent prolonged trips.  Patient also somewhat unclear about her medication regimen at present.  No reported falls or head trauma. Presented to the ER afebrile, hemodynamically stable.  Satting well on room air.  White count 7.2, hemoglobin 11.8, platelets 209, potassium 2.6, glucose 126.  Troponin negative x 2.  EKG normal sinus rhythm.  Chest x-ray and CT head grossly within normal limits. Review of Systems: As mentioned in the history of present illness. All other systems reviewed and are negative. Past Medical History:  Diagnosis Date   Anxiety    Carpal tunnel syndrome    Cauliflower ear, right    DVT of deep femoral vein, left (HCC)    GERD (gastroesophageal reflux disease)    Headache    Hypertension    Ovarian cyst    Right   Past Surgical History:  Procedure Laterality Date   AMPUTATION FINGER     AMPUTATION TOE     NO PAST SURGERIES     Social History:  reports that she has never smoked. She has never used smokeless tobacco. She reports that she does not drink alcohol and does not use drugs.  No Known Allergies  Family History   Problem Relation Age of Onset   Heart disease Mother    Bipolar disorder Mother    Drug abuse Mother    Alcohol abuse Mother    Breast cancer Mother 22   Schizophrenia Father    Lung cancer Maternal Grandfather     Prior to Admission medications   Medication Sig Start Date End Date Taking? Authorizing Provider  acetaminophen (TYLENOL) 325 MG tablet Take 650 mg by mouth 4 (four) times daily.    [provider]  albuterol (VENTOLIN HFA) 108 (90 Base) MCG/ACT inhaler ProAir HFA 108 (90 Base) MCG/ACT Inhalation Aerosol Solution QTY: 1 inhaler Days: 30 Refills: 1  Written: 02/27/18 Patient Instructions: Inhale 1 puff orally every 6 hours as needed for shortness of breath 02/27/18   [provider]  citalopram (CELEXA) 10 MG tablet Take 1 tablet (10 mg total) by mouth daily. 02/12/18   Jomarie Longs, MD  ergocalciferol (VITAMIN D2) 1.25 MG (50000 UT) capsule Ergocalciferol 1.25 MG (50000 UT) Oral Capsule QTY: 12 capsule Days: 84 Refills: 3  Written: 03/14/19 Patient Instructions: Take 1 capsule by mouth WEEKLY 03/14/19   [provider]  fluticasone (FLONASE) 50 MCG/ACT nasal spray Fluticasone Propionate 50 MCG/ACT Nasal Suspension QTY: 1 bottle Days: 30 Refills: 4  Written: 02/19/18 Patient Instructions: Lean head forward, instill 1 spray in each nostril daily with a gentle sniff 02/19/18   [provider]  hydrochlorothiazide (  HYDRODIURIL) 25 MG tablet Take 25 mg by mouth daily. 12/14/17   [provider]  loratadine (CLARITIN) 10 MG tablet TAKE 1 TABLET BY MOUTH ONCE DAILY AS NEEDED FOR SEASONAL ALLERGY SYMPTOMS. 01/11/19   [provider]  losartan (COZAAR) 50 MG tablet Take 50 mg by mouth daily. 01/06/18   [provider]  methylPREDNISolone (MEDROL DOSEPAK) 4 MG TBPK tablet Take 6 pills on day one then decrease by 1 pill each day 06/27/21   Faythe Ghee, PA-C  mupirocin ointment (BACTROBAN) 2 % Apply 1 application topically 3 (three)  times daily. 06/05/17   Hassan Rowan, MD  omeprazole (PRILOSEC) 40 MG capsule Omeprazole 40 MG Oral Capsule Delayed Release QTY: 30 capsule Days: 30 Refills: 0  Written: 02/26/19 Patient Instructions: Take 1 capsule by mouth daily in AM 30 min prior to breakfast x 1 month 02/26/19   [provider]  pregabalin (LYRICA) 50 MG capsule Take 1 capsule (50 mg total) by mouth 3 (three) times daily. 11/09/20   Edward Jolly, MD  rivaroxaban (XARELTO) 10 MG TABS tablet Take 1 tablet (10 mg total) by mouth daily. 02/07/20   Rickard Patience, MD  sucralfate (CARAFATE) 1 g tablet Carafate 1 GM Oral Tablet QTY: 120 tablet Days: 30 Refills: 0  Written: 02/26/19 Patient Instructions: Take 1 tablet by mouth four times daily after meals (dissolve in 3 tablespoons of water) 02/26/19   [provider]  tiZANidine (ZANAFLEX) 4 MG capsule Take 4 mg by mouth 3 (three) times daily.    [provider]    Physical Exam: Vitals:   02/29/24 0407 02/29/24 0411 02/29/24 0630  BP:  128/89 130/81  Pulse:  63 78  Resp:  18 (!) 24  Temp:  98 F (36.7 C)   TempSrc:  Oral   SpO2:  100%   Height: 4\' 11"  (1.499 m)     Physical Exam Constitutional:      Appearance: She is normal weight.  HENT:     Head: Normocephalic and atraumatic.     Nose: Nose normal.     Mouth/Throat:     Mouth: Mucous membranes are moist.  Eyes:     Pupils: Pupils are equal, round, and reactive to light.  Cardiovascular:     Rate and Rhythm: Normal rate and regular rhythm.  Pulmonary:     Effort: Pulmonary effort is normal.  Abdominal:     General: Bowel sounds are normal.  Musculoskeletal:        General: Normal range of motion.  Skin:    General: Skin is warm.  Neurological:     General: No focal deficit present.  Psychiatric:        Mood and Affect: Mood normal.     Data Reviewed:  There are no new results to review at this time.  CT Head Wo Contrast CLINICAL DATA:  Headaches with increasing  severity/frequency.  EXAM: CT HEAD WITHOUT CONTRAST  TECHNIQUE: Contiguous axial images were obtained from the base of the skull through the vertex without intravenous contrast.  RADIATION DOSE REDUCTION: This exam was performed according to the departmental dose-optimization program which includes automated exposure control, adjustment of the mA and/or kV according to patient size and/or use of iterative reconstruction technique.  COMPARISON:  None Available.  FINDINGS: Brain: No evidence of acute infarction, hemorrhage, hydrocephalus, extra-axial collection or mass lesion/mass effect.  Vascular: No hyperdense vessel or unexpected calcification.  Skull: Normal. Negative for fracture or focal lesion.  Sinuses/Orbits: No acute  finding.  Other: None.  IMPRESSION: No acute intracranial abnormalities.  Electronically Signed   By: Signa Kell M.D.   On: 02/29/2024 06:35 DG Chest 2 View CLINICAL DATA:  Chest pain  EXAM: CHEST - 2 VIEW  COMPARISON:  None Available.  FINDINGS: Normal heart size and mediastinal contours given low lung volumes. No acute infiltrate or edema. No effusion or pneumothorax. No acute osseous findings.  IMPRESSION: No active cardiopulmonary disease.  Electronically Signed   By: Tiburcio Pea M.D.   On: 02/29/2024 05:36  Lab Results  Component Value Date   WBC 7.2 02/29/2024   HGB 11.8 (L) 02/29/2024   HCT 35.9 (L) 02/29/2024   MCV 88.0 02/29/2024   PLT 209 02/29/2024   Last metabolic panel Lab Results  Component Value Date   GLUCOSE 126 (H) 02/29/2024   NA 136 02/29/2024   K 2.6 (LL) 02/29/2024   CL 100 02/29/2024   CO2 23 02/29/2024   BUN 20 02/29/2024   CREATININE 0.79 02/29/2024   GFRNONAA >60 02/29/2024   CALCIUM 9.1 02/29/2024   PROT 8.2 (H) 02/29/2024   ALBUMIN 3.9 02/29/2024   BILITOT 1.2 02/29/2024   ALKPHOS 69 02/29/2024   AST 28 02/29/2024   ALT 30 02/29/2024   ANIONGAP 13 02/29/2024    Assessment and  Plan: * Hypokalemia K2.6 on presentation Recurrent issue-unclear if patient compliant with home oral potassium Magnesium within normal limits Repletion per protocol Trend  Chest pain Costochondritis Recurrent central chest pain x 2 to 3 days-worse with movement and deep breathing Troponin negative x 2 EKG within normal limits Chest x-ray within normal notes Noted marked anterior chest wall pain consistent with costochondritis Will check D-dimer x 1 in the setting of history of DVT Pain control Monitor  Headache syndrome As needed Tylenol  Essential hypertension BP stable Titrate home regimen  Acute deep vein thrombosis (DVT) of femoral vein of left lower extremity (HCC) 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 Observation status is judged to be reasonable and necessary in order to provide the required intensity of service to ensure the patient's safety. The patient's presenting symptoms, physical exam findings, and initial radiographic and laboratory data in the context of their medical condition is felt to place them at decreased risk for further clinical deterioration. Furthermore, it is anticipated that the patient will be medically stable for discharge from the hospital within 2 midnights of admission.   Author: Floydene Flock, MD 02/29/2024 7:44 AM  For on call review [email or web address].

## 2024-02-29 NOTE — Progress Notes (Signed)
 PHARMACIST - PHYSICIAN COMMUNICATION  CONCERNING:  Enoxaparin (Lovenox) for DVT Prophylaxis    RECOMMENDATION: Patient was prescribed enoxaprin 40mg  q24 hours for VTE prophylaxis.   There were no vitals filed for this visit.  Body mass index is 44.43 kg/m.  Estimated Creatinine Clearance: 87.4 mL/min (by C-G formula based on SCr of 0.79 mg/dL).   Based on [facility] policy patient is candidate for enoxaparin 0.5mg /kg TBW SQ every 24 hours based on BMI being >30.  DESCRIPTION: Pharmacy has adjusted enoxaparin dose per [facility] policy.  Patient is now receiving enoxaparin 0.5 mg/kg every 24 hours   Otelia Sergeant, PharmD, [facility] 02/29/2024 7:44 AM

## 2024-02-29 NOTE — Assessment & Plan Note (Signed)
 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

## 2024-02-29 NOTE — ED Provider Notes (Signed)
 [facility] Provider Note    Event Date/Time   First MD Initiated Contact with Patient 02/29/24 [phone]     (approximate)   History   Chest Pain   HPI  Alexandra Montes is a 48 year old female presenting to the emergency department for multiple complaints.  In triage patient reported chest pain, but on my evaluation denies chest pain.  Tells me that she has a headache.  Reports taking some sort of medication prior to presentation but her headache has not improved.  Mother who is present at bedside reports the patient has had episodes where she is less interactive and is concerned that this may be related to the multiple medications that she takes.  She also notes that the patient has had multiple episodes of vomiting.  Patient denies abdominal pain.     Physical Exam   Triage Vital Signs: ED Triage Vitals  Encounter Vitals Group     BP 02/29/24 0411 128/89     Systolic BP Percentile --      Diastolic BP Percentile --      Pulse Rate 02/29/24 0411 63     Resp 02/29/24 0411 18     Temp 02/29/24 0411 98 F (36.7 C)     Temp Source 02/29/24 0411 Oral     SpO2 02/29/24 0411 100 %     Weight --      Height 02/29/24 0407 4\' 11"  (1.499 m)     Head Circumference --      Peak Flow --      Pain Score --      Pain Loc --      Pain Education --      Exclude from Growth Chart --     Most recent vital signs: Vitals:   02/29/24 0411 02/29/24 0630  BP: 128/89 130/81  Pulse: 63 78  Resp: 18 (!) 24  Temp: 98 F (36.7 C)   SpO2: 100%      General: Somnolent but arousable CV:  Regular rate, good peripheral perfusion.  Resp:  Unlabored respirations, lungs clear auscultation Abd:  Nondistended, soft, nontender to palpation Neuro:  Somnolent, but arouses and appropriately answering questions, symmetric facial movement, sensation intact over bilateral upper and lower extremities with 5 out of 5 strength.  Normal finger-to-nose testing.  ED Results / Procedures  / Treatments   Labs (all labs ordered are listed, but only abnormal results are displayed) Labs Reviewed  CBC WITH DIFFERENTIAL/PLATELET - Abnormal; Notable for the following components:      Result Value   Hemoglobin 11.8 (*)    HCT 35.9 (*)    All other components within normal limits  COMPREHENSIVE METABOLIC PANEL WITH GFR - Abnormal; Notable for the following components:   Potassium 2.6 (*)    Glucose, Bld 126 (*)    Total Protein 8.2 (*)    All other components within normal limits  MAGNESIUM  TROPONIN I (HIGH SENSITIVITY)  TROPONIN I (HIGH SENSITIVITY)     EKG EKG independently reviewed interpreted by myself (ER attending) demonstrates:  EKG demonstrates normal sinus rhythm rate of 79, PR 184, QRS 78, QTc 465, no acute ST changes  RADIOLOGY Imaging independently reviewed and interpreted by myself demonstrates:  CXR without focal consolidation CT head without acute bleed  PROCEDURES:  Critical Care performed: Yes, see critical care procedure note(s)  CRITICAL CARE Performed by: Trinna Post   Total critical care time: 31 minutes  Critical care time was exclusive of  separately billable procedures and treating other patients.  Critical care was necessary to treat or prevent imminent or life-threatening deterioration.  Critical care was time spent personally by me on the following activities: development of treatment plan with patient and/or surrogate as well as nursing, discussions with consultants, evaluation of patient's response to treatment, examination of patient, obtaining history from patient or surrogate, ordering and performing treatments and interventions, ordering and review of laboratory studies, ordering and review of radiographic studies, pulse oximetry and re-evaluation of patient's condition.   Procedures   MEDICATIONS ORDERED IN ED: Medications  potassium chloride 10 mEq in 100 mL IVPB (10 mEq Intravenous New Bag/Given 02/29/24 0603)  sodium  chloride 0.9 % bolus 1,000 mL (1,000 mLs Intravenous New Bag/Given 02/29/24 0558)     IMPRESSION / MDM / ASSESSMENT AND PLAN / ED COURSE  I reviewed the triage vital signs and the nursing notes.  Differential diagnosis includes, but is not limited to, medication adverse effect, electrolyte abnormality, intracranial bleed, other acute intracranial process  Patient's presentation is most consistent with acute presentation with potential threat to life or bodily function.  48 year old female presenting with multiple complaints.  Reported chest pain in triage, initial troponin and EKG reassuring.  Reporting headache on my evaluation, is somnolent but no focal findings on exam.  Will obtain head CT to further evaluate.  Labs sent from triage notable for significant hypokalemia with K of 2.6.  On review of her chart, she actually presented to the ER with abdominal pain 2 days ago, but left before seeing a provider.  K2.7 at that time.  7:02 AM CT head reassuring.  Patient continues to feel weak on reevaluation.  Has not had further vomiting episodes here, but with her worsening hypokalemia, do think admission for further monitoring is reasonable.  Will reach out to hospitalist team.  Clinical Course as of 02/29/24 0709  Thu Feb 29, 2024  0709 Case discussed with Dr. Alvester Morin.  He will evaluate for anticipated admission. [NR]    Clinical Course User Index [NR] Trinna Post, MD     FINAL CLINICAL IMPRESSION(S) / ED DIAGNOSES   Final diagnoses:  Hypokalemia  Acute nonintractable headache, unspecified headache type  Nonspecific chest pain     Rx / DC Orders   ED Discharge Orders     None        Note:  This document was prepared using Dragon voice recognition software and may include unintentional dictation errors.   Trinna Post, MD 02/29/24 [phone]

## 2024-02-29 NOTE — Progress Notes (Signed)
 PHARMACY CONSULT NOTE - ELECTROLYTES  Pharmacy Consult for Electrolyte Monitoring and Replacement   Recent Labs: Height: 4\' 11"  (149.9 cm) IBW/kg (Calculated) : 43.2 Estimated Creatinine Clearance: 87.4 mL/min (by C-G formula based on SCr of 0.79 mg/dL). Potassium (mmol/L)  Date Value  02/29/2024 2.6 (LL)  10/25/2012 3.3 (L)   Magnesium (mg/dL)  Date Value  16/08/9603 2.3   Calcium (mg/dL)  Date Value  54/07/8118 9.1   Calcium, Total (mg/dL)  Date Value  14/78/2956 8.8   Albumin (g/dL)  Date Value  21/30/8657 3.9  10/25/2012 3.7   Sodium (mmol/L)  Date Value  02/29/2024 136  10/25/2012 138     Assessment  Alexandra Montes is a 48 y.o. female presenting with multiple complaints, vomiting, ?chest pain. Presented to the ER with abdominal pain 2 days ago, but left before seeing a provider. PMH significant for  hypertension, GERD, headache, anxiety, remote DVT-not on anticoagulation . Pharmacy has been consulted to monitor and replace electrolytes.  Diet: heart MIVF: none Pertinent medications: none  (looks like on hydrochlorothiazide PTA)  Goal of Therapy: Electrolytes WNL  Plan:  K 2.6   MD ordered KCL 10 meq IV x 2 doses. - Will order additional KCL 10 meq IV x 2 and KCL 40 meq PO x 1 Recheck K at 1400 Check electrolytes with AM labs  Thank you for allowing pharmacy to be a part of this patient's care.  Angelique Blonder, PharmD Clinical Pharmacist 02/29/2024 7:58 AM

## 2024-02-29 NOTE — Progress Notes (Signed)
 PHARMACY CONSULT NOTE - ELECTROLYTES  Pharmacy Consult for Electrolyte Monitoring and Replacement   Recent Labs: Height: 4\' 11"  (149.9 cm) IBW/kg (Calculated) : 43.2 Estimated Creatinine Clearance: 87.4 mL/min (by C-G formula based on SCr of 0.64 mg/dL). Potassium (mmol/L)  Date Value  02/29/2024 3.3 (L)  10/25/2012 3.3 (L)   Magnesium (mg/dL)  Date Value  16/08/9603 2.3   Calcium (mg/dL)  Date Value  54/07/8118 8.4 (L)   Calcium, Total (mg/dL)  Date Value  14/78/2956 8.8   Albumin (g/dL)  Date Value  21/30/8657 3.9  10/25/2012 3.7   Sodium (mmol/L)  Date Value  02/29/2024 139  10/25/2012 138   Assessment  Alexandra Montes is a 48 y.o. female presenting with multiple complaints, vomiting, ?chest pain. Presented to the ER with abdominal pain 2 days ago, but left before seeing a provider. PMH significant for  hypertension, GERD, headache, anxiety, remote DVT-not on anticoagulation . Pharmacy has been consulted to monitor and replace electrolytes.  Diet: heart MIVF: none Pertinent medications: none (looks like on hydrochlorothiazide PTA)  Goal of Therapy: Electrolytes WNL  Plan:  K 3.3, Kcl 40 mEq PO x 1 dose Check electrolytes with AM labs  Thank you for allowing pharmacy to be a part of this patient's care.  Tressie Ellis 02/29/2024 4:17 PM

## 2024-02-29 NOTE — ED Notes (Signed)
 Pt transported to CT via stretcher at this time. Warm blanket provided per request prior to transport.

## 2024-02-29 NOTE — ED Triage Notes (Signed)
 EMS brings pt in from home for c/o rt sided CP and tylenol taken PTA without relief; pt to triage via w/c with no distress noted; pt st "it hurts"; unable to complete triage due to pt's refusal to answer any questions; when asked if pt has hx of current use of ETOH or drug use pt does st "no"; pt able to state her name, place and current date correctly when asked but again will not answer triage questions

## 2024-02-29 NOTE — Assessment & Plan Note (Signed)
-   As needed Tylenol -Outpatient ENT evaluation 

## 2024-02-29 NOTE — Assessment & Plan Note (Signed)
 BP stable Titrate home regimen

## 2024-02-29 NOTE — Assessment & Plan Note (Addendum)
 Costochondritis Recurrent central chest pain x 2 to 3 days-worse with movement and deep breathing Troponin negative x 2 EKG within normal limits Chest x-ray within normal notes Noted marked anterior chest wall pain consistent with costochondritis Will check D-dimer x 1 in the setting of history of DVT Pain control Monitor

## 2024-03-01 DIAGNOSIS — R0789 Other chest pain: Secondary | ICD-10-CM | POA: Diagnosis not present

## 2024-03-01 DIAGNOSIS — I10 Essential (primary) hypertension: Secondary | ICD-10-CM | POA: Diagnosis not present

## 2024-03-01 DIAGNOSIS — E876 Hypokalemia: Secondary | ICD-10-CM | POA: Diagnosis not present

## 2024-03-01 LAB — COMPREHENSIVE METABOLIC PANEL WITH GFR
ALT: 23 U/L (ref 0–44)
AST: 20 U/L (ref 15–41)
Albumin: 3.2 g/dL — ABNORMAL LOW (ref 3.5–5.0)
Alkaline Phosphatase: 57 U/L (ref 38–126)
Anion gap: 6 (ref 5–15)
BUN: 10 mg/dL (ref 6–20)
CO2: 27 mmol/L (ref 22–32)
Calcium: 8.7 mg/dL — ABNORMAL LOW (ref 8.9–10.3)
Chloride: 107 mmol/L (ref 98–111)
Creatinine, Ser: 0.64 mg/dL (ref 0.44–1.00)
GFR, Estimated: >60 mL/min (ref >60)
Glucose, Bld: 107 mg/dL — ABNORMAL HIGH (ref 70–99)
Potassium: 3.8 mmol/L (ref 3.5–5.1)
Sodium: 140 mmol/L (ref 135–145)
Total Bilirubin: 0.8 mg/dL (ref 0.0–1.2)
Total Protein: 7 g/dL (ref 6.5–8.1)

## 2024-03-01 LAB — CBC
HCT: 34 % — ABNORMAL LOW (ref 36.0–46.0)
Hemoglobin: 11.2 g/dL — ABNORMAL LOW (ref 12.0–15.0)
MCH: 29.3 pg (ref 26.0–34.0)
MCHC: 32.9 g/dL (ref 30.0–36.0)
MCV: 89 fL (ref 80.0–100.0)
Platelets: 193 10*3/uL (ref 150–400)
RBC: 3.82 MIL/uL — ABNORMAL LOW (ref 3.87–5.11)
RDW: 13.3 % (ref 11.5–15.5)
WBC: 4.8 10*3/uL (ref 4.0–10.5)
nRBC: 0 % (ref 0.0–0.2)

## 2024-03-01 MED ORDER — POTASSIUM CHLORIDE CRYS ER 10 MEQ PO TBCR
10.0000 meq | EXTENDED_RELEASE_TABLET | Freq: Two times a day (BID) | ORAL | Status: DC
Start: 2024-03-01 — End: 2024-04-19

## 2024-03-01 NOTE — Plan of Care (Signed)

## 2024-03-01 NOTE — Discharge Summary (Signed)
 Physician Discharge Summary   Patient: Alexandra Montes MRN: [id number] DOB: Sep 09, 1976  Admit date:     02/29/2024  Discharge date: 03/01/2024  Discharge Physician: Aisha Hove   PCP: Glendale Landmark, NP   Recommendations at discharge:    PCP follow up in 1 week. Follow BMP next week for potassium supplementation.  Discharge Diagnoses: Principal Problem:   Hypokalemia Active Problems:   Acute deep vein thrombosis (DVT) of femoral vein of left lower extremity (HCC)   Essential hypertension   Headache syndrome   Chest pain   Obesity, Class III, BMI 40-49.9 (morbid obesity) (HCC)  Resolved Problems:   * No resolved hospital problems. *  Hospital Course: Alexandra Montes is a 48 y.o. morbidly obese female with medical history significant of hypertension, GERD, headache, anxiety presenting with chest pain, hypokalemia.   Assessment and Plan: * Hypokalemia K2.6 on presentation This seems chronic problem-unclear if patient compliant with home oral potassium. Magnesium within normal limits She got IV potassium supplements. Advised to take 2 pills of Kcl 10meq which she has meds from prior script. Advised to follow PCP and recheck BMP for further supplementation as needed. She understands and agrees.  Chest pain Costochondritis Recurrent central chest pain x 2 to 3 days-worse with movement and deep breathing Troponin negative x 2, EKG within normal limits. D-dimer negative. Chest x-ray within normal notes. Noted marked anterior chest wall pain consistent with costochondritis Advised pain control with tylenol, outpatient PCP follow up.  Headache syndrome As needed Tylenol  Essential hypertension BP stable Home regimen resumed.  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       Consultants: none Procedures performed: none  Disposition: Home Diet recommendation:  Discharge Diet Orders (From admission, onward)     Start     Ordered   03/01/24 0000  Diet - low sodium heart healthy        03/01/24 0900           Cardiac diet DISCHARGE MEDICATION: Allergies as of 03/01/2024   No Known Allergies      Medication List     TAKE these medications    acetaminophen 325 MG tablet Commonly known as: TYLENOL Take 650 mg by mouth 4 (four) times daily.   albuterol 108 (90 Base) MCG/ACT inhaler Commonly known as: VENTOLIN HFA ProAir HFA 108 (90 Base) MCG/ACT Inhalation Aerosol Solution QTY: 1 inhaler Days: 30 Refills: 1  Written: 02/27/18 Patient Instructions: Inhale 1 puff orally every 6 hours as needed for shortness of breath   Aspirin Low Dose 81 MG chewable tablet Generic drug: aspirin Chew 81 mg by mouth daily.   citalopram 10 MG tablet Commonly known as: CELEXA Take 1 tablet (10 mg total) by mouth daily.   cyclobenzaprine 10 MG tablet Commonly known as: FLEXERIL Take 10 mg by mouth 3 (three) times daily as needed.   ergocalciferol 1.25 MG (50000 UT) capsule Commonly known as: VITAMIN D2 Ergocalciferol 1.25 MG (50000 UT) Oral Capsule QTY: 12 capsule Days: 84 Refills: 3  Written: 03/14/19 Patient Instructions: Take 1 capsule by mouth WEEKLY   fluticasone 50 MCG/ACT nasal spray Commonly known as: FLONASE Fluticasone Propionate 50 MCG/ACT Nasal Suspension QTY: 1 bottle Days: 30 Refills: 4  Written: 02/19/18 Patient Instructions: Lean head forward, instill 1 spray in each nostril daily with a gentle  sniff   gabapentin 100 MG capsule Commonly known as: NEURONTIN Take 100 mg by mouth 2 (two) times daily as needed (Nerve Pain).   hydrochlorothiazide 25 MG tablet Commonly known as: HYDRODIURIL Take 25 mg by mouth daily.   loratadine 10 MG tablet Commonly known as: CLARITIN TAKE 1 TABLET BY MOUTH ONCE DAILY AS NEEDED FOR SEASONAL ALLERGY  SYMPTOMS.   potassium chloride 10 MEQ tablet Commonly known as: KLOR-CON M Take 1 tablet (10 mEq total) by mouth 2 (two) times daily. What changed: when to take this   rosuvastatin 20 MG tablet Commonly known as: CRESTOR Take 20 mg by mouth daily.   SUMAtriptan 50 MG tablet Commonly known as: IMITREX Take 50 mg by mouth 2 (two) times daily as needed.   triamcinolone 0.025 % cream Commonly known as: KENALOG Apply 1 Application topically daily as needed (rash).        Discharge Exam: Filed Weights   02/29/24 2005  Weight: 99 kg      03/01/2024    9:10 AM 03/01/2024    4:37 AM 02/29/2024    9:01 PM  Vitals with BMI  Systolic 124 120 161  Diastolic 78 65 86  Pulse 72 83 83   General -  Young  morbidly obese African American female, no apparent distress HEENT - PERRLA, EOMI, atraumatic head, non tender sinuses. Lung - distant breath sounds, no rales, rhonchi, wheezes. Heart - S1, S2 heard, no murmurs, rubs, trace pedal edema. Abdomen - Soft, non tender, obese, bowel sounds good Neuro - Alert, awake and oriented x 3, non focal exam. Skin - Warm and dry.  Condition at discharge: stable  The results of significant diagnostics from this hospitalization (including imaging, microbiology, ancillary and laboratory) are listed below for reference.   Imaging Studies: CT Head Wo Contrast Result Date: 02/29/2024 CLINICAL DATA:  Headaches with increasing severity/frequency. EXAM: CT HEAD WITHOUT CONTRAST TECHNIQUE: Contiguous axial images were obtained from the base of the skull through the vertex without intravenous contrast. RADIATION DOSE REDUCTION: This exam was performed according to the departmental dose-optimization program which includes automated exposure control, adjustment of the mA and/or kV according to patient size and/or use of iterative reconstruction technique. COMPARISON:  None Available. FINDINGS: Brain: No evidence of acute infarction, hemorrhage, hydrocephalus,  extra-axial collection or mass lesion/mass effect. Vascular: No hyperdense vessel or unexpected calcification. Skull: Normal. Negative for fracture or focal lesion. Sinuses/Orbits: No acute finding. Other: None. IMPRESSION: No acute intracranial abnormalities. Electronically Signed   By: Kimberley Penman M.D.   On: 02/29/2024 06:35   DG Chest 2 View Result Date: 02/29/2024 CLINICAL DATA:  Chest pain EXAM: CHEST - 2 VIEW COMPARISON:  None Available. FINDINGS: Normal heart size and mediastinal contours given low lung volumes. No acute infiltrate or edema. No effusion or pneumothorax. No acute osseous findings. IMPRESSION: No active cardiopulmonary disease. Electronically Signed   By: Ronnette Coke M.D.   On: 02/29/2024 05:36    Microbiology: Results for orders placed or performed during the hospital encounter of 07/24/18  Group A Strep by PCR     Status: None   Collection Time: 07/24/18  9:54 PM   Specimen: Throat; Sterile Swab  Result Value Ref Range Status   Group A Strep by PCR NOT DETECTED NOT DETECTED Final    Comment: Performed at [facility], [address] [place], [place] [address]    Labs: CBC: Recent Labs  Lab 02/27/24 0415 02/29/24 0419 03/01/24 0507  WBC 6.3 7.2  4.8  NEUTROABS  --  3.6  --   HGB 11.9* 11.8* 11.2*  HCT 36.0 35.9* 34.0*  MCV 88.2 88.0 89.0  PLT 207 209 193   Basic Metabolic Panel: Recent Labs  Lab 02/27/24 0415 02/29/24 0419 02/29/24 1540 03/01/24 0507  NA 138 136 139 140  K 2.7* 2.6* 3.3* 3.8  CL 104 100 106 107  CO2 23 23 26 27   GLUCOSE 131* 126* 79 107*  BUN 21* 20 10 10   CREATININE 0.71 0.79 0.64 0.64  CALCIUM 8.8* 9.1 8.4* 8.7*  MG  --  2.3  --   --    Liver Function Tests: Recent Labs  Lab 02/27/24 0415 02/29/24 0419 03/01/24 0507  AST 31 28 20   ALT 28 30 23   ALKPHOS 69 69 57  BILITOT 0.7 1.2 0.8  PROT 7.6 8.2* 7.0  ALBUMIN 3.4* 3.9 3.2*   CBG: No results for input(s): "GLUCAP" in the last 168 hours.  Discharge  time spent: 33 minutes.  Signed: Aisha Hove, MD [facility] 03/02/2024

## 2024-03-01 NOTE — Progress Notes (Signed)
 PHARMACY CONSULT NOTE - ELECTROLYTES  Pharmacy Consult for Electrolyte Monitoring and Replacement   Recent Labs: Height: 4\' 11"  (149.9 cm) Weight: 99 kg (218 lb 4.1 oz) IBW/kg (Calculated) : 43.2 Estimated Creatinine Clearance: 87 mL/min (by C-G formula based on SCr of 0.64 mg/dL). Potassium (mmol/L)  Date Value  03/01/2024 3.8  10/25/2012 3.3 (L)   Magnesium (mg/dL)  Date Value  16/08/9603 2.3   Calcium (mg/dL)  Date Value  54/07/8118 8.7 (L)   Calcium, Total (mg/dL)  Date Value  14/78/2956 8.8   Albumin (g/dL)  Date Value  21/30/8657 3.2 (L)  10/25/2012 3.7   Sodium (mmol/L)  Date Value  03/01/2024 140  10/25/2012 138   Assessment  Alexandra Montes is a 48 y.o. female presenting with multiple complaints, vomiting, ?chest pain. Presented to the ER with abdominal pain 2 days ago, but left before seeing a provider. PMH significant for  hypertension, GERD, headache, anxiety, remote DVT-not on anticoagulation . Pharmacy has been consulted to monitor and replace electrolytes.  Diet: heart MIVF: none Pertinent medications: none (looks like on hydrochlorothiazide PTA)  Goal of Therapy: Electrolytes WNL  Plan:  No supplementation needed at this time Check electrolytes with AM labs  Thank you for allowing pharmacy to be a part of this patient's care.  Merryl Hacker, PharmD Clinical Pharmacist  03/01/2024 7:11 AM

## 2024-03-04 ENCOUNTER — Ambulatory Visit: Admitting: Cardiology

## 2024-03-04 ENCOUNTER — Encounter: Payer: Self-pay | Admitting: Cardiology

## 2024-03-04 VITALS — BP 128/64 | HR 84 | Ht 59.0 in | Wt 207.0 lb

## 2024-03-04 DIAGNOSIS — I10 Essential (primary) hypertension: Secondary | ICD-10-CM | POA: Diagnosis not present

## 2024-03-04 DIAGNOSIS — E782 Mixed hyperlipidemia: Secondary | ICD-10-CM | POA: Insufficient documentation

## 2024-03-04 DIAGNOSIS — Z131 Encounter for screening for diabetes mellitus: Secondary | ICD-10-CM

## 2024-03-04 DIAGNOSIS — Z1329 Encounter for screening for other suspected endocrine disorder: Secondary | ICD-10-CM

## 2024-03-04 DIAGNOSIS — Z1211 Encounter for screening for malignant neoplasm of colon: Secondary | ICD-10-CM

## 2024-03-04 DIAGNOSIS — F4310 Post-traumatic stress disorder, unspecified: Secondary | ICD-10-CM

## 2024-03-04 DIAGNOSIS — N926 Irregular menstruation, unspecified: Secondary | ICD-10-CM | POA: Diagnosis not present

## 2024-03-04 DIAGNOSIS — Z13220 Encounter for screening for lipoid disorders: Secondary | ICD-10-CM

## 2024-03-04 DIAGNOSIS — E559 Vitamin D deficiency, unspecified: Secondary | ICD-10-CM | POA: Diagnosis not present

## 2024-03-04 LAB — POCT URINE PREGNANCY: Preg Test, Ur: NEGATIVE

## 2024-03-04 MED ORDER — LORATADINE 10 MG PO TABS: 10.0000 mg | ORAL_TABLET | Freq: Every day | ORAL | 1 refills | Status: AC

## 2024-03-04 MED ORDER — ASPIRIN LOW DOSE 81 MG PO CHEW
81.0000 mg | CHEWABLE_TABLET | Freq: Every day | ORAL | 1 refills | Status: AC
Start: 2024-03-04 — End: ?

## 2024-03-04 MED ORDER — HYDROCHLOROTHIAZIDE 25 MG PO TABS: 25.0000 mg | ORAL_TABLET | Freq: Every day | ORAL | 1 refills | Status: DC

## 2024-03-04 MED ORDER — ROSUVASTATIN CALCIUM 20 MG PO TABS: 20.0000 mg | ORAL_TABLET | Freq: Every day | ORAL | 1 refills | Status: DC

## 2024-03-04 MED ORDER — CITALOPRAM HYDROBROMIDE 10 MG PO TABS: 10.0000 mg | ORAL_TABLET | Freq: Every day | ORAL | 1 refills | Status: DC

## 2024-03-04 MED ORDER — BACLOFEN 10 MG PO TABS: 10.0000 mg | ORAL_TABLET | Freq: Every day | ORAL | 1 refills | Status: DC

## 2024-03-04 NOTE — Progress Notes (Signed)
 Established Patient Office Visit  Subjective:  Patient ID: Alexandra Montes, female    DOB: 30-Aug-1976  Age: 48 y.o. MRN: [id number]  Chief Complaint  Patient presents with   Medication Refill    Medication Refills    Patient in office for over due follow up. Patient last seen 12/2021. Patient had moved to [facility], recently moved back. Patient has no new complaints today. Needs refills. Over due for colonoscopy, referral sent.  Patient reports having a mammogram in [place] last year.  Will get fasting lab work today.  Patient reports tizanidine not working well. Will change to baclofen.     No other concerns at this time.   Past Medical History:  Diagnosis Date   Anxiety    Carpal tunnel syndrome    Cauliflower ear, right    DVT of deep femoral vein, left (HCC)    GERD (gastroesophageal reflux disease)    Headache    Hypertension    Ovarian cyst    Right    Past Surgical History:  Procedure Laterality Date   AMPUTATION FINGER     AMPUTATION TOE     NO PAST SURGERIES      Social History   Socioeconomic History   Marital status: Divorced    Spouse name: Not on file   Number of children: 0   Years of education: Not on file   Highest education level: GED or equivalent  Occupational History   Not on file  Tobacco Use   Smoking status: Never   Smokeless tobacco: Never  Vaping Use   Vaping status: Never Used  Substance and Sexual Activity   Alcohol use: No   Drug use: No   Sexual activity: Yes    Birth control/protection: None  Other Topics Concern   Not on file  Social History Narrative   Not on file   Social Drivers of Health   Financial Resource Strain: Medium Risk (11/20/2017)   Overall Financial Resource Strain (CARDIA)    Difficulty of Paying Living Expenses: Somewhat hard  Food Insecurity: No Food Insecurity (02/29/2024)   Hunger Vital Sign    Worried About Running Out of Food in the Last Year: Never true    Ran Out of Food in the Last Year: Never true   Transportation Needs: No Transportation Needs (02/29/2024)   PRAPARE - Administrator, Civil Service (Medical): No    Lack of Transportation (Non-Medical): No  Physical Activity: Inactive (11/20/2017)   Exercise Vital Sign    Days of Exercise per Week: 0 days    Minutes of Exercise per Session: 0 min  Stress: Not on file  Social Connections: Socially Isolated (02/29/2024)   Social Connection and Isolation Panel [NHANES]    Frequency of Communication with Friends and Family: More than three times a week    Frequency of Social Gatherings with Friends and Family: More than three times a week    Attends Religious Services: Never    Database administrator or Organizations: No    Attends Banker Meetings: Never    Marital Status: Separated  Intimate Partner Violence: Not At Risk (02/29/2024)   Humiliation, Afraid, Rape, and Kick questionnaire    Fear of Current or Ex-Partner: No    Emotionally Abused: No    Physically Abused: No    Sexually Abused: No    Family History  Problem Relation Age of Onset   Heart disease Mother    Bipolar disorder Mother  Drug abuse Mother    Alcohol abuse Mother    Breast cancer Mother 84   Schizophrenia Father    Lung cancer Maternal Grandfather     No Known Allergies  Outpatient Medications Prior to Visit  Medication Sig   acetaminophen (TYLENOL) 325 MG tablet Take 650 mg by mouth 4 (four) times daily.   albuterol (VENTOLIN HFA) 108 (90 Base) MCG/ACT inhaler ProAir HFA 108 (90 Base) MCG/ACT Inhalation Aerosol Solution QTY: 1 inhaler Days: 30 Refills: 1  Written: 02/27/18 Patient Instructions: Inhale 1 puff orally every 6 hours as needed for shortness of breath   ergocalciferol (VITAMIN D2) 1.25 MG (50000 UT) capsule Ergocalciferol 1.25 MG (50000 UT) Oral Capsule QTY: 12 capsule Days: 84 Refills: 3  Written: 03/14/19 Patient Instructions: Take 1 capsule by mouth WEEKLY   fluticasone (FLONASE) 50 MCG/ACT nasal spray  Fluticasone Propionate 50 MCG/ACT Nasal Suspension QTY: 1 bottle Days: 30 Refills: 4  Written: 02/19/18 Patient Instructions: Lean head forward, instill 1 spray in each nostril daily with a gentle sniff   gabapentin (NEURONTIN) 100 MG capsule Take 100 mg by mouth 2 (two) times daily as needed (Nerve Pain).   potassium chloride (KLOR-CON M) 10 MEQ tablet Take 1 tablet (10 mEq total) by mouth 2 (two) times daily.   SUMAtriptan (IMITREX) 50 MG tablet Take 50 mg by mouth 2 (two) times daily as needed.   triamcinolone (KENALOG) 0.025 % cream Apply 1 Application topically daily as needed (rash).   [DISCONTINUED] ASPIRIN LOW DOSE 81 MG chewable tablet Chew 81 mg by mouth daily.   [DISCONTINUED] citalopram (CELEXA) 10 MG tablet Take 1 tablet (10 mg total) by mouth daily.   [DISCONTINUED] hydrochlorothiazide (HYDRODIURIL) 25 MG tablet Take 25 mg by mouth daily.   [DISCONTINUED] loratadine (CLARITIN) 10 MG tablet TAKE 1 TABLET BY MOUTH ONCE DAILY AS NEEDED FOR SEASONAL ALLERGY SYMPTOMS.   [DISCONTINUED] rosuvastatin (CRESTOR) 20 MG tablet Take 20 mg by mouth daily.   [DISCONTINUED] cyclobenzaprine (FLEXERIL) 10 MG tablet Take 10 mg by mouth 3 (three) times daily as needed. (Patient not taking: Reported on 03/04/2024)   No facility-administered medications prior to visit.    Review of Systems  Constitutional: Negative.   HENT: Negative.    Eyes: Negative.   Respiratory: Negative.  Negative for shortness of breath.   Cardiovascular: Negative.  Negative for chest pain.  Gastrointestinal: Negative.  Negative for abdominal pain, constipation and diarrhea.  Genitourinary: Negative.   Musculoskeletal:  Negative for joint pain and myalgias.  Skin: Negative.   Neurological: Negative.  Negative for dizziness and headaches.  Endo/Heme/Allergies: Negative.   All other systems reviewed and are negative.      Objective:   BP 128/64   Pulse 84   Ht 4\' 11"  (1.499 m)   Wt 207 lb (93.9 kg)   SpO2 97%   BMI  41.81 kg/m   Vitals:   03/04/24 0926  BP: 128/64  Pulse: 84  Height: 4\' 11"  (1.499 m)  Weight: 207 lb (93.9 kg)  SpO2: 97%  BMI (Calculated): 41.79    Physical Exam Vitals and nursing note reviewed.  Constitutional:      Appearance: Normal appearance. She is normal weight.  HENT:     Head: Normocephalic and atraumatic.     Nose: Nose normal.     Mouth/Throat:     Mouth: Mucous membranes are moist.  Eyes:     Extraocular Movements: Extraocular movements intact.     Conjunctiva/sclera: Conjunctivae normal.  Pupils: Pupils are equal, round, and reactive to light.  Cardiovascular:     Rate and Rhythm: Normal rate and regular rhythm.     Pulses: Normal pulses.     Heart sounds: Normal heart sounds.  Pulmonary:     Effort: Pulmonary effort is normal.     Breath sounds: Normal breath sounds.  Abdominal:     General: Abdomen is flat. Bowel sounds are normal.     Palpations: Abdomen is soft.  Musculoskeletal:        General: Normal range of motion.     Cervical back: Normal range of motion.  Skin:    General: Skin is warm and dry.  Neurological:     General: No focal deficit present.     Mental Status: She is alert and oriented to person, place, and time.  Psychiatric:        Mood and Affect: Mood normal.        Behavior: Behavior normal.        Thought Content: Thought content normal.        Judgment: Judgment normal.      Results for orders placed or performed in visit on 03/04/24  POCT Urine Pregnancy  Result Value Ref Range   Preg Test, Ur Negative Negative    Recent Results (from the past 2160 hours)  Lipase, blood     Status: None   Collection Time: 02/27/24  4:15 AM  Result Value Ref Range   Lipase 28 11 - 51 U/L    Comment: Performed at [facility], [address], [place], [place] [address]  Comprehensive metabolic panel     Status: Abnormal   Collection Time: 02/27/24  4:15 AM  Result Value Ref Range   Sodium 138 135 - 145 mmol/L    Potassium 2.7 (LL) 3.5 - 5.1 mmol/L    Comment: CRITICAL RESULT CALLED TO, READ BACK BY AND VERIFIED WITH LISA THOMPSON RN [phone] 02/27/24 HNM    Chloride 104 98 - 111 mmol/L   CO2 23 22 - 32 mmol/L   Glucose, Bld 131 (H) 70 - 99 mg/dL    Comment: Glucose reference range applies only to samples taken after fasting for at least 8 hours.   BUN 21 (H) 6 - 20 mg/dL   Creatinine, Ser 4.09 0.44 - 1.00 mg/dL   Calcium 8.8 (L) 8.9 - 10.3 mg/dL   Total Protein 7.6 6.5 - 8.1 g/dL   Albumin 3.4 (L) 3.5 - 5.0 g/dL   AST 31 15 - 41 U/L   ALT 28 0 - 44 U/L   Alkaline Phosphatase 69 38 - 126 U/L   Total Bilirubin 0.7 0.0 - 1.2 mg/dL   GFR, Estimated >81 >19 mL/min    Comment: (NOTE) Calculated using the CKD-EPI Creatinine Equation (2021)    Anion gap 11 5 - 15    Comment: Performed at [facility], [address] [place], [place] [address]  CBC     Status: Abnormal   Collection Time: 02/27/24  4:15 AM  Result Value Ref Range   WBC 6.3 4.0 - 10.5 K/uL   RBC 4.08 3.87 - 5.11 MIL/uL   Hemoglobin 11.9 (L) 12.0 - 15.0 g/dL   HCT 95.6 21.3 - 08.6 %   MCV 88.2 80.0 - 100.0 fL   MCH 29.2 26.0 - 34.0 pg   MCHC 33.1 30.0 - 36.0 g/dL   RDW 57.8 46.9 - 62.9 %   Platelets 207 150 - 400 K/uL  nRBC 0.0 0.0 - 0.2 %    Comment: Performed at [facility], [address] [place], [place] [address]  CBC with Differential     Status: Abnormal   Collection Time: 02/29/24  4:19 AM  Result Value Ref Range   WBC 7.2 4.0 - 10.5 K/uL   RBC 4.08 3.87 - 5.11 MIL/uL   Hemoglobin 11.8 (L) 12.0 - 15.0 g/dL   HCT 60.4 (L) 54.0 - 98.1 %   MCV 88.0 80.0 - 100.0 fL   MCH 28.9 26.0 - 34.0 pg   MCHC 32.9 30.0 - 36.0 g/dL   RDW 19.1 47.8 - 29.5 %   Platelets 209 150 - 400 K/uL   nRBC 0.0 0.0 - 0.2 %   Neutrophils Relative % 49 %   Neutro Abs 3.6 1.7 - 7.7 K/uL   Lymphocytes Relative 38 %   Lymphs Abs 2.7 0.7 - 4.0 K/uL   Monocytes Relative 11 %   Monocytes Absolute 0.8 0.1 - 1.0 K/uL    Eosinophils Relative 1 %   Eosinophils Absolute 0.0 0.0 - 0.5 K/uL   Basophils Relative 1 %   Basophils Absolute 0.1 0.0 - 0.1 K/uL   Immature Granulocytes 0 %   Abs Immature Granulocytes 0.02 0.00 - 0.07 K/uL    Comment: Performed at [facility], [address] [place], [place] [address]  Comprehensive metabolic panel     Status: Abnormal   Collection Time: 02/29/24  4:19 AM  Result Value Ref Range   Sodium 136 135 - 145 mmol/L   Potassium 2.6 (LL) 3.5 - 5.1 mmol/L    Comment: CRITICAL RESULT CALLED TO, READ BACK BY AND VERIFIED WITH HUNTER ORE 02/29/24 0456 MW    Chloride 100 98 - 111 mmol/L   CO2 23 22 - 32 mmol/L   Glucose, Bld 126 (H) 70 - 99 mg/dL    Comment: Glucose reference range applies only to samples taken after fasting for at least 8 hours.   BUN 20 6 - 20 mg/dL   Creatinine, Ser 8.65 0.44 - 1.00 mg/dL   Calcium 9.1 8.9 - 78.4 mg/dL   Total Protein 8.2 (H) 6.5 - 8.1 g/dL   Albumin 3.9 3.5 - 5.0 g/dL   AST 28 15 - 41 U/L   ALT 30 0 - 44 U/L   Alkaline Phosphatase 69 38 - 126 U/L   Total Bilirubin 1.2 0.0 - 1.2 mg/dL   GFR, Estimated >69 >62 mL/min    Comment: (NOTE) Calculated using the CKD-EPI Creatinine Equation (2021)    Anion gap 13 5 - 15    Comment: Performed at [facility], [address] [place], [place] [address]  Troponin I (High Sensitivity)     Status: None   Collection Time: 02/29/24  4:19 AM  Result Value Ref Range   Troponin I (High Sensitivity) 10 <18 ng/L    Comment: (NOTE) Elevated high sensitivity troponin I (hsTnI) values and significant  changes across serial measurements may suggest ACS but many other  chronic and acute conditions are known to elevate hsTnI results.  Refer to the "Links" section for chest pain algorithms and additional  guidance. Performed at [facility], [address] [place], [place] [address]   Magnesium     Status: None   Collection Time: 02/29/24  4:19 AM  Result Value Ref  Range   Magnesium 2.3 1.7 - 2.4 mg/dL    Comment: Performed at [facility], [address] [place], [place] [address]  Troponin I (High Sensitivity)     Status: None   Collection Time: 02/29/24  6:05 AM  Result Value Ref Range   Troponin I (High Sensitivity) 8 <18 ng/L    Comment: (NOTE) Elevated high sensitivity troponin I (hsTnI) values and significant  changes across serial measurements may suggest ACS but many other  chronic and acute conditions are known to elevate hsTnI results.  Refer to the "Links" section for chest pain algorithms and additional  guidance. Performed at [facility], [address] [place], [place] [address]   Urine Drug Screen, Qualitative [facility] only)     Status: Abnormal   Collection Time: 02/29/24  7:51 AM  Result Value Ref Range   Tricyclic, Ur Screen POSITIVE (A) NONE DETECTED   Amphetamines, Ur Screen NONE DETECTED NONE DETECTED   MDMA (Ecstasy)Ur Screen NONE DETECTED NONE DETECTED   Cocaine Metabolite,Ur [place] NONE DETECTED NONE DETECTED   Opiate, Ur Screen NONE DETECTED NONE DETECTED   Phencyclidine (PCP) Ur S NONE DETECTED NONE DETECTED   Cannabinoid 50 Ng, Ur [place] NONE DETECTED NONE DETECTED   Barbiturates, Ur Screen NONE DETECTED NONE DETECTED   Benzodiazepine, Ur Scrn NONE DETECTED NONE DETECTED   Methadone Scn, Ur NONE DETECTED NONE DETECTED    Comment: (NOTE) Tricyclics + metabolites, urine    Cutoff 1000 ng/mL Amphetamines + metabolites, urine  Cutoff 1000 ng/mL MDMA (Ecstasy), urine              Cutoff 500 ng/mL Cocaine Metabolite, urine          Cutoff 300 ng/mL Opiate + metabolites, urine        Cutoff 300 ng/mL Phencyclidine (PCP), urine         Cutoff 25 ng/mL Cannabinoid, urine                 Cutoff 50 ng/mL Barbiturates + metabolites, urine  Cutoff 200 ng/mL Benzodiazepine, urine              Cutoff 200 ng/mL Methadone, urine                   Cutoff 300 ng/mL  The urine drug screen provides only a preliminary,  unconfirmed analytical test result and should not be used for non-medical purposes. Clinical consideration and professional judgment should be applied to any positive drug screen result due to possible interfering substances. A more specific alternate chemical method must be used in order to obtain a confirmed analytical result. Gas chromatography / mass spectrometry (GC/MS) is the preferred confirm atory method. Performed at [facility], [address] [place], [place] [address]   HIV Antibody (routine testing w rflx)     Status: None   Collection Time: 02/29/24  7:51 AM  Result Value Ref Range   HIV Screen 4th Generation wRfx Non Reactive Non Reactive    Comment: Performed at [facility], [address], [place], [place] [address]  D-dimer, quantitative     Status: None   Collection Time: 02/29/24  7:51 AM  Result Value Ref Range   D-Dimer, Quant 0.34 0.00 - 0.50 ug/mL-FEU    Comment: (NOTE) At the manufacturer cut-off value of 0.5 g/mL FEU, this assay has a negative predictive value of 95-100%.This assay is intended for use in conjunction with a clinical pretest probability (PTP) assessment model to exclude pulmonary embolism (PE) and deep venous thrombosis (DVT) in outpatients suspected of PE or DVT. Results should be  correlated with clinical presentation. Performed at [facility], [address] [place], [place] [address]   Basic metabolic panel     Status: Abnormal   Collection Time: 02/29/24  3:40 PM  Result Value Ref Range   Sodium 139 135 - 145 mmol/L   Potassium 3.3 (L) 3.5 - 5.1 mmol/L   Chloride 106 98 - 111 mmol/L   CO2 26 22 - 32 mmol/L   Glucose, Bld 79 70 - 99 mg/dL    Comment: Glucose reference range applies only to samples taken after fasting for at least 8 hours.   BUN 10 6 - 20 mg/dL   Creatinine, Ser 1.91 0.44 - 1.00 mg/dL   Calcium 8.4 (L) 8.9 - 10.3 mg/dL   GFR, Estimated >47 >82 mL/min    Comment: (NOTE) Calculated  using the CKD-EPI Creatinine Equation (2021)    Anion gap 7 5 - 15    Comment: Performed at [facility], [address] [place], [place] [address]  CBC     Status: Abnormal   Collection Time: 03/01/24  5:07 AM  Result Value Ref Range   WBC 4.8 4.0 - 10.5 K/uL   RBC 3.82 (L) 3.87 - 5.11 MIL/uL   Hemoglobin 11.2 (L) 12.0 - 15.0 g/dL   HCT 30.8 (L) 65.7 - 84.6 %   MCV 89.0 80.0 - 100.0 fL   MCH 29.3 26.0 - 34.0 pg   MCHC 32.9 30.0 - 36.0 g/dL   RDW 96.2 95.2 - 84.1 %   Platelets 193 150 - 400 K/uL   nRBC 0.0 0.0 - 0.2 %    Comment: Performed at [facility], [address] [place], [place] [address]  Comprehensive metabolic panel     Status: Abnormal   Collection Time: 03/01/24  5:07 AM  Result Value Ref Range   Sodium 140 135 - 145 mmol/L   Potassium 3.8 3.5 - 5.1 mmol/L   Chloride 107 98 - 111 mmol/L   CO2 27 22 - 32 mmol/L   Glucose, Bld 107 (H) 70 - 99 mg/dL    Comment: Glucose reference range applies only to samples taken after fasting for at least 8 hours.   BUN 10 6 - 20 mg/dL   Creatinine, Ser 1.02 0.44 - 1.00 mg/dL   Calcium 8.7 (L) 8.9 - 10.3 mg/dL   Total Protein 7.0 6.5 - 8.1 g/dL   Albumin 3.2 (L) 3.5 - 5.0 g/dL   AST 20 15 - 41 U/L   ALT 23 0 - 44 U/L   Alkaline Phosphatase 57 38 - 126 U/L   Total Bilirubin 0.8 0.0 - 1.2 mg/dL   GFR, Estimated >72 >53 mL/min    Comment: (NOTE) Calculated using the CKD-EPI Creatinine Equation (2021)    Anion gap 6 5 - 15    Comment: Performed at [facility], [address] [place], [place] [address]  POCT Urine Pregnancy     Status: Normal   Collection Time: 03/04/24  9:37 AM  Result Value Ref Range   Preg Test, Ur Negative Negative      Assessment & Plan:  Referral for colonoscopy sent.  Fasting lab work today. Medications refilled.  Change tizanidine to baclofen.   Problem List Items Addressed This Visit       Cardiovascular and Mediastinum   Essential hypertension - Primary    Relevant Medications   rosuvastatin (CRESTOR) 20 MG tablet   hydrochlorothiazide (HYDRODIURIL) 25 MG tablet   ASPIRIN LOW DOSE  81 MG chewable tablet     Other   Vitamin D deficiency   Relevant Orders   Vitamin D (25 hydroxy)   Mixed hyperlipidemia   Relevant Medications   rosuvastatin (CRESTOR) 20 MG tablet   hydrochlorothiazide (HYDRODIURIL) 25 MG tablet   ASPIRIN LOW DOSE 81 MG chewable tablet   Other Visit Diagnoses       Late menstruation       Relevant Orders   POCT Urine Pregnancy (Completed)     Diabetes mellitus screening       Relevant Orders   CMP14+EGFR   Hemoglobin A1c     Thyroid disorder screening       Relevant Orders   TSH     Lipid screening       Relevant Orders   Lipid Profile     PTSD (post-traumatic stress disorder)       Relevant Medications   citalopram (CELEXA) 10 MG tablet     Colon cancer screening       Relevant Orders   Ambulatory referral to Gastroenterology       Return in about 3 months (around 06/03/2024).   Total time spent: 25 minutes  [employer], NP  03/04/2024   This document may have been prepared by Dragon Voice Recognition software and as such may include unintentional dictation errors.

## 2024-03-05 LAB — HEMOGLOBIN A1C
Est. average glucose Bld gHb Est-mCnc: 91 mg/dL
Hgb A1c MFr Bld: 4.8 % (ref 4.8–5.6)

## 2024-03-05 LAB — TSH: TSH: 2.31 u[IU]/mL (ref 0.450–4.500)

## 2024-03-05 LAB — CMP14+EGFR
ALT: 18 IU/L (ref 0–32)
AST: 13 IU/L (ref 0–40)
Albumin: 3.9 g/dL (ref 3.9–4.9)
Alkaline Phosphatase: 81 IU/L (ref 44–121)
BUN/Creatinine Ratio: 27 — ABNORMAL HIGH (ref 9–23)
BUN: 13 mg/dL (ref 6–24)
Bilirubin Total: 0.4 mg/dL (ref 0.0–1.2)
CO2: 25 mmol/L (ref 20–29)
Calcium: 8.6 mg/dL — ABNORMAL LOW (ref 8.7–10.2)
Chloride: 104 mmol/L (ref 96–106)
Creatinine, Ser: 0.49 mg/dL — ABNORMAL LOW (ref 0.57–1.00)
Globulin, Total: 2.8 g/dL (ref 1.5–4.5)
Glucose: 81 mg/dL (ref 70–99)
Potassium: 4.3 mmol/L (ref 3.5–5.2)
Sodium: 142 mmol/L (ref 134–144)
Total Protein: 6.7 g/dL (ref 6.0–8.5)
eGFR: 115 mL/min/{1.73_m2} (ref >59)

## 2024-03-05 LAB — LIPID PANEL
Chol/HDL Ratio: 2.7 ratio (ref 0.0–4.4)
Cholesterol, Total: 112 mg/dL (ref 100–199)
HDL: 41 mg/dL (ref >39)
LDL Chol Calc (NIH): 59 mg/dL (ref 0–99)
Triglycerides: 52 mg/dL (ref 0–149)
VLDL Cholesterol Cal: 12 mg/dL (ref 5–40)

## 2024-03-05 LAB — VITAMIN D 25 HYDROXY (VIT D DEFICIENCY, FRACTURES): Vit D, 25-Hydroxy: 50.9 ng/mL (ref 30.0–100.0)

## 2024-03-10 ENCOUNTER — Observation Stay
Admission: EM | Admit: 2024-03-10 | Discharge: 2024-03-11 | Disposition: A | Discharge status: Home / Self Care | Attending: Family Medicine | Admitting: Family Medicine

## 2024-03-10 ENCOUNTER — Other Ambulatory Visit: Payer: Self-pay

## 2024-03-10 ENCOUNTER — Encounter: Payer: Self-pay | Admitting: Emergency Medicine

## 2024-03-10 DIAGNOSIS — Z7982 Long term (current) use of aspirin: Secondary | ICD-10-CM | POA: Insufficient documentation

## 2024-03-10 DIAGNOSIS — F419 Anxiety disorder, unspecified: Secondary | ICD-10-CM | POA: Diagnosis not present

## 2024-03-10 DIAGNOSIS — R519 Headache, unspecified: Secondary | ICD-10-CM | POA: Diagnosis present

## 2024-03-10 DIAGNOSIS — E876 Hypokalemia: Principal | ICD-10-CM | POA: Diagnosis present

## 2024-03-10 DIAGNOSIS — Z79899 Other long term (current) drug therapy: Secondary | ICD-10-CM | POA: Diagnosis not present

## 2024-03-10 DIAGNOSIS — Z86718 Personal history of other venous thrombosis and embolism: Secondary | ICD-10-CM | POA: Diagnosis not present

## 2024-03-10 DIAGNOSIS — G8929 Other chronic pain: Secondary | ICD-10-CM | POA: Diagnosis not present

## 2024-03-10 DIAGNOSIS — M545 Low back pain, unspecified: Secondary | ICD-10-CM | POA: Insufficient documentation

## 2024-03-10 DIAGNOSIS — G43909 Migraine, unspecified, not intractable, without status migrainosus: Secondary | ICD-10-CM | POA: Diagnosis not present

## 2024-03-10 DIAGNOSIS — Z6841 Body Mass Index (BMI) 40.0 and over, adult: Secondary | ICD-10-CM | POA: Insufficient documentation

## 2024-03-10 LAB — CBC
HCT: 34 % — ABNORMAL LOW (ref 36.0–46.0)
Hemoglobin: 11.4 g/dL — ABNORMAL LOW (ref 12.0–15.0)
MCH: 29.4 pg (ref 26.0–34.0)
MCHC: 33.5 g/dL (ref 30.0–36.0)
MCV: 87.6 fL (ref 80.0–100.0)
Platelets: 218 10*3/uL (ref 150–400)
RBC: 3.88 MIL/uL (ref 3.87–5.11)
RDW: 13.2 % (ref 11.5–15.5)
WBC: 7.5 10*3/uL (ref 4.0–10.5)
nRBC: 0 % (ref 0.0–0.2)

## 2024-03-10 LAB — HEPATIC FUNCTION PANEL
ALT: 18 U/L (ref 0–44)
AST: 21 U/L (ref 15–41)
Albumin: 3.6 g/dL (ref 3.5–5.0)
Alkaline Phosphatase: 61 U/L (ref 38–126)
Bilirubin, Direct: <0.1 mg/dL (ref 0.0–0.2)
Total Bilirubin: 0.8 mg/dL (ref 0.0–1.2)
Total Protein: 7.7 g/dL (ref 6.5–8.1)

## 2024-03-10 LAB — BASIC METABOLIC PANEL WITH GFR
Anion gap: 13 (ref 5–15)
BUN: 15 mg/dL (ref 6–20)
CO2: 23 mmol/L (ref 22–32)
Calcium: 8.9 mg/dL (ref 8.9–10.3)
Chloride: 100 mmol/L (ref 98–111)
Creatinine, Ser: 0.64 mg/dL (ref 0.44–1.00)
GFR, Estimated: >60 mL/min (ref >60)
Glucose, Bld: 118 mg/dL — ABNORMAL HIGH (ref 70–99)
Potassium: 2.4 mmol/L — CL (ref 3.5–5.1)
Sodium: 136 mmol/L (ref 135–145)

## 2024-03-10 LAB — MAGNESIUM: Magnesium: 1.9 mg/dL (ref 1.7–2.4)

## 2024-03-10 MED ORDER — ALBUTEROL SULFATE (2.5 MG/3ML) 0.083% IN NEBU: 2.5000 mg | INHALATION_SOLUTION | RESPIRATORY_TRACT | Status: DC | PRN

## 2024-03-10 MED ORDER — ACETAMINOPHEN 650 MG RE SUPP: 650.0000 mg | Freq: Four times a day (QID) | RECTAL | Status: DC | PRN

## 2024-03-10 MED ORDER — BISACODYL 5 MG PO TBEC: 5.0000 mg | DELAYED_RELEASE_TABLET | Freq: Every day | ORAL | Status: DC | PRN

## 2024-03-10 MED ORDER — DIPHENHYDRAMINE HCL 50 MG/ML IJ SOLN
25.0000 mg | Freq: Once | INTRAMUSCULAR | Status: AC
  Administered 2024-03-10: 25 mg via INTRAVENOUS
  Filled 2024-03-10: qty 1

## 2024-03-10 MED ORDER — ENOXAPARIN SODIUM 40 MG/0.4ML IJ SOSY: 40.0000 mg | PREFILLED_SYRINGE | INTRAMUSCULAR | Status: DC

## 2024-03-10 MED ORDER — SODIUM CHLORIDE 0.9 % IV BOLUS
1000.0000 mL | Freq: Once | INTRAVENOUS | Status: AC
  Administered 2024-03-10: 1000 mL via INTRAVENOUS

## 2024-03-10 MED ORDER — PROCHLORPERAZINE EDISYLATE 10 MG/2ML IJ SOLN
10.0000 mg | Freq: Once | INTRAMUSCULAR | Status: AC
  Administered 2024-03-10: 10 mg via INTRAVENOUS
  Filled 2024-03-10: qty 2

## 2024-03-10 MED ORDER — KETOROLAC TROMETHAMINE 15 MG/ML IJ SOLN
15.0000 mg | Freq: Once | INTRAMUSCULAR | Status: AC
  Administered 2024-03-10: 15 mg via INTRAVENOUS
  Filled 2024-03-10: qty 1

## 2024-03-10 MED ORDER — POTASSIUM CHLORIDE 10 MEQ/100ML IV SOLN
10.0000 meq | INTRAVENOUS | Status: DC
  Administered 2024-03-10: 10 meq via INTRAVENOUS
  Filled 2024-03-10 (×2): qty 100

## 2024-03-10 MED ORDER — POLYETHYLENE GLYCOL 3350 17 G PO PACK
17.0000 g | PACK | Freq: Every day | ORAL | Status: DC
  Filled 2024-03-10: qty 1

## 2024-03-10 MED ORDER — ACETAMINOPHEN 325 MG PO TABS: 650.0000 mg | ORAL_TABLET | Freq: Four times a day (QID) | ORAL | Status: DC | PRN

## 2024-03-10 MED ORDER — POTASSIUM CHLORIDE 10 MEQ/100ML IV SOLN
10.0000 meq | INTRAVENOUS | Status: AC
  Administered 2024-03-10 – 2024-03-11 (×6): 10 meq via INTRAVENOUS
  Filled 2024-03-10 (×5): qty 100

## 2024-03-10 MED ORDER — BUTALBITAL-APAP-CAFFEINE 50-325-40 MG PO TABS: 1.0000 | ORAL_TABLET | Freq: Four times a day (QID) | ORAL | Status: DC | PRN

## 2024-03-10 NOTE — Progress Notes (Signed)
 PHARMACIST - PHYSICIAN COMMUNICATION  CONCERNING:  Enoxaparin  (Lovenox ) for DVT Prophylaxis    RECOMMENDATION: Patient was prescribed enoxaprin 40mg  q24 hours for VTE prophylaxis.   Filed Weights   03/10/24 2053  Weight: 97.1 kg (214 lb 1.1 oz)    Body mass index is 43.24 kg/m.  Estimated Creatinine Clearance: 86.1 mL/min (by C-G formula based on SCr of 0.64 mg/dL).   Based on [facility] policy patient is candidate for enoxaparin  0.5mg /kg TBW SQ every 24 hours based on BMI being >30.  DESCRIPTION: Pharmacy has adjusted enoxaparin  dose per [facility] policy.  Patient is now receiving enoxaparin  0.5 mg/kg every 24 hours   Coretta Dexter, PharmD, [facility] 03/10/2024 11:17 PM

## 2024-03-10 NOTE — ED Triage Notes (Addendum)
 To ER via EMS for report of sudden onset of headache and vomiting while in car. Patient with delayed responses. Right sided headache and nausea. Per patient headache actually began around 5p and took her headache medication with no relief so started driving to the hospital when pain got worse.  Negative stroke assessment for EMS.   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 Last episode 3 weeks ago.

## 2024-03-10 NOTE — H&P (Signed)
 History and Physical  Alexandra Montes:[id number] DOB: [birth year] DOA: 03/10/2024 PCP: Glendale Landmark, NP  Chief Complaint: Headache Historian: patient, boyfriend  HPI:  Alexandra Montes is a 48 y.o. female with a PMH significant for chronic hypokalemia, DVT, chronic pain syndrome, carpal tunnel syndrome, depression, HTN, migraines, PTSD, spondylosis, obesity, HLD. At baseline, they live independently and are self-sufficient with ADLs.  They presented from cookout restaurant to the ED on 03/10/2024 with acute onset right sided headache and nausea with vomiting similar to previous migraine episodes including one that she was seen here for approximately 10 days ago. Patient is not agreeable to providing history at this time due to her headache.  She is hiding in her blankets and saying that she is cold repetitively. History is provided by her boyfriend at the bedside.  He states that they ate cookout around 5 PM tonight and patient ate more than he did.  Within a few minutes of consuming the food she started to have a headache on right frontal location with light sensitivity and nausea.  She had several episodes of vomiting of food products with no blood.  She denies any abdominal pain, diarrhea.  She was in her normal state of health prior to this episode.  She is adherent with her home medications including the potassium that has been prescribed for her which is written on the bottle as 10 mill equivalents daily but boyfriend states she has been taking it twice a day.  He states that she drinks a lot of soda and does not drink any water.  In the ED, it was found that they had stable vital signs on room air.  Significant findings included: Na+ 136, K+ 2.4, glucose 118, creatinine 0.64.  Hemoglobin 11.4, WBC 7.5.  Hepatic function panel normal.  Magnesium  1.9.  They were initially treated with Benadryl , Toradol , Compazine , IV potassium and normal saline bolus.  Patient was admitted to medicine service  for further workup and management of intractable vomiting and hypokalemia as outlined in detail below.  Assessment/Plan Principal Problem:   Hypokalemia   Hypokalemia-acute on chronic.  Related to diet and vomiting episodes. Mg++ 1.9 - replete with IV as unable to tolerate p.o. - Replete magnesium  - BMP a.m.  Acute migraine-unresolved with Tylenol  - Trial with Fioricet  - Maintain quiet and dark room  HTN-Home medication includes HCTZ which can be exacerbating her hypokalemia.  Still awaiting home meds reconciliation but if she is taking this I would recommend stopping altogether and using alternative agent.  If persistently hypokalemic, spironolactone would be a good choice. - Continue antihypertensives once reconciliation complete.  Patient had large container of prescription medications in the room but stated self administration was different than what was prescribed on the bottles  Anxiety  PTSD- - Continue home citalopram   Past Medical History:  Diagnosis Date   Anxiety    Carpal tunnel syndrome    Cauliflower ear, right    DVT of deep femoral vein, left (HCC)    GERD (gastroesophageal reflux disease)    Headache    Hypertension    Ovarian cyst    Right    Past Surgical History:  Procedure Laterality Date   AMPUTATION FINGER     AMPUTATION TOE     NO PAST SURGERIES       reports that she has never smoked. She has never used smokeless tobacco. She reports that she does not drink alcohol and does not use drugs.  No Known Allergies  Family  History  Problem Relation Age of Onset   Heart disease Mother    Bipolar disorder Mother    Drug abuse Mother    Alcohol abuse Mother    Breast cancer Mother 83   Schizophrenia Father    Lung cancer Maternal Grandfather     Prior to Admission medications   Medication Sig Start Date End Date Taking? Authorizing Provider  acetaminophen  (TYLENOL ) 325 MG tablet Take 650 mg by mouth 4 (four) times daily.    [provider]  albuterol  (VENTOLIN  HFA) 108 (90 Base) MCG/ACT inhaler ProAir  HFA 108 (90 Base) MCG/ACT Inhalation Aerosol Solution QTY: 1 inhaler Days: 30 Refills: 1  Written: 02/27/18 Patient Instructions: Inhale 1 puff orally every 6 hours as needed for shortness of breath 02/27/18   [provider]  ASPIRIN  LOW DOSE 81 MG chewable tablet Chew 1 tablet (81 mg total) by mouth daily. 03/04/24   Scoggins, Amber, NP  baclofen  (LIORESAL ) 10 MG tablet Take 1 tablet (10 mg total) by mouth daily. 03/04/24 03/04/25  Scoggins, Amber, NP  citalopram  (CELEXA ) 10 MG tablet Take 1 tablet (10 mg total) by mouth daily. 03/04/24   Scoggins, Amber, NP  ergocalciferol  (VITAMIN D2) 1.25 MG (50000 UT) capsule Ergocalciferol  1.25 MG (50000 UT) Oral Capsule QTY: 12 capsule Days: 84 Refills: 3  Written: 03/14/19 Patient Instructions: Take 1 capsule by mouth WEEKLY 03/14/19   [provider]  fluticasone (FLONASE) 50 MCG/ACT nasal spray Fluticasone Propionate 50 MCG/ACT Nasal Suspension QTY: 1 bottle Days: 30 Refills: 4  Written: 02/19/18 Patient Instructions: Lean head forward, instill 1 spray in each nostril daily with a gentle sniff 02/19/18   [provider]  gabapentin  (NEURONTIN ) 100 MG capsule Take 100 mg by mouth 2 (two) times daily as needed (Nerve Pain). 12/25/23   [provider]  hydrochlorothiazide  (HYDRODIURIL ) 25 MG tablet Take 1 tablet (25 mg total) by mouth daily. 03/04/24   Scoggins, Amber, NP  loratadine  (CLARITIN ) 10 MG tablet Take 1 tablet (10 mg total) by mouth daily. 03/04/24   Scoggins, Amber, NP  potassium chloride  (KLOR-CON  M) 10 MEQ tablet Take 1 tablet (10 mEq total) by mouth 2 (two) times daily. 03/01/24   Aisha Hove, MD  rosuvastatin  (CRESTOR ) 20 MG tablet Take 1 tablet (20 mg total) by mouth daily. 03/04/24   Scoggins, Amber, NP  SUMAtriptan  (IMITREX ) 50 MG tablet Take 50 mg by mouth 2 (two) times daily as needed. 11/27/23   [provider]  triamcinolone  (KENALOG) 0.025 % cream Apply 1 Application topically daily as needed (rash). 11/27/23   [provider]   I have personally, briefly reviewed patient's prior medical records in [facility]  Objective: Blood pressure (!) 152/76, pulse 87, temperature 98 F (36.7 C), temperature source Oral, resp. rate 20, height 4\' 11"  (1.499 m), weight 97.1 kg, SpO2 100%.   Constitutional: Hunched up under several blankets and refuses full exam due to being cold.  Limited conversation. HEENT: lids and conjunctivae normal.  PERRLA Neck: normal, supple, no masses, no thyromegaly Respiratory: CTAB, no wheezing, no crackles. Normal respiratory effort. No accessory muscle use.  Cardiovascular: RRR, no murmurs / rubs / gallops. No extremity edema. 2+ pedal pulses. no clubbing / cyanosis.  Musculoskeletal: No joint deformity upper and lower extremities. Normal muscle tone.  Skin: dry, intact, normal color, normal temperature on exposed skin Neurologic: Alert and oriented x 3. Normal speech. Grossly non-focal exam. PERRL.  Able to follow commands Psychiatric: Abnormal affect.  Labs  on Admission: I have personally reviewed admission labs and imaging studies  CBC    Component Value Date/Time   WBC 7.5 03/10/2024 2112   RBC 3.88 03/10/2024 2112   HGB 11.4 (L) 03/10/2024 2112   HGB 12.8 10/25/2012 1330   HCT 34.0 (L) 03/10/2024 2112   HCT 39.3 10/25/2012 1330   PLT 218 03/10/2024 2112   PLT 175 10/25/2012 1330   MCV 87.6 03/10/2024 2112   MCV 90 10/25/2012 1330   MCH 29.4 03/10/2024 2112   MCHC 33.5 03/10/2024 2112   RDW 13.2 03/10/2024 2112   RDW 12.9 10/25/2012 1330   LYMPHSABS 2.7 02/29/2024 0419   MONOABS 0.8 02/29/2024 0419   EOSABS 0.0 02/29/2024 0419   BASOSABS 0.1 02/29/2024 0419   CMP     Component Value Date/Time   NA 136 03/10/2024 2112   NA 142 03/04/2024 0956   NA 138 10/25/2012 1330   K 2.4 (LL) 03/10/2024 2112   K 3.3 (L) 10/25/2012 1330   CL 100 03/10/2024 2112   CL  105 10/25/2012 1330   CO2 23 03/10/2024 2112   CO2 26 10/25/2012 1330   GLUCOSE 118 (H) 03/10/2024 2112   GLUCOSE 100 (H) 10/25/2012 1330   BUN 15 03/10/2024 2112   BUN 13 03/04/2024 0956   BUN 13 10/25/2012 1330   CREATININE 0.64 03/10/2024 2112   CREATININE 0.62 10/25/2012 1330   CALCIUM  8.9 03/10/2024 2112   CALCIUM  8.8 10/25/2012 1330   PROT 7.7 03/10/2024 2112   PROT 6.7 03/04/2024 0956   PROT 7.9 10/25/2012 1330   ALBUMIN 3.6 03/10/2024 2112   ALBUMIN 3.9 03/04/2024 0956   ALBUMIN 3.7 10/25/2012 1330   AST 21 03/10/2024 2112   AST 21 10/25/2012 1330   ALT 18 03/10/2024 2112   ALT 29 10/25/2012 1330   ALKPHOS 61 03/10/2024 2112   ALKPHOS 87 10/25/2012 1330   BILITOT 0.8 03/10/2024 2112   BILITOT 0.4 03/04/2024 0956   BILITOT 0.8 10/25/2012 1330   GFRNONAA >60 03/10/2024 2112   GFRNONAA >60 10/25/2012 1330   GFRAA >60 06/27/2020 0900   GFRAA >60 10/25/2012 1330    Radiological Exams on Admission: No results found.  EKG: Independently reviewed.  NSR, heart rate 84.  QTc 505  DVT prophylaxis: enoxaparin  (LOVENOX ) injection 40 mg Start: 03/10/24 2315   Code Status: Full Family Communication: Boyfriend at bedside Disposition Plan: Admit observation Consults called: None  Ree Candy, DO [facility]  03/10/2024, 11:10 PM    To contact the appropriate [facility] Attending or Consulting provider: Check [email or web address] for coverage from 7pm-7am
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
## 2024-03-11 DIAGNOSIS — E876 Hypokalemia: Secondary | ICD-10-CM | POA: Diagnosis not present

## 2024-03-11 LAB — BASIC METABOLIC PANEL WITH GFR
Anion gap: 8 (ref 5–15)
Anion gap: 6 (ref 5–15)
BUN: 9 mg/dL (ref 6–20)
BUN: 8 mg/dL (ref 6–20)
CO2: 24 mmol/L (ref 22–32)
CO2: 27 mmol/L (ref 22–32)
Calcium: 8.3 mg/dL — ABNORMAL LOW (ref 8.9–10.3)
Calcium: 8.6 mg/dL — ABNORMAL LOW (ref 8.9–10.3)
Chloride: 102 mmol/L (ref 98–111)
Chloride: 103 mmol/L (ref 98–111)
Creatinine, Ser: 0.56 mg/dL (ref 0.44–1.00)
Creatinine, Ser: 0.79 mg/dL (ref 0.44–1.00)
GFR, Estimated: >60 mL/min (ref >60)
GFR, Estimated: >60 mL/min (ref >60)
Glucose, Bld: 125 mg/dL — ABNORMAL HIGH (ref 70–99)
Glucose, Bld: 103 mg/dL — ABNORMAL HIGH (ref 70–99)
Potassium: 3.7 mmol/L (ref 3.5–5.1)
Potassium: 3.6 mmol/L (ref 3.5–5.1)
Sodium: 134 mmol/L — ABNORMAL LOW (ref 135–145)
Sodium: 136 mmol/L (ref 135–145)

## 2024-03-11 LAB — CBC
HCT: 35.7 % — ABNORMAL LOW (ref 36.0–46.0)
Hemoglobin: 11.6 g/dL — ABNORMAL LOW (ref 12.0–15.0)
MCH: 29.1 pg (ref 26.0–34.0)
MCHC: 32.5 g/dL (ref 30.0–36.0)
MCV: 89.5 fL (ref 80.0–100.0)
Platelets: 209 10*3/uL (ref 150–400)
RBC: 3.99 MIL/uL (ref 3.87–5.11)
RDW: 13 % (ref 11.5–15.5)
WBC: 6.8 10*3/uL (ref 4.0–10.5)
nRBC: 0 % (ref 0.0–0.2)

## 2024-03-11 LAB — CREATININE, SERUM
Creatinine, Ser: 0.59 mg/dL (ref 0.44–1.00)
GFR, Estimated: >60 mL/min (ref >60)

## 2024-03-11 MED ORDER — MAGNESIUM SULFATE 2 GM/50ML IV SOLN
2.0000 g | Freq: Once | INTRAVENOUS | Status: AC
  Administered 2024-03-11: 2 g via INTRAVENOUS
  Filled 2024-03-11: qty 50

## 2024-03-11 MED ORDER — BACLOFEN 10 MG PO TABS
10.0000 mg | ORAL_TABLET | Freq: Every day | ORAL | Status: DC
Start: 2024-03-11 — End: 2024-03-11
  Administered 2024-03-11: 10 mg via ORAL
  Filled 2024-03-11: qty 1

## 2024-03-11 MED ORDER — ASPIRIN 81 MG PO CHEW
81.0000 mg | CHEWABLE_TABLET | Freq: Every day | ORAL | Status: DC
  Administered 2024-03-11: 81 mg via ORAL
  Filled 2024-03-11: qty 1

## 2024-03-11 MED ORDER — SUMATRIPTAN SUCCINATE 50 MG PO TABS: 50.0000 mg | ORAL_TABLET | Freq: Two times a day (BID) | ORAL | Status: DC | PRN

## 2024-03-11 MED ORDER — CITALOPRAM HYDROBROMIDE 20 MG PO TABS
10.0000 mg | ORAL_TABLET | Freq: Every day | ORAL | Status: DC
  Administered 2024-03-11: 10 mg via ORAL
  Filled 2024-03-11: qty 1

## 2024-03-11 MED ORDER — GABAPENTIN 100 MG PO CAPS: 100.0000 mg | ORAL_CAPSULE | Freq: Two times a day (BID) | ORAL | Status: DC | PRN

## 2024-03-11 MED ORDER — ROSUVASTATIN CALCIUM 10 MG PO TABS
20.0000 mg | ORAL_TABLET | Freq: Every day | ORAL | Status: DC
  Administered 2024-03-11: 20 mg via ORAL
  Filled 2024-03-11: qty 2

## 2024-03-11 NOTE — Plan of Care (Signed)
  Problem: Pain Managment: Goal: General experience of comfort will improve and/or be controlled Outcome: Progressing   Problem: Safety: Goal: Ability to remain free from injury will improve Outcome: Progressing   Problem: Skin Integrity: Goal: Risk for impaired skin integrity will decrease Outcome: Progressing

## 2024-03-11 NOTE — Hospital Course (Addendum)
 Alexandra Montes is 48 y.o. female with chronic hypokalemia, prior DVT of AC, chronic pain syndrome, carpal tunnel syndrome, depression, hypertension, migraines, PTSD, spondylosis, obesity, hyperlipidemia, who presented after going to a restaurant and complained of acute right-sided headache, nausea, vomiting.  She reports this is similar to prior migraine episodes and was seen in this ED for similar onset 10 days prior.  In the ED labs revealed hypokalemia to 2.4, and were otherwise unremarkable.  QTC mildly prolonged at 505.  Patient was treated with Benadryl , Toradol , Compazine , IV potassium, NS bolus.  She was admitted for further workup. By reevaluation 4/21 patient had no vomiting or headache.  Potassium had resolved to normal.  QTc resolved.  Patient was anxious to discharge home.  She reveals that she has been taking hydrochlorothiazide  as it was prescribed by her outpatient nurse practitioner.  She reports she was prescribed this medication for lower extremity swelling (none present today).  She denies ever having a workup for the swelling including echocardiogram.    Discussed with her that HCTZ is likely the etiology of her hypokalemia and she should discontinue this medication.  If she requires an antihypertensive in the future with persistent hypokalemia, spironolactone would be a better choice.  I recommend she follow-up with a primary care physician to receive a workup for her lower extremity swelling.  She endorses understanding.

## 2024-03-11 NOTE — Progress Notes (Signed)
 PIV removed. AVS reviewed. Patient and significant other verbalized understanding of necessary medication changes and follow up appointments.

## 2024-03-11 NOTE — Discharge Summary (Signed)
 Physician Discharge Summary   Patient: Alexandra Montes MRN: [id number] DOB: [birth year]  Admit date:     03/10/2024  Discharge date: 03/11/24  Discharge Physician: Roise Cleaver   PCP: Glendale Landmark, NP   Recommendations at discharge:   Follow-up outpatient with primary care physician for repeat potassium level, lower extremity edema workup, and blood pressure monitoring.  Discharge Diagnoses: Principal Problem:   Hypokalemia  Resolved Problems:   * No resolved hospital problems. *  Hospital Course: Alexandra Montes is 48 y.o. female with chronic hypokalemia, prior DVT of AC, chronic pain syndrome, carpal tunnel syndrome, depression, hypertension, migraines, PTSD, spondylosis, obesity, hyperlipidemia, who presented after going to a restaurant and complained of acute right-sided headache, nausea, vomiting.  She reports this is similar to prior migraine episodes and was seen in this ED for similar onset 10 days prior.  In the ED labs revealed hypokalemia to 2.4, and were otherwise unremarkable.  QTC mildly prolonged at 505.  Patient was treated with Benadryl , Toradol , Compazine , IV potassium, NS bolus.  She was admitted for further workup. By reevaluation 4/21 patient had no vomiting or headache.  Potassium had resolved to normal.  QTc resolved.  Patient was anxious to discharge home.  She reveals that she has been taking hydrochlorothiazide  as it was prescribed by her outpatient nurse practitioner.  She reports she was prescribed this medication for lower extremity swelling (none present today).  She denies ever having a workup for the swelling including echocardiogram.    Discussed with her that HCTZ is likely the etiology of her hypokalemia and she should discontinue this medication.  If she requires an antihypertensive in the future with persistent hypokalemia, spironolactone would be a better choice.  I recommend she follow-up with a primary care physician to receive a workup for her lower  extremity swelling.  She endorses understanding.  Hypokalemia, resolved - Acute on chronic - Patient reports she takes 20 mEq potassium daily - Status post IV repletion.  Potassium much improved this morning. - Acute worsening likely secondary to vomiting and HCTZ use  Migraine - Resolved - Takes sumatriptan  at home, denies need for prescription at discharge   Hypertension - Home meds include HCTZ which is certainly exacerbating/causing her hypokalemia - Spironolactone would be a better agent in light of her chronic hypokalemia - Recommend follow-up for lower extremity edema, needs workup with echo   Anxiety - PTSD - Resume home meds   Chronic back pain - Resume home meds - Has follow-up with neurosurgery outpatient  BMI 43 - Outpatient follow up for lifestyle modification and risk factor management     Diet recommendation:  Discharge Diet Orders (From admission, onward)     Start     Ordered   03/11/24 0000  Diet general        03/11/24 1151            DISCHARGE MEDICATION: Allergies as of 03/11/2024   No Known Allergies      Medication List     TAKE these medications    acetaminophen  325 MG tablet Commonly known as: TYLENOL  Take 650 mg by mouth 4 (four) times daily.   albuterol  108 (90 Base) MCG/ACT inhaler Commonly known as: VENTOLIN  HFA ProAir  HFA 108 (90 Base) MCG/ACT Inhalation Aerosol Solution QTY: 1 inhaler Days: 30 Refills: 1  Written: 02/27/18 Patient Instructions: Inhale 1 puff orally every 6 hours as needed for shortness of breath   Aspirin  Low Dose 81 MG chewable tablet Generic drug: aspirin   Chew 1 tablet (81 mg total) by mouth daily.   baclofen  10 MG tablet Commonly known as: LIORESAL  Take 1 tablet (10 mg total) by mouth daily.   citalopram  10 MG tablet Commonly known as: CELEXA  Take 1 tablet (10 mg total) by mouth daily.   ergocalciferol  1.25 MG (50000 UT) capsule Commonly known as: VITAMIN D2 Ergocalciferol  1.25 MG (50000 UT)  Oral Capsule QTY: 12 capsule Days: 84 Refills: 3  Written: 03/14/19 Patient Instructions: Take 1 capsule by mouth WEEKLY   fluticasone 50 MCG/ACT nasal spray Commonly known as: FLONASE Fluticasone Propionate 50 MCG/ACT Nasal Suspension QTY: 1 bottle Days: 30 Refills: 4  Written: 02/19/18 Patient Instructions: Lean head forward, instill 1 spray in each nostril daily with a gentle sniff   gabapentin  100 MG capsule Commonly known as: NEURONTIN  Take 100 mg by mouth 2 (two) times daily as needed (Nerve Pain).   loratadine  10 MG tablet Commonly known as: CLARITIN  Take 1 tablet (10 mg total) by mouth daily.   potassium chloride  10 MEQ tablet Commonly known as: KLOR-CON  M Take 1 tablet (10 mEq total) by mouth 2 (two) times daily.   rosuvastatin  20 MG tablet Commonly known as: CRESTOR  Take 1 tablet (20 mg total) by mouth daily.   SUMAtriptan  50 MG tablet Commonly known as: IMITREX  Take 50 mg by mouth 2 (two) times daily as needed.   triamcinolone 0.025 % cream Commonly known as: KENALOG Apply 1 Application topically daily as needed (rash).        Discharge Exam: Filed Weights   03/10/24 2053  Weight: 97.1 kg   Constitutional:  Normal appearance. Non toxic-appearing.  HENT: Head Normocephalic and atraumatic.  Mucous membranes are moist.  Eyes:  Extraocular intact. Conjunctivae normal. Pupils are equal, round, and reactive to light.  Cardiovascular: Rate and Rhythm: Normal rate and regular rhythm.  Pulmonary: Non labored, symmetric rise of chest wall.  Musculoskeletal:  Normal range of motion.  Skin: warm and dry. not jaundiced.  Neurological: No focal deficit present. alert. Oriented. Psychiatric: Mood and Affect congruent.    Condition at discharge: stable  The results of significant diagnostics from this hospitalization (including imaging, microbiology, ancillary and laboratory) are listed below for reference.   Imaging Studies: CT Head Wo Contrast Result Date:  02/29/2024 CLINICAL DATA:  Headaches with increasing severity/frequency. EXAM: CT HEAD WITHOUT CONTRAST TECHNIQUE: Contiguous axial images were obtained from the base of the skull through the vertex without intravenous contrast. RADIATION DOSE REDUCTION: This exam was performed according to the departmental dose-optimization program which includes automated exposure control, adjustment of the mA and/or kV according to patient size and/or use of iterative reconstruction technique. COMPARISON:  None Available. FINDINGS: Brain: No evidence of acute infarction, hemorrhage, hydrocephalus, extra-axial collection or mass lesion/mass effect. Vascular: No hyperdense vessel or unexpected calcification. Skull: Normal. Negative for fracture or focal lesion. Sinuses/Orbits: No acute finding. Other: None. IMPRESSION: No acute intracranial abnormalities. Electronically Signed   By: Kimberley Penman M.D.   On: 02/29/2024 06:35   DG Chest 2 View Result Date: 02/29/2024 CLINICAL DATA:  Chest pain EXAM: CHEST - 2 VIEW COMPARISON:  None Available. FINDINGS: Normal heart size and mediastinal contours given low lung volumes. No acute infiltrate or edema. No effusion or pneumothorax. No acute osseous findings. IMPRESSION: No active cardiopulmonary disease. Electronically Signed   By: Ronnette Coke M.D.   On: 02/29/2024 05:36    Microbiology: Results for orders placed or performed during the hospital encounter of 07/24/18  Group A Strep  by PCR     Status: None   Collection Time: 07/24/18  9:54 PM   Specimen: Throat; Sterile Swab  Result Value Ref Range Status   Group A Strep by PCR NOT DETECTED NOT DETECTED Final    Comment: Performed at [facility], [address] [place], [place] [address]    Labs: CBC: Recent Labs  Lab 03/10/24 2112 03/11/24 0012  WBC 7.5 6.8  HGB 11.4* 11.6*  HCT 34.0* 35.7*  MCV 87.6 89.5  PLT 218 209   Basic Metabolic Panel: Recent Labs  Lab 03/10/24 2112 03/11/24 0012  03/11/24 0522 03/11/24 1105  NA 136  --  134* 136  K 2.4*  --  3.7 3.6  CL 100  --  102 103  CO2 23  --  24 27  GLUCOSE 118*  --  125* 103*  BUN 15  --  9 8  CREATININE 0.64 0.59 0.56 0.79  CALCIUM  8.9  --  8.3* 8.6*  MG 1.9  --   --   --    Liver Function Tests: Recent Labs  Lab 03/10/24 2112  AST 21  ALT 18  ALKPHOS 61  BILITOT 0.8  PROT 7.7  ALBUMIN 3.6   CBG: No results for input(s): "GLUCAP" in the last 168 hours.  Discharge time spent: 31 minutes.  Signed: Romelo Sciandra, DO [facility] 03/11/2024

## 2024-03-13 ENCOUNTER — Emergency Department

## 2024-03-13 ENCOUNTER — Emergency Department
Admission: EM | Admit: 2024-03-13 | Discharge: 2024-03-13 | Disposition: A | Discharge status: Home / Self Care | Attending: Emergency Medicine | Admitting: Emergency Medicine

## 2024-03-13 ENCOUNTER — Other Ambulatory Visit: Payer: Self-pay

## 2024-03-13 ENCOUNTER — Encounter: Payer: Self-pay | Admitting: Emergency Medicine

## 2024-03-13 DIAGNOSIS — R112 Nausea with vomiting, unspecified: Secondary | ICD-10-CM | POA: Diagnosis not present

## 2024-03-13 DIAGNOSIS — I10 Essential (primary) hypertension: Secondary | ICD-10-CM | POA: Insufficient documentation

## 2024-03-13 DIAGNOSIS — R519 Headache, unspecified: Secondary | ICD-10-CM | POA: Insufficient documentation

## 2024-03-13 DIAGNOSIS — Z7982 Long term (current) use of aspirin: Secondary | ICD-10-CM | POA: Insufficient documentation

## 2024-03-13 DIAGNOSIS — Z79899 Other long term (current) drug therapy: Secondary | ICD-10-CM | POA: Insufficient documentation

## 2024-03-13 LAB — CBC WITH DIFFERENTIAL/PLATELET
Abs Immature Granulocytes: 0.02 10*3/uL (ref 0.00–0.07)
Basophils Absolute: 0 10*3/uL (ref 0.0–0.1)
Basophils Relative: 1 %
Eosinophils Absolute: 0.1 10*3/uL (ref 0.0–0.5)
Eosinophils Relative: 1 %
HCT: 35.8 % — ABNORMAL LOW (ref 36.0–46.0)
Hemoglobin: 11.3 g/dL — ABNORMAL LOW (ref 12.0–15.0)
Immature Granulocytes: 0 %
Lymphocytes Relative: 44 %
Lymphs Abs: 2.9 10*3/uL (ref 0.7–4.0)
MCH: 29 pg (ref 26.0–34.0)
MCHC: 31.6 g/dL (ref 30.0–36.0)
MCV: 92 fL (ref 80.0–100.0)
Monocytes Absolute: 0.6 10*3/uL (ref 0.1–1.0)
Monocytes Relative: 9 %
Neutro Abs: 3 10*3/uL (ref 1.7–7.7)
Neutrophils Relative %: 45 %
Platelets: 229 10*3/uL (ref 150–400)
RBC: 3.89 MIL/uL (ref 3.87–5.11)
RDW: 13.6 % (ref 11.5–15.5)
WBC: 6.6 10*3/uL (ref 4.0–10.5)
nRBC: 0 % (ref 0.0–0.2)

## 2024-03-13 LAB — COMPREHENSIVE METABOLIC PANEL WITH GFR
ALT: 14 U/L (ref 0–44)
AST: 17 U/L (ref 15–41)
Albumin: 3.4 g/dL — ABNORMAL LOW (ref 3.5–5.0)
Alkaline Phosphatase: 56 U/L (ref 38–126)
Anion gap: 10 (ref 5–15)
BUN: 16 mg/dL (ref 6–20)
CO2: 21 mmol/L — ABNORMAL LOW (ref 22–32)
Calcium: 8.6 mg/dL — ABNORMAL LOW (ref 8.9–10.3)
Chloride: 104 mmol/L (ref 98–111)
Creatinine, Ser: 0.8 mg/dL (ref 0.44–1.00)
GFR, Estimated: >60 mL/min (ref >60)
Glucose, Bld: 117 mg/dL — ABNORMAL HIGH (ref 70–99)
Potassium: 3.3 mmol/L — ABNORMAL LOW (ref 3.5–5.1)
Sodium: 135 mmol/L (ref 135–145)
Total Bilirubin: 0.5 mg/dL (ref 0.0–1.2)
Total Protein: 7.3 g/dL (ref 6.5–8.1)

## 2024-03-13 LAB — MAGNESIUM: Magnesium: 2 mg/dL (ref 1.7–2.4)

## 2024-03-13 LAB — HCG, QUANTITATIVE, PREGNANCY: hCG, Beta Chain, Quant, S: <1 m[IU]/mL (ref <5)

## 2024-03-13 MED ORDER — BUTALBITAL-APAP-CAFFEINE 50-325-40 MG PO TABS: 1.0000 | ORAL_TABLET | Freq: Four times a day (QID) | ORAL | 0 refills | Status: AC | PRN

## 2024-03-13 MED ORDER — LORAZEPAM 2 MG/ML IJ SOLN
1.0000 mg | Freq: Once | INTRAMUSCULAR | Status: AC
  Administered 2024-03-13: 1 mg via INTRAVENOUS
  Filled 2024-03-13: qty 1

## 2024-03-13 MED ORDER — DEXAMETHASONE SODIUM PHOSPHATE 10 MG/ML IJ SOLN
10.0000 mg | Freq: Once | INTRAMUSCULAR | Status: AC
  Administered 2024-03-13: 10 mg via INTRAVENOUS
  Filled 2024-03-13: qty 1

## 2024-03-13 MED ORDER — METOCLOPRAMIDE HCL 5 MG/ML IJ SOLN
5.0000 mg | Freq: Once | INTRAMUSCULAR | Status: AC
  Administered 2024-03-13: 5 mg via INTRAVENOUS
  Filled 2024-03-13: qty 2

## 2024-03-13 MED ORDER — KETOROLAC TROMETHAMINE 30 MG/ML IJ SOLN
15.0000 mg | Freq: Once | INTRAMUSCULAR | Status: AC
  Administered 2024-03-13: 15 mg via INTRAVENOUS
  Filled 2024-03-13: qty 1

## 2024-03-13 NOTE — ED Triage Notes (Signed)
 Pt to triage via w/c with no distress noted; c/o generalized HA tonight with no accomp symptoms; st seen for same recently and dx with hypokalemia

## 2024-03-13 NOTE — ED Provider Notes (Signed)
 [facility] Provider Note    Event Date/Time   First MD Initiated Contact with Patient 03/13/24 [phone]     (approximate)   History   Headache   HPI  Alexandra Montes is a 48 y.o. female brought to the ED from home by her family member with a chief complaint of headache associated with nausea/vomiting.  Patient with a history of hypertension, headache syndrome, chronic pain syndrome, history of DVT, hypokalemia who reports headache x 1 day.  This is patient's fourth visit to the emergency department this month for same.  She was hospitalized on 4/10 and 03/10/2024 with severe hypokalemia as well as headaches.  She has Imitrex  at home.  Has never seen a neurologist or had workup for these headaches which have been ongoing for over 1 year.  Denies fever/chills, chest pain, shortness of breath, abdominal pain, dysuria or diarrhea.     Past Medical History   Past Medical History:  Diagnosis Date   Anxiety    Carpal tunnel syndrome    Cauliflower ear, right    DVT of deep femoral vein, left (HCC)    GERD (gastroesophageal reflux disease)    Headache    Hypertension    Ovarian cyst    Right     Active Problem List   Patient Active Problem List   Diagnosis Date Noted   Mixed hyperlipidemia 03/04/2024   Obesity, Class III, BMI 40-49.9 (morbid obesity) (HCC) 03/02/2024   Hypokalemia 02/29/2024   Chest pain 02/29/2024   Chronic radicular lumbar pain 09/29/2020   Lumbar spondylosis 09/29/2020   Chronic pain syndrome 09/29/2020   Spondylosis 07/03/2020   Other B-complex deficiencies 11/01/2019   Acute deep vein thrombosis (DVT) of femoral vein of left lower extremity (HCC) 06/29/2019   Pain of left lower extremity 06/29/2019   Pain in soft tissues of limb 06/29/2019   Post-traumatic stress disorder 11/20/2018   Allergic rhinitis 08/14/2018   Vitamin D  deficiency 04/09/2018   Depression 11/30/2017   Essential hypertension 09/20/2017   Uterine leiomyoma  09/11/2017   Slow transit constipation 09/11/2017   Cyst of right ovary 09/11/2017   Pelvic pain in female 09/11/2017   Carpal tunnel syndrome 09/04/2017   Cauliflower ear 09/04/2017   Headache syndrome 01/03/2014   Family history of malignant neoplasm 10/03/2012     Past Surgical History   Past Surgical History:  Procedure Laterality Date   AMPUTATION FINGER     AMPUTATION TOE     NO PAST SURGERIES       Home Medications   Prior to Admission medications   Medication Sig Start Date End Date Taking? Authorizing Provider  acetaminophen  (TYLENOL ) 325 MG tablet Take 650 mg by mouth 4 (four) times daily.    [provider]  albuterol  (VENTOLIN  HFA) 108 (90 Base) MCG/ACT inhaler ProAir  HFA 108 (90 Base) MCG/ACT Inhalation Aerosol Solution QTY: 1 inhaler Days: 30 Refills: 1  Written: 02/27/18 Patient Instructions: Inhale 1 puff orally every 6 hours as needed for shortness of breath 02/27/18   [provider]  ASPIRIN  LOW DOSE 81 MG chewable tablet Chew 1 tablet (81 mg total) by mouth daily. 03/04/24   Scoggins, Amber, NP  baclofen  (LIORESAL ) 10 MG tablet Take 1 tablet (10 mg total) by mouth daily. 03/04/24 03/04/25  Scoggins, Amber, NP  citalopram  (CELEXA ) 10 MG tablet Take 1 tablet (10 mg total) by mouth daily. 03/04/24   Scoggins, Amber, NP  ergocalciferol  (VITAMIN D2) 1.25 MG (50000 UT) capsule  Ergocalciferol  1.25 MG (50000 UT) Oral Capsule QTY: 12 capsule Days: 84 Refills: 3  Written: 03/14/19 Patient Instructions: Take 1 capsule by mouth WEEKLY 03/14/19   [provider]  fluticasone (FLONASE) 50 MCG/ACT nasal spray Fluticasone Propionate 50 MCG/ACT Nasal Suspension QTY: 1 bottle Days: 30 Refills: 4  Written: 02/19/18 Patient Instructions: Lean head forward, instill 1 spray in each nostril daily with a gentle sniff 02/19/18   [provider]  gabapentin  (NEURONTIN ) 100 MG capsule Take 100 mg by mouth 2 (two) times daily as needed (Nerve Pain). 12/25/23    [provider]  loratadine  (CLARITIN ) 10 MG tablet Take 1 tablet (10 mg total) by mouth daily. 03/04/24   Scoggins, Amber, NP  potassium chloride  (KLOR-CON  M) 10 MEQ tablet Take 1 tablet (10 mEq total) by mouth 2 (two) times daily. 03/01/24   Aisha Hove, MD  rosuvastatin  (CRESTOR ) 20 MG tablet Take 1 tablet (20 mg total) by mouth daily. 03/04/24   Scoggins, Amber, NP  SUMAtriptan  (IMITREX ) 50 MG tablet Take 50 mg by mouth 2 (two) times daily as needed. 11/27/23   [provider]  triamcinolone (KENALOG) 0.025 % cream Apply 1 Application topically daily as needed (rash). 11/27/23   [provider]     Allergies  Patient has no known allergies.   Family History   Family History  Problem Relation Age of Onset   Heart disease Mother    Bipolar disorder Mother    Drug abuse Mother    Alcohol abuse Mother    Breast cancer Mother 47   Schizophrenia Father    Lung cancer Maternal Grandfather      Physical Exam  Triage Vital Signs: ED Triage Vitals [03/13/24 0201]  Encounter Vitals Group     BP (!) 139/97     Systolic BP Percentile      Diastolic BP Percentile      Pulse Rate 79     Resp 18     Temp 98.6 F (37 C)     Temp Source Oral     SpO2 100 %     Weight 210 lb (95.3 kg)     Height 4\' 11"  (1.499 m)     Head Circumference      Peak Flow      Pain Score 10     Pain Loc      Pain Education      Exclude from Growth Chart     Updated Vital Signs: BP (!) 141/53 (BP Location: Left Arm)   Pulse 98   Temp 98.2 F (36.8 C) (Oral)   Resp 18   Ht 4\' 11"  (1.499 m)   Wt 95.3 kg   LMP 03/13/2024 (Exact Date)   SpO2 97%   BMI 42.41 kg/m    General: Asleep, awakened for exam, mild distress.  CV:  RRR.  Good peripheral perfusion.  Resp:  Normal effort.  CTAB. Abd:  No distention.  Other:  PERRL.  EOMI.  No carotid bruits.  Supple neck without meningismus.  Slow reaction but alert and oriented x 3.  CN II-XII grossly intact.  5/5 motor  strength and sensation all extremities.   ED Results / Procedures / Treatments  Labs (all labs ordered are listed, but only abnormal results are displayed) Labs Reviewed  CBC WITH DIFFERENTIAL/PLATELET - Abnormal; Notable for the following components:      Result Value   Hemoglobin 11.3 (*)    HCT 35.8 (*)    All other  components within normal limits  COMPREHENSIVE METABOLIC PANEL WITH GFR - Abnormal; Notable for the following components:   Potassium 3.3 (*)    CO2 21 (*)    Glucose, Bld 117 (*)    Calcium  8.6 (*)    Albumin 3.4 (*)    All other components within normal limits  MAGNESIUM   HCG, QUANTITATIVE, PREGNANCY     EKG  ED ECG REPORT I, Louvenia Golomb J, the attending physician, personally viewed and interpreted this ECG.   Date: 03/13/2024  EKG Time: 0209  Rate: 78  Rhythm: normal sinus rhythm  Axis: Normal  Intervals:none  ST&T Change: Nonspecific    RADIOLOGY I have independently visualized and interpreted patient's imaging study as well as noted the radiology interpretation:  MRI brain: No acute process  Official radiology report(s): MR BRAIN WO CONTRAST Result Date: 03/13/2024 CLINICAL DATA:  Headache, increasing frequency or severity EXAM: MRI HEAD WITHOUT CONTRAST TECHNIQUE: Multiplanar, multiecho pulse sequences of the brain and surrounding structures were obtained without intravenous contrast. COMPARISON:  Head CT from 02/29/2024 FINDINGS: Brain: No acute infarction, hemorrhage, hydrocephalus, extra-axial collection or mass lesion. Few chronic white matter insults, some of which could be related to artifact on FLAIR imaging. No specific or generalized white matter disease. Normal brain volume. Vascular: Normal flow voids Skull and upper cervical spine: Normal marrow signal Sinuses/Orbits: Unremarkable Other: Progressively motion degraded study, later sequences are primarily nondiagnostic. IMPRESSION: Motion degraded study with multiple nondiagnostic sequences.  Diffusion imaging is diagnostic and negative for infarct. No explanation for headache. Electronically Signed   By: Ronnette Coke M.D.   On: 03/13/2024 06:44     PROCEDURES:  Critical Care performed: No  .1-3 Lead EKG Interpretation  Performed by: Norlene Beavers, MD Authorized by: Norlene Beavers, MD     Interpretation: normal     ECG rate:  75   ECG rate assessment: normal     Rhythm: sinus rhythm     Ectopy: none     Conduction: normal   Comments:     Patient placed on cardiac monitor to evaluate for arrhythmias    MEDICATIONS ORDERED IN ED: Medications  dexamethasone  (DECADRON ) injection 10 mg (10 mg Intravenous Given 03/13/24 0459)  metoCLOPramide  (REGLAN ) injection 5 mg (5 mg Intravenous Given 03/13/24 0500)  ketorolac  (TORADOL ) 30 MG/ML injection 15 mg (15 mg Intravenous Given 03/13/24 0459)  LORazepam  (ATIVAN ) injection 1 mg (1 mg Intravenous Given 03/13/24 0559)     IMPRESSION / MDM / ASSESSMENT AND PLAN / ED COURSE  I reviewed the triage vital signs and the nursing notes.                             48 year old female who returns to the ED from home with acute on chronic headache. Differential diagnosis includes, but is not limited to, intracranial hemorrhage, meningitis/encephalitis, previous head trauma, cavernous venous thrombosis, tension headache, temporal arteritis, migraine or migraine equivalent, idiopathic intracranial hypertension, and non-specific headache.  I personally reviewed patient's records and note her hospitalizations from 4/10 and 03/10/2024.  Patient's presentation is most consistent with acute complicated illness / injury requiring diagnostic workup.  The patient is on the cardiac monitor to evaluate for evidence of arrhythmia and/or significant heart rate changes.  Laboratory results demonstrate improved potassium, 3.3 tonight.  CBC, magnesium  negative.  Family member is understandably frustrated at repeat visits for headache.  Given that patient has  never had formal workup for headache and  recent CT head, will obtain MRI brain.  Administer IV Decadron , Toradol  and Reglan  for headache.  Will reassess.  Clinical Course as of 03/13/24 0650  Wed Mar 13, 2024  0603 MRI incomplete; tech indicates patient was anxious.  Will administer IV Ativan  and send back to MRI to complete her imaging. [JS]  [id number] MRI for acute process.  Patient is feeling better.  He mains neurologically intact.  Will prescribe headache medicine and refer to neurology for close outpatient follow-up.  Strict return precautions given.  Patient and family member verbalized understanding and agree with plan of care. [JS]    Clinical Course User Index [JS] Norlene Beavers, MD     FINAL CLINICAL IMPRESSION(S) / ED DIAGNOSES   Final diagnoses:  Headache disorder     Rx / DC Orders   ED Discharge Orders     None        Note:  This document was prepared using Dragon voice recognition software and may include unintentional dictation errors.   Beckem Tomberlin J, MD 03/13/24 [phone]

## 2024-03-13 NOTE — Discharge Instructions (Signed)
 Please call the number above to schedule an appointment with the neurologist to evaluate you for your frequent headaches.  You may take headache medicine as needed.  Return to the ER for worsening symptoms, persistent vomiting, difficulty breathing or other concerns.

## 2024-03-13 NOTE — ED Notes (Signed)
 Attempted to obtain an IV in both arms, however unsuccessful due to poor vascular access and fidgety pt. IV team consult made.

## 2024-03-13 NOTE — Progress Notes (Signed)
Pt Currently at MRI  

## 2024-04-08 ENCOUNTER — Encounter: Payer: Self-pay | Admitting: Cardiology

## 2024-04-08 ENCOUNTER — Ambulatory Visit: Admitting: Cardiology

## 2024-04-08 VITALS — BP 98/84 | HR 80 | Ht 59.0 in | Wt 208.0 lb

## 2024-04-08 DIAGNOSIS — G8929 Other chronic pain: Secondary | ICD-10-CM | POA: Diagnosis not present

## 2024-04-08 DIAGNOSIS — H6123 Impacted cerumen, bilateral: Secondary | ICD-10-CM | POA: Diagnosis not present

## 2024-04-08 DIAGNOSIS — Z0130 Encounter for examination of blood pressure without abnormal findings: Secondary | ICD-10-CM

## 2024-04-08 DIAGNOSIS — M479 Spondylosis, unspecified: Secondary | ICD-10-CM

## 2024-04-08 DIAGNOSIS — M5416 Radiculopathy, lumbar region: Secondary | ICD-10-CM

## 2024-04-08 NOTE — Progress Notes (Signed)
 Established Patient Office Visit  Subjective:  Patient ID: Alexandra Montes, female    DOB: [birth year]  Age: 48 y.o. MRN: [id number]  Chief Complaint  Patient presents with   Referral    Referral for Physical Therapy    Patient in office for an acute visit, requesting a referral to physical therapy. Patient complains of bilateral leg pain, back pain. Will send a referral to physical therapy.  Patient complains of build up in ears. On exam, bilateral cerumen impaction. Will perform ear irrigation today.     No other concerns at this time.   Past Medical History:  Diagnosis Date   Anxiety    Carpal tunnel syndrome    Cauliflower ear, right    DVT of deep femoral vein, left (HCC)    GERD (gastroesophageal reflux disease)    Headache    Hypertension    Ovarian cyst    Right    Past Surgical History:  Procedure Laterality Date   AMPUTATION FINGER     AMPUTATION TOE     NO PAST SURGERIES      Social History   Socioeconomic History   Marital status: Divorced    Spouse name: Not on file   Number of children: 0   Years of education: Not on file   Highest education level: GED or equivalent  Occupational History   Not on file  Tobacco Use   Smoking status: Never   Smokeless tobacco: Never  Vaping Use   Vaping status: Never Used  Substance and Sexual Activity   Alcohol use: No   Drug use: No   Sexual activity: Yes    Birth control/protection: None  Other Topics Concern   Not on file  Social History Narrative   Not on file   Social Drivers of Health   Financial Resource Strain: High Risk (03/27/2024)   Received from [facility]   Overall Financial Resource Strain (CARDIA)    Difficulty of Paying Living Expenses: Very hard  Food Insecurity: Food Insecurity Present (03/27/2024)   Received from [facility]   Hunger Vital Sign    Worried About Running Out of Food in the Last Year: Sometimes true    Ran Out of Food in the Last  Year: Sometimes true  Transportation Needs: No Transportation Needs (03/27/2024)   Received from [facility] - Transportation    In the past 12 months, has lack of transportation kept you from medical appointments or from getting medications?: No    Lack of Transportation (Non-Medical): No  Physical Activity: Inactive (11/20/2017)   Exercise Vital Sign    Days of Exercise per Week: 0 days    Minutes of Exercise per Session: 0 min  Stress: Not on file  Social Connections: Moderately Integrated (03/11/2024)   Social Connection and Isolation Panel [NHANES]    Frequency of Communication with Friends and Family: More than three times a week    Frequency of Social Gatherings with Friends and Family: More than three times a week    Attends Religious Services: 1 to 4 times per year    Active Member of [employer] or Organizations: Yes    Attends Banker Meetings: 1 to 4 times per year    Marital Status: Separated  Recent Concern: Social Connections - Socially Isolated (02/29/2024)   Social Connection and Isolation Panel [NHANES]    Frequency of Communication with Friends and Family: More than three times a week  Frequency of Social Gatherings with Friends and Family: More than three times a week    Attends Religious Services: Never    Database administrator or Organizations: No    Attends Banker Meetings: Never    Marital Status: Separated  Intimate Partner Violence: Not At Risk (03/11/2024)   Humiliation, Afraid, Rape, and Kick questionnaire    Fear of Current or Ex-Partner: No    Emotionally Abused: No    Physically Abused: No    Sexually Abused: No    Family History  Problem Relation Age of Onset   Heart disease Mother    Bipolar disorder Mother    Drug abuse Mother    Alcohol abuse Mother    Breast cancer Mother 54   Schizophrenia Father    Lung cancer Maternal Grandfather     No Known Allergies  Outpatient Medications Prior to  Visit  Medication Sig   nortriptyline (PAMELOR) 10 MG capsule Take 10 mg by mouth.   acetaminophen  (TYLENOL ) 325 MG tablet Take 650 mg by mouth 4 (four) times daily.   albuterol  (VENTOLIN  HFA) 108 (90 Base) MCG/ACT inhaler ProAir  HFA 108 (90 Base) MCG/ACT Inhalation Aerosol Solution QTY: 1 inhaler Days: 30 Refills: 1  Written: 02/27/18 Patient Instructions: Inhale 1 puff orally every 6 hours as needed for shortness of breath   ASPIRIN  LOW DOSE 81 MG chewable tablet Chew 1 tablet (81 mg total) by mouth daily.   baclofen  (LIORESAL ) 10 MG tablet Take 1 tablet (10 mg total) by mouth daily.   baclofen  (LIORESAL ) 10 MG tablet Take 10 mg by mouth 3 (three) times daily.   butalbital -acetaminophen -caffeine  (FIORICET ) 50-325-40 MG tablet Take 1 tablet by mouth every 6 (six) hours as needed for headache.   citalopram  (CELEXA ) 10 MG tablet Take 1 tablet (10 mg total) by mouth daily.   ergocalciferol  (VITAMIN D2) 1.25 MG (50000 UT) capsule Ergocalciferol  1.25 MG (50000 UT) Oral Capsule QTY: 12 capsule Days: 84 Refills: 3  Written: 03/14/19 Patient Instructions: Take 1 capsule by mouth WEEKLY   fluticasone (FLONASE) 50 MCG/ACT nasal spray Fluticasone Propionate 50 MCG/ACT Nasal Suspension QTY: 1 bottle Days: 30 Refills: 4  Written: 02/19/18 Patient Instructions: Lean head forward, instill 1 spray in each nostril daily with a gentle sniff   gabapentin  (NEURONTIN ) 100 MG capsule Take 100 mg by mouth 2 (two) times daily as needed (Nerve Pain).   loratadine  (CLARITIN ) 10 MG tablet Take 1 tablet (10 mg total) by mouth daily.   potassium chloride  (KLOR-CON  M) 10 MEQ tablet Take 1 tablet (10 mEq total) by mouth 2 (two) times daily.   rosuvastatin  (CRESTOR ) 20 MG tablet Take 1 tablet (20 mg total) by mouth daily.   SUMAtriptan  (IMITREX ) 50 MG tablet Take 50 mg by mouth 2 (two) times daily as needed.   triamcinolone (KENALOG) 0.025 % cream Apply 1 Application topically daily as needed (rash).   No facility-administered  medications prior to visit.    Review of Systems  Constitutional: Negative.   HENT:  Positive for ear discharge.   Eyes: Negative.   Respiratory: Negative.  Negative for shortness of breath.   Cardiovascular: Negative.  Negative for chest pain.  Gastrointestinal: Negative.  Negative for abdominal pain, constipation and diarrhea.  Genitourinary: Negative.   Musculoskeletal:  Positive for back pain and joint pain. Negative for myalgias.  Skin: Negative.   Neurological: Negative.  Negative for dizziness and headaches.  Endo/Heme/Allergies: Negative.   All other systems reviewed and are negative.  Objective:   BP 98/84   Pulse 80   Ht 4\' 11"  (1.499 m)   Wt 208 lb (94.3 kg)   LMP 03/13/2024 (Exact Date)   SpO2 98%   BMI 42.01 kg/m   Vitals:   04/08/24 1104  BP: 98/84  Pulse: 80  Height: 4\' 11"  (1.499 m)  Weight: 208 lb (94.3 kg)  SpO2: 98%  BMI (Calculated): 41.99    Physical Exam Vitals and nursing note reviewed.  Constitutional:      Appearance: Normal appearance. She is normal weight.  HENT:     Head: Normocephalic and atraumatic.     Right Ear: There is impacted cerumen.     Left Ear: There is impacted cerumen.     Nose: Nose normal.     Mouth/Throat:     Mouth: Mucous membranes are moist.  Eyes:     Extraocular Movements: Extraocular movements intact.     Conjunctiva/sclera: Conjunctivae normal.     Pupils: Pupils are equal, round, and reactive to light.  Cardiovascular:     Rate and Rhythm: Normal rate and regular rhythm.     Pulses: Normal pulses.     Heart sounds: Normal heart sounds.  Pulmonary:     Effort: Pulmonary effort is normal.     Breath sounds: Normal breath sounds.  Abdominal:     General: Abdomen is flat. Bowel sounds are normal.     Palpations: Abdomen is soft.  Musculoskeletal:        General: Normal range of motion.     Cervical back: Normal range of motion.  Skin:    General: Skin is warm and dry.  Neurological:      General: No focal deficit present.     Mental Status: She is alert and oriented to person, place, and time.  Psychiatric:        Mood and Affect: Mood normal.        Behavior: Behavior normal.        Thought Content: Thought content normal.        Judgment: Judgment normal.      No results found for any visits on 04/08/24.  Recent Results (from the past 2160 hours)  Lipase, blood     Status: None   Collection Time: 02/27/24  4:15 AM  Result Value Ref Range   Lipase 28 11 - 51 U/L    Comment: Performed at [facility], [address] [place], [place] [address]  Comprehensive metabolic panel     Status: Abnormal   Collection Time: 02/27/24  4:15 AM  Result Value Ref Range   Sodium 138 135 - 145 mmol/L   Potassium 2.7 (LL) 3.5 - 5.1 mmol/L    Comment: CRITICAL RESULT CALLED TO, READ BACK BY AND VERIFIED WITH LISA THOMPSON RN [phone] 02/27/24 HNM    Chloride 104 98 - 111 mmol/L   CO2 23 22 - 32 mmol/L   Glucose, Bld 131 (H) 70 - 99 mg/dL    Comment: Glucose reference range applies only to samples taken after fasting for at least 8 hours.   BUN 21 (H) 6 - 20 mg/dL   Creatinine, Ser 4.09 0.44 - 1.00 mg/dL   Calcium  8.8 (L) 8.9 - 10.3 mg/dL   Total Protein 7.6 6.5 - 8.1 g/dL   Albumin 3.4 (L) 3.5 - 5.0 g/dL   AST 31 15 - 41 U/L   ALT 28 0 - 44 U/L   Alkaline Phosphatase 69 38 - 126 U/L  Total Bilirubin 0.7 0.0 - 1.2 mg/dL   GFR, Estimated >37 >62 mL/min    Comment: (NOTE) Calculated using the CKD-EPI Creatinine Equation (2021)    Anion gap 11 5 - 15    Comment: Performed at [facility], [address] [place], [place] [address]  CBC     Status: Abnormal   Collection Time: 02/27/24  4:15 AM  Result Value Ref Range   WBC 6.3 4.0 - 10.5 K/uL   RBC 4.08 3.87 - 5.11 MIL/uL   Hemoglobin 11.9 (L) 12.0 - 15.0 g/dL   HCT 76.1 60.7 - 37.1 %   MCV 88.2 80.0 - 100.0 fL   MCH 29.2 26.0 - 34.0 pg   MCHC 33.1 30.0 - 36.0 g/dL   RDW 06.2 69.4 - 85.4 %   Platelets  207 150 - 400 K/uL   nRBC 0.0 0.0 - 0.2 %    Comment: Performed at [facility], [address] [place], [place] [address]  CBC with Differential     Status: Abnormal   Collection Time: 02/29/24  4:19 AM  Result Value Ref Range   WBC 7.2 4.0 - 10.5 K/uL   RBC 4.08 3.87 - 5.11 MIL/uL   Hemoglobin 11.8 (L) 12.0 - 15.0 g/dL   HCT 50.0 (L) 93.8 - 18.2 %   MCV 88.0 80.0 - 100.0 fL   MCH 28.9 26.0 - 34.0 pg   MCHC 32.9 30.0 - 36.0 g/dL   RDW 99.3 71.6 - 96.7 %   Platelets 209 150 - 400 K/uL   nRBC 0.0 0.0 - 0.2 %   Neutrophils Relative % 49 %   Neutro Abs 3.6 1.7 - 7.7 K/uL   Lymphocytes Relative 38 %   Lymphs Abs 2.7 0.7 - 4.0 K/uL   Monocytes Relative 11 %   Monocytes Absolute 0.8 0.1 - 1.0 K/uL   Eosinophils Relative 1 %   Eosinophils Absolute 0.0 0.0 - 0.5 K/uL   Basophils Relative 1 %   Basophils Absolute 0.1 0.0 - 0.1 K/uL   Immature Granulocytes 0 %   Abs Immature Granulocytes 0.02 0.00 - 0.07 K/uL    Comment: Performed at [facility], [address] [place], [place] [address]  Comprehensive metabolic panel     Status: Abnormal   Collection Time: 02/29/24  4:19 AM  Result Value Ref Range   Sodium 136 135 - 145 mmol/L   Potassium 2.6 (LL) 3.5 - 5.1 mmol/L    Comment: CRITICAL RESULT CALLED TO, READ BACK BY AND VERIFIED WITH HUNTER ORE 02/29/24 0456 MW    Chloride 100 98 - 111 mmol/L   CO2 23 22 - 32 mmol/L   Glucose, Bld 126 (H) 70 - 99 mg/dL    Comment: Glucose reference range applies only to samples taken after fasting for at least 8 hours.   BUN 20 6 - 20 mg/dL   Creatinine, Ser 0.17 0.44 - 1.00 mg/dL   Calcium  9.1 8.9 - 10.3 mg/dL   Total Protein 8.2 (H) 6.5 - 8.1 g/dL   Albumin 3.9 3.5 - 5.0 g/dL   AST 28 15 - 41 U/L   ALT 30 0 - 44 U/L   Alkaline Phosphatase 69 38 - 126 U/L   Total Bilirubin 1.2 0.0 - 1.2 mg/dL   GFR, Estimated >51 >02 mL/min    Comment: (NOTE) Calculated using the CKD-EPI Creatinine Equation (2021)    Anion gap 13 5 -  15    Comment: Performed at  [facility], [address], [place], [place] [address]  Troponin I (High Sensitivity)     Status: None   Collection Time: 02/29/24  4:19 AM  Result Value Ref Range   Troponin I (High Sensitivity) 10 <18 ng/L    Comment: (NOTE) Elevated high sensitivity troponin I (hsTnI) values and significant  changes across serial measurements may suggest ACS but many other  chronic and acute conditions are known to elevate hsTnI results.  Refer to the "Links" section for chest pain algorithms and additional  guidance. Performed at [facility], [address] [place], [place] [address]   Magnesium      Status: None   Collection Time: 02/29/24  4:19 AM  Result Value Ref Range   Magnesium  2.3 1.7 - 2.4 mg/dL    Comment: Performed at [facility], [address] [place], [place] [address]  Troponin I (High Sensitivity)     Status: None   Collection Time: 02/29/24  6:05 AM  Result Value Ref Range   Troponin I (High Sensitivity) 8 <18 ng/L    Comment: (NOTE) Elevated high sensitivity troponin I (hsTnI) values and significant  changes across serial measurements may suggest ACS but many other  chronic and acute conditions are known to elevate hsTnI results.  Refer to the "Links" section for chest pain algorithms and additional  guidance. Performed at [facility], [address] [place], [place] [address]   Urine Drug Screen, Qualitative [facility] only)     Status: Abnormal   Collection Time: 02/29/24  7:51 AM  Result Value Ref Range   Tricyclic, Ur Screen POSITIVE (A) NONE DETECTED   Amphetamines, Ur Screen NONE DETECTED NONE DETECTED   MDMA (Ecstasy)Ur Screen NONE DETECTED NONE DETECTED   Cocaine Metabolite,Ur [place] NONE DETECTED NONE DETECTED   Opiate, Ur Screen NONE DETECTED NONE DETECTED   Phencyclidine (PCP) Ur S NONE DETECTED NONE DETECTED   Cannabinoid 50 Ng, Ur [place] NONE DETECTED NONE DETECTED   Barbiturates, Ur Screen NONE  DETECTED NONE DETECTED   Benzodiazepine, Ur Scrn NONE DETECTED NONE DETECTED   Methadone Scn, Ur NONE DETECTED NONE DETECTED    Comment: (NOTE) Tricyclics + metabolites, urine    Cutoff 1000 ng/mL Amphetamines + metabolites, urine  Cutoff 1000 ng/mL MDMA (Ecstasy), urine              Cutoff 500 ng/mL Cocaine Metabolite, urine          Cutoff 300 ng/mL Opiate + metabolites, urine        Cutoff 300 ng/mL Phencyclidine (PCP), urine         Cutoff 25 ng/mL Cannabinoid, urine                 Cutoff 50 ng/mL Barbiturates + metabolites, urine  Cutoff 200 ng/mL Benzodiazepine, urine              Cutoff 200 ng/mL Methadone, urine                   Cutoff 300 ng/mL  The urine drug screen provides only a preliminary, unconfirmed analytical test result and should not be used for non-medical purposes. Clinical consideration and professional judgment should be applied to any positive drug screen result due to possible interfering substances. A more specific alternate chemical method must be used in order to obtain a confirmed analytical result. Gas chromatography / mass spectrometry (GC/MS) is the preferred confirm atory method. Performed at [facility], [address]  [place], [place] [address]   HIV Antibody (routine testing w rflx)     Status: None   Collection Time: 02/29/24  7:51 AM  Result Value Ref Range   HIV Screen 4th Generation wRfx Non Reactive Non Reactive    Comment: Performed at [facility], [address] [place], [place] [address]  D-dimer, quantitative     Status: None   Collection Time: 02/29/24  7:51 AM  Result Value Ref Range   D-Dimer, Quant 0.34 0.00 - 0.50 ug/mL-FEU    Comment: (NOTE) At the manufacturer cut-off value of 0.5 g/mL FEU, this assay has a negative predictive value of 95-100%.This assay is intended for use in conjunction with a clinical pretest probability (PTP) assessment model to exclude pulmonary embolism (PE) and deep venous  thrombosis (DVT) in outpatients suspected of PE or DVT. Results should be correlated with clinical presentation. Performed at [facility], [address] [place], [place] [address]   Basic metabolic panel     Status: Abnormal   Collection Time: 02/29/24  3:40 PM  Result Value Ref Range   Sodium 139 135 - 145 mmol/L   Potassium 3.3 (L) 3.5 - 5.1 mmol/L   Chloride 106 98 - 111 mmol/L   CO2 26 22 - 32 mmol/L   Glucose, Bld 79 70 - 99 mg/dL    Comment: Glucose reference range applies only to samples taken after fasting for at least 8 hours.   BUN 10 6 - 20 mg/dL   Creatinine, Ser 2.13 0.44 - 1.00 mg/dL   Calcium  8.4 (L) 8.9 - 10.3 mg/dL   GFR, Estimated >08 >65 mL/min    Comment: (NOTE) Calculated using the CKD-EPI Creatinine Equation (2021)    Anion gap 7 5 - 15    Comment: Performed at [facility], [address] [place], [place] [address]  CBC     Status: Abnormal   Collection Time: 03/01/24  5:07 AM  Result Value Ref Range   WBC 4.8 4.0 - 10.5 K/uL   RBC 3.82 (L) 3.87 - 5.11 MIL/uL   Hemoglobin 11.2 (L) 12.0 - 15.0 g/dL   HCT 62.9 (L) 52.8 - 41.3 %   MCV 89.0 80.0 - 100.0 fL   MCH 29.3 26.0 - 34.0 pg   MCHC 32.9 30.0 - 36.0 g/dL   RDW 24.4 01.0 - 27.2 %   Platelets 193 150 - 400 K/uL   nRBC 0.0 0.0 - 0.2 %    Comment: Performed at [facility], [address] [place], [place] [address]  Comprehensive metabolic panel     Status: Abnormal   Collection Time: 03/01/24  5:07 AM  Result Value Ref Range   Sodium 140 135 - 145 mmol/L   Potassium 3.8 3.5 - 5.1 mmol/L   Chloride 107 98 - 111 mmol/L   CO2 27 22 - 32 mmol/L   Glucose, Bld 107 (H) 70 - 99 mg/dL    Comment: Glucose reference range applies only to samples taken after fasting for at least 8 hours.   BUN 10 6 - 20 mg/dL   Creatinine, Ser 4.03 0.44 - 1.00 mg/dL   Calcium  8.7 (L) 8.9 - 10.3 mg/dL   Total Protein 7.0 6.5 - 8.1 g/dL   Albumin 3.2 (L) 3.5 - 5.0 g/dL   AST 20 15 - 41  U/L   ALT 23 0 - 44 U/L   Alkaline Phosphatase 57 38 - 126 U/L   Total Bilirubin 0.8 0.0 -  1.2 mg/dL   GFR, Estimated >16 >10 mL/min    Comment: (NOTE) Calculated using the CKD-EPI Creatinine Equation (2021)    Anion gap 6 5 - 15    Comment: Performed at [facility], [address] [place], [place] [address]  POCT Urine Pregnancy     Status: Normal   Collection Time: 03/04/24  9:37 AM  Result Value Ref Range   Preg Test, Ur Negative Negative  Lipid Profile     Status: None   Collection Time: 03/04/24  9:56 AM  Result Value Ref Range   Cholesterol, Total 112 100 - 199 mg/dL   Triglycerides 52 0 - 149 mg/dL   HDL 41 >40 mg/dL   VLDL Cholesterol Cal 12 5 - 40 mg/dL   LDL Chol Calc (NIH) 59 0 - 99 mg/dL   Chol/HDL Ratio 2.7 0.0 - 4.4 ratio    Comment:                                   T. Chol/HDL Ratio                                             Men  Women                               1/2 Avg.Risk  3.4    3.3                                   Avg.Risk  5.0    4.4                                2X Avg.Risk  9.6    7.1                                3X Avg.Risk 23.4   11.0   CMP14+EGFR     Status: Abnormal   Collection Time: 03/04/24  9:56 AM  Result Value Ref Range   Glucose 81 70 - 99 mg/dL   BUN 13 6 - 24 mg/dL   Creatinine, Ser 9.81 (L) 0.57 - 1.00 mg/dL   eGFR 191 >47 WG/NFA/2.13   BUN/Creatinine Ratio 27 (H) 9 - 23   Sodium 142 134 - 144 mmol/L   Potassium 4.3 3.5 - 5.2 mmol/L   Chloride 104 96 - 106 mmol/L   CO2 25 20 - 29 mmol/L   Calcium  8.6 (L) 8.7 - 10.2 mg/dL   Total Protein 6.7 6.0 - 8.5 g/dL   Albumin 3.9 3.9 - 4.9 g/dL   Globulin, Total 2.8 1.5 - 4.5 g/dL   Bilirubin Total 0.4 0.0 - 1.2 mg/dL   Alkaline Phosphatase 81 44 - 121 IU/L   AST 13 0 - 40 IU/L   ALT 18 0 - 32 IU/L  Hemoglobin A1c     Status: None   Collection Time: 03/04/24  9:56 AM  Result Value Ref Range   Hgb A1c MFr Bld 4.8 4.8 - 5.6 %    Comment:  Prediabetes: 5.7 -  6.4          Diabetes: >6.4          Glycemic control for adults with diabetes: <7.0    Est. average glucose Bld gHb Est-mCnc 91 mg/dL  Vitamin D  (25 hydroxy)     Status: None   Collection Time: 03/04/24  9:56 AM  Result Value Ref Range   Vit D, 25-Hydroxy 50.9 30.0 - 100.0 ng/mL    Comment: Vitamin D  deficiency has been defined by the Institute of Medicine and an Endocrine Society practice guideline as a level of serum 25-OH vitamin D  less than 20 ng/mL (1,2). The Endocrine Society went on to further define vitamin D  insufficiency as a level between 21 and 29 ng/mL (2). 1. IOM (Institute of Medicine). 2010. Dietary reference    intakes for calcium  and D. [place]: The    [employer]. 2. Holick MF, Binkley [place], Bischoff-Ferrari HA, et al.    Evaluation, treatment, and prevention of vitamin D     deficiency: an Endocrine Society clinical practice    guideline. JCEM. 2011 Jul; 96(7):1911-30.   TSH     Status: None   Collection Time: 03/04/24  9:57 AM  Result Value Ref Range   TSH 2.310 0.450 - 4.500 uIU/mL  Basic metabolic panel     Status: Abnormal   Collection Time: 03/10/24  9:12 PM  Result Value Ref Range   Sodium 136 135 - 145 mmol/L   Potassium 2.4 (LL) 3.5 - 5.1 mmol/L    Comment: CRITICAL RESULT CALLED TO, READ BACK BY AND VERIFIED WITH Jammie Mccune RN @ 2135 03/10/24 BGH    Chloride 100 98 - 111 mmol/L   CO2 23 22 - 32 mmol/L   Glucose, Bld 118 (H) 70 - 99 mg/dL    Comment: Glucose reference range applies only to samples taken after fasting for at least 8 hours.   BUN 15 6 - 20 mg/dL   Creatinine, Ser 4.09 0.44 - 1.00 mg/dL   Calcium  8.9 8.9 - 10.3 mg/dL   GFR, Estimated >81 >19 mL/min    Comment: (NOTE) Calculated using the CKD-EPI Creatinine Equation (2021)    Anion gap 13 5 - 15    Comment: Performed at [facility], [address] [place], [place] [address]  CBC     Status: Abnormal   Collection Time: 03/10/24  9:12 PM  Result  Value Ref Range   WBC 7.5 4.0 - 10.5 K/uL   RBC 3.88 3.87 - 5.11 MIL/uL   Hemoglobin 11.4 (L) 12.0 - 15.0 g/dL   HCT 95.6 (L) 21.3 - 08.6 %   MCV 87.6 80.0 - 100.0 fL   MCH 29.4 26.0 - 34.0 pg   MCHC 33.5 30.0 - 36.0 g/dL   RDW 57.8 46.9 - 62.9 %   Platelets 218 150 - 400 K/uL   nRBC 0.0 0.0 - 0.2 %    Comment: Performed at [facility], [address] [place], [place] [address]  Hepatic function panel     Status: None   Collection Time: 03/10/24  9:12 PM  Result Value Ref Range   Total Protein 7.7 6.5 - 8.1 g/dL   Albumin 3.6 3.5 - 5.0 g/dL   AST 21 15 - 41 U/L   ALT 18 0 - 44 U/L   Alkaline Phosphatase 61 38 - 126 U/L   Total Bilirubin 0.8 0.0 - 1.2 mg/dL   Bilirubin, Direct <3.2 0.0 - 0.2  mg/dL   Indirect Bilirubin NOT CALCULATED 0.3 - 0.9 mg/dL    Comment: Performed at [facility], [address] [place], [place] [address]  Magnesium      Status: None   Collection Time: 03/10/24  9:12 PM  Result Value Ref Range   Magnesium  1.9 1.7 - 2.4 mg/dL    Comment: Performed at [facility], [address] [place], [place] [address]  CBC     Status: Abnormal   Collection Time: 03/11/24 12:12 AM  Result Value Ref Range   WBC 6.8 4.0 - 10.5 K/uL   RBC 3.99 3.87 - 5.11 MIL/uL   Hemoglobin 11.6 (L) 12.0 - 15.0 g/dL   HCT 19.1 (L) 47.8 - 29.5 %   MCV 89.5 80.0 - 100.0 fL   MCH 29.1 26.0 - 34.0 pg   MCHC 32.5 30.0 - 36.0 g/dL   RDW 62.1 30.8 - 65.7 %   Platelets 209 150 - 400 K/uL   nRBC 0.0 0.0 - 0.2 %    Comment: Performed at [facility], [address] [place], [place] [address]  Creatinine, serum     Status: None   Collection Time: 03/11/24 12:12 AM  Result Value Ref Range   Creatinine, Ser 0.59 0.44 - 1.00 mg/dL   GFR, Estimated >29 >52 mL/min    Comment: (NOTE) Calculated using the CKD-EPI Creatinine Equation (2021) Performed at [facility], [address], [place], [place] [address]   Basic metabolic panel with GFR      Status: Abnormal   Collection Time: 03/11/24  5:22 AM  Result Value Ref Range   Sodium 134 (L) 135 - 145 mmol/L   Potassium 3.7 3.5 - 5.1 mmol/L   Chloride 102 98 - 111 mmol/L   CO2 24 22 - 32 mmol/L   Glucose, Bld 125 (H) 70 - 99 mg/dL    Comment: Glucose reference range applies only to samples taken after fasting for at least 8 hours.   BUN 9 6 - 20 mg/dL   Creatinine, Ser 4.40 0.44 - 1.00 mg/dL   Calcium  8.3 (L) 8.9 - 10.3 mg/dL   GFR, Estimated >10 >27 mL/min    Comment: (NOTE) Calculated using the CKD-EPI Creatinine Equation (2021)    Anion gap 8 5 - 15    Comment: Performed at [facility], [address] [place], [place] [address]  Basic metabolic panel with GFR     Status: Abnormal   Collection Time: 03/11/24 11:05 AM  Result Value Ref Range   Sodium 136 135 - 145 mmol/L   Potassium 3.6 3.5 - 5.1 mmol/L   Chloride 103 98 - 111 mmol/L   CO2 27 22 - 32 mmol/L   Glucose, Bld 103 (H) 70 - 99 mg/dL    Comment: Glucose reference range applies only to samples taken after fasting for at least 8 hours.   BUN 8 6 - 20 mg/dL   Creatinine, Ser 4.40 0.44 - 1.00 mg/dL   Calcium  8.6 (L) 8.9 - 10.3 mg/dL   GFR, Estimated >34 >74 mL/min    Comment: (NOTE) Calculated using the CKD-EPI Creatinine Equation (2021)    Anion gap 6 5 - 15    Comment: Performed at [facility], [address] [place], [place] [address]  CBC with Differential     Status: Abnormal   Collection Time: 03/13/24  2:01 AM  Result Value Ref Range   WBC 6.6 4.0 - 10.5 K/uL   RBC 3.89  3.87 - 5.11 MIL/uL   Hemoglobin 11.3 (L) 12.0 - 15.0 g/dL   HCT 16.1 (L) 09.6 - 04.5 %   MCV 92.0 80.0 - 100.0 fL   MCH 29.0 26.0 - 34.0 pg   MCHC 31.6 30.0 - 36.0 g/dL   RDW 40.9 81.1 - 91.4 %   Platelets 229 150 - 400 K/uL   nRBC 0.0 0.0 - 0.2 %   Neutrophils Relative % 45 %   Neutro Abs 3.0 1.7 - 7.7 K/uL   Lymphocytes Relative 44 %   Lymphs Abs 2.9 0.7 - 4.0 K/uL   Monocytes Relative 9 %    Monocytes Absolute 0.6 0.1 - 1.0 K/uL   Eosinophils Relative 1 %   Eosinophils Absolute 0.1 0.0 - 0.5 K/uL   Basophils Relative 1 %   Basophils Absolute 0.0 0.0 - 0.1 K/uL   Immature Granulocytes 0 %   Abs Immature Granulocytes 0.02 0.00 - 0.07 K/uL    Comment: Performed at [facility], [address] [place], [place] [address]  Comprehensive metabolic panel     Status: Abnormal   Collection Time: 03/13/24  2:01 AM  Result Value Ref Range   Sodium 135 135 - 145 mmol/L   Potassium 3.3 (L) 3.5 - 5.1 mmol/L   Chloride 104 98 - 111 mmol/L   CO2 21 (L) 22 - 32 mmol/L   Glucose, Bld 117 (H) 70 - 99 mg/dL    Comment: Glucose reference range applies only to samples taken after fasting for at least 8 hours.   BUN 16 6 - 20 mg/dL   Creatinine, Ser 6.21 0.44 - 1.00 mg/dL   Calcium  8.6 (L) 8.9 - 10.3 mg/dL   Total Protein 7.3 6.5 - 8.1 g/dL   Albumin 3.4 (L) 3.5 - 5.0 g/dL   AST 17 15 - 41 U/L   ALT 14 0 - 44 U/L   Alkaline Phosphatase 56 38 - 126 U/L   Total Bilirubin 0.5 0.0 - 1.2 mg/dL   GFR, Estimated >30 >86 mL/min    Comment: (NOTE) Calculated using the CKD-EPI Creatinine Equation (2021)    Anion gap 10 5 - 15    Comment: Performed at [facility], [address] [place], [place] [address]  Magnesium      Status: None   Collection Time: 03/13/24  2:01 AM  Result Value Ref Range   Magnesium  2.0 1.7 - 2.4 mg/dL    Comment: Performed at [facility], [address] [place], [place] [address]  hCG, quantitative, pregnancy     Status: None   Collection Time: 03/13/24  2:06 AM  Result Value Ref Range   hCG, Beta Chain, Quant, S <1 <5 mIU/mL    Comment:          GEST. AGE      CONC.  (mIU/mL)   <=1 WEEK        5 - 50     2 WEEKS       50 - 500     3 WEEKS       100 - 10,000     4 WEEKS     1,000 - 30,000     5 WEEKS     3,500 - 115,000   6-8 WEEKS     12,000 - 270,000    12 WEEKS     15,000 - 220,000        FEMALE AND NON-PREGNANT FEMALE:     LESS  THAN 5  mIU/mL Performed at [facility], [address] [place], [place] [address]       Assessment & Plan:  Referral sent to physical therapy Bilateral ear irrigation today  Problem List Items Addressed This Visit       Nervous and Auditory   Bilateral impacted cerumen     Musculoskeletal and Integument   Spondylosis - Primary   Relevant Orders   Ambulatory referral to Physical Therapy     Other   Chronic radicular lumbar pain   Relevant Medications   baclofen  (LIORESAL ) 10 MG tablet   nortriptyline (PAMELOR) 10 MG capsule   Other Relevant Orders   Ambulatory referral to Physical Therapy    Return if symptoms worsen or fail to improve, for as scheduled.   Total time spent: 25 minutes  [employer], NP  04/08/2024   This document may have been prepared by Dragon Voice Recognition software and as such may include unintentional dictation errors.

## 2024-04-19 ENCOUNTER — Other Ambulatory Visit: Payer: Self-pay | Admitting: Cardiology

## 2024-04-19 ENCOUNTER — Telehealth: Payer: Self-pay

## 2024-04-19 MED ORDER — POTASSIUM CHLORIDE CRYS ER 10 MEQ PO TBCR: 10.0000 meq | EXTENDED_RELEASE_TABLET | Freq: Two times a day (BID) | ORAL | 4 refills | Status: DC

## 2024-04-19 NOTE — Telephone Encounter (Signed)
 Pt called asking if you can send her rx refills for her potassium for twice daily- she mentioned last time she was at the hospital they said for her to take rx twice daily. Please advise

## 2024-04-24 ENCOUNTER — Other Ambulatory Visit: Payer: Self-pay | Admitting: Cardiology

## 2024-04-28 ENCOUNTER — Encounter: Payer: Self-pay | Admitting: Emergency Medicine

## 2024-04-28 ENCOUNTER — Other Ambulatory Visit: Payer: Self-pay

## 2024-04-28 ENCOUNTER — Emergency Department

## 2024-04-28 ENCOUNTER — Emergency Department
Admission: EM | Admit: 2024-04-28 | Discharge: 2024-04-28 | Disposition: A | Discharge status: Home / Self Care | Attending: Emergency Medicine | Admitting: Emergency Medicine

## 2024-04-28 DIAGNOSIS — R600 Localized edema: Secondary | ICD-10-CM | POA: Diagnosis present

## 2024-04-28 LAB — CBC
HCT: 35.2 % — ABNORMAL LOW (ref 36.0–46.0)
Hemoglobin: 11.1 g/dL — ABNORMAL LOW (ref 12.0–15.0)
MCH: 28 pg (ref 26.0–34.0)
MCHC: 31.5 g/dL (ref 30.0–36.0)
MCV: 88.9 fL (ref 80.0–100.0)
Platelets: 210 10*3/uL (ref 150–400)
RBC: 3.96 MIL/uL (ref 3.87–5.11)
RDW: 13.6 % (ref 11.5–15.5)
WBC: 5.4 10*3/uL (ref 4.0–10.5)
nRBC: 0 % (ref 0.0–0.2)

## 2024-04-28 LAB — BASIC METABOLIC PANEL WITH GFR
Anion gap: 8 (ref 5–15)
BUN: 12 mg/dL (ref 6–20)
CO2: 25 mmol/L (ref 22–32)
Calcium: 8.7 mg/dL — ABNORMAL LOW (ref 8.9–10.3)
Chloride: 104 mmol/L (ref 98–111)
Creatinine, Ser: 0.72 mg/dL (ref 0.44–1.00)
GFR, Estimated: >60 mL/min (ref >60)
Glucose, Bld: 84 mg/dL (ref 70–99)
Potassium: 4.1 mmol/L (ref 3.5–5.1)
Sodium: 137 mmol/L (ref 135–145)

## 2024-04-28 LAB — BRAIN NATRIURETIC PEPTIDE: B Natriuretic Peptide: 14.2 pg/mL (ref 0.0–100.0)

## 2024-04-28 MED ORDER — SPIRONOLACTONE 25 MG PO TABS: 25.0000 mg | ORAL_TABLET | Freq: Every day | ORAL | 11 refills | Status: AC

## 2024-04-28 NOTE — ED Triage Notes (Signed)
 Pt states she was taken off of her diuretic approximately one month ago and over the last few days she's had worsening DOE and bilateral leg swelling.

## 2024-04-28 NOTE — Discharge Instructions (Addendum)
 Stop taking the potassium pills as we discussed

## 2024-04-28 NOTE — ED Provider Notes (Signed)
 [facility] Provider Note    Event Date/Time   First MD Initiated Contact with Patient 04/28/24 1445     (approximate)   History   Lower extremity edema   HPI  Alexandra Montes is a 48 y.o. female who presents with complaints of mild lower extremity edema over the last week or 2.  She reports she was taken off of her fluid pill in the past because of low potassium, confirmed this by reviewing her hospitalization records from April 21.  She reports she has been taking supplemental potassium but now that she is off fluid pills her legs are swelling again.  She has no shortness of breath     Physical Exam   Triage Vital Signs: ED Triage Vitals  Encounter Vitals Group     BP 04/28/24 1433 (!) 139/94     Systolic BP Percentile --      Diastolic BP Percentile --      Pulse Rate 04/28/24 1433 92     Resp 04/28/24 1433 18     Temp 04/28/24 1433 98.2 F (36.8 C)     Temp Source 04/28/24 1433 Oral     SpO2 04/28/24 1433 98 %     Weight 04/28/24 1431 95.3 kg (210 lb)     Height 04/28/24 1431 1.499 m (4\' 11" )     Head Circumference --      Peak Flow --      Pain Score 04/28/24 1431 0     Pain Loc --      Pain Education --      Exclude from Growth Chart --     Most recent vital signs: Vitals:   04/28/24 1433 04/28/24 1541  BP: (!) 139/94 134/84  Pulse: 92 88  Resp: 18 17  Temp: 98.2 F (36.8 C) 98.1 F (36.7 C)  SpO2: 98% 98%     General: Awake, no distress.  CV:  Good peripheral perfusion.  Resp:  Normal effort.  Abd:  No distention.  Other:  Minimal edema bilaterally   ED Results / Procedures / Treatments   Labs (all labs ordered are listed, but only abnormal results are displayed) Labs Reviewed  BASIC METABOLIC PANEL WITH GFR - Abnormal; Notable for the following components:      Result Value   Calcium  8.7 (*)    All other components within normal limits  CBC - Abnormal; Notable for the following components:   Hemoglobin 11.1 (*)     HCT 35.2 (*)    All other components within normal limits  BRAIN NATRIURETIC PEPTIDE  POC URINE PREG, ED     EKG  ED ECG REPORT I, Bryson Carbine, the attending physician, personally viewed and interpreted this ECG.  Date: 04/28/2024  Rhythm: normal sinus rhythm QRS Axis: normal Intervals: normal ST/T Wave abnormalities: normal Narrative Interpretation: no evidence of acute ischemia    RADIOLOGY Chest x-ray viewed interpret by me, no acute abnormality    PROCEDURES:  Critical Care performed:   Procedures   MEDICATIONS ORDERED IN ED: Medications - No data to display   IMPRESSION / MDM / ASSESSMENT AND PLAN / ED COURSE  I reviewed the triage vital signs and the nursing notes. Patient's presentation is most consistent with acute illness / injury with system symptoms.  Patient presents with bilateral lower extremity edema which she has had in the past and had been well-controlled with diuretic.  Was taken off this because of hypokalemia.  This seems  to be controlled now.  In reviewing past hospitalization on April 21 it appears that spironolactone was recommended as a fluid pill which would not affect her potassium but it does not appear to have been prescribed.  Will start her on spironolactone, have asked her to DC potassium supplementation given that spironolactone can cause hyperkalemia.  She will follow-up with her PCP in 1 week to reevaluate.        FINAL CLINICAL IMPRESSION(S) / ED DIAGNOSES   Final diagnoses:  Peripheral edema     Rx / DC Orders   ED Discharge Orders          Ordered    spironolactone (ALDACTONE) 25 MG tablet  Daily        04/28/24 1526             Note:  This document was prepared using Dragon voice recognition software and may include unintentional dictation errors.   Bryson Carbine, MD 04/28/24 [phone]

## 2024-04-28 NOTE — ED Notes (Signed)
 See triage note  Presents with bilateral lower ext swelling  States she was on fluid meds but has not taken for a month    Swelling and pain became worse in 3 days

## 2024-04-29 ENCOUNTER — Ambulatory Visit: Attending: Cardiology

## 2024-04-29 DIAGNOSIS — G8929 Other chronic pain: Secondary | ICD-10-CM | POA: Insufficient documentation

## 2024-04-29 DIAGNOSIS — M479 Spondylosis, unspecified: Secondary | ICD-10-CM | POA: Diagnosis not present

## 2024-04-29 DIAGNOSIS — M6281 Muscle weakness (generalized): Secondary | ICD-10-CM | POA: Diagnosis present

## 2024-04-29 DIAGNOSIS — M5459 Other low back pain: Secondary | ICD-10-CM | POA: Diagnosis present

## 2024-04-29 DIAGNOSIS — R2689 Other abnormalities of gait and mobility: Secondary | ICD-10-CM | POA: Insufficient documentation

## 2024-04-29 DIAGNOSIS — M5416 Radiculopathy, lumbar region: Secondary | ICD-10-CM | POA: Diagnosis present
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
## 2024-05-06 ENCOUNTER — Ambulatory Visit

## 2024-05-06 DIAGNOSIS — M5459 Other low back pain: Secondary | ICD-10-CM | POA: Diagnosis not present

## 2024-05-06 DIAGNOSIS — M6281 Muscle weakness (generalized): Secondary | ICD-10-CM

## 2024-05-06 DIAGNOSIS — M5416 Radiculopathy, lumbar region: Secondary | ICD-10-CM

## 2024-05-06 DIAGNOSIS — R2689 Other abnormalities of gait and mobility: Secondary | ICD-10-CM
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
## 2024-05-08 ENCOUNTER — Ambulatory Visit

## 2024-05-08 DIAGNOSIS — R2689 Other abnormalities of gait and mobility: Secondary | ICD-10-CM

## 2024-05-08 DIAGNOSIS — M5459 Other low back pain: Secondary | ICD-10-CM

## 2024-05-08 DIAGNOSIS — M5416 Radiculopathy, lumbar region: Secondary | ICD-10-CM

## 2024-05-08 DIAGNOSIS — M6281 Muscle weakness (generalized): Secondary | ICD-10-CM
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
## 2024-05-13 ENCOUNTER — Ambulatory Visit

## 2024-05-13 DIAGNOSIS — M6281 Muscle weakness (generalized): Secondary | ICD-10-CM

## 2024-05-13 DIAGNOSIS — M5416 Radiculopathy, lumbar region: Secondary | ICD-10-CM

## 2024-05-13 DIAGNOSIS — M5459 Other low back pain: Secondary | ICD-10-CM

## 2024-05-13 DIAGNOSIS — R2689 Other abnormalities of gait and mobility: Secondary | ICD-10-CM
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
## 2024-05-15 ENCOUNTER — Ambulatory Visit

## 2024-05-16 ENCOUNTER — Ambulatory Visit

## 2024-05-16 DIAGNOSIS — M6281 Muscle weakness (generalized): Secondary | ICD-10-CM

## 2024-05-16 DIAGNOSIS — M5416 Radiculopathy, lumbar region: Secondary | ICD-10-CM

## 2024-05-16 DIAGNOSIS — M5459 Other low back pain: Secondary | ICD-10-CM | POA: Diagnosis not present

## 2024-05-16 DIAGNOSIS — R2689 Other abnormalities of gait and mobility: Secondary | ICD-10-CM
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
## 2024-05-21 ENCOUNTER — Encounter

## 2024-05-22 ENCOUNTER — Ambulatory Visit: Attending: Cardiology

## 2024-05-22 DIAGNOSIS — M5459 Other low back pain: Secondary | ICD-10-CM | POA: Insufficient documentation

## 2024-05-22 DIAGNOSIS — M5416 Radiculopathy, lumbar region: Secondary | ICD-10-CM | POA: Diagnosis present

## 2024-05-22 DIAGNOSIS — R2689 Other abnormalities of gait and mobility: Secondary | ICD-10-CM | POA: Diagnosis present

## 2024-05-22 DIAGNOSIS — M6281 Muscle weakness (generalized): Secondary | ICD-10-CM | POA: Diagnosis present
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
## 2024-05-23 ENCOUNTER — Ambulatory Visit

## 2024-05-27 ENCOUNTER — Other Ambulatory Visit: Payer: Self-pay | Admitting: Cardiology

## 2024-05-27 ENCOUNTER — Ambulatory Visit

## 2024-05-27 DIAGNOSIS — M5416 Radiculopathy, lumbar region: Secondary | ICD-10-CM

## 2024-05-27 DIAGNOSIS — R2689 Other abnormalities of gait and mobility: Secondary | ICD-10-CM | POA: Diagnosis not present

## 2024-05-27 DIAGNOSIS — M6281 Muscle weakness (generalized): Secondary | ICD-10-CM

## 2024-05-27 DIAGNOSIS — M5459 Other low back pain: Secondary | ICD-10-CM
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
## 2024-05-28 ENCOUNTER — Encounter

## 2024-05-29 ENCOUNTER — Ambulatory Visit

## 2024-05-29 DIAGNOSIS — R2689 Other abnormalities of gait and mobility: Secondary | ICD-10-CM

## 2024-05-29 DIAGNOSIS — M5416 Radiculopathy, lumbar region: Secondary | ICD-10-CM

## 2024-05-29 DIAGNOSIS — M6281 Muscle weakness (generalized): Secondary | ICD-10-CM

## 2024-05-29 DIAGNOSIS — M5459 Other low back pain: Secondary | ICD-10-CM
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
## 2024-05-30 ENCOUNTER — Encounter

## 2024-06-03 ENCOUNTER — Encounter: Payer: Self-pay | Admitting: Cardiology

## 2024-06-03 ENCOUNTER — Ambulatory Visit

## 2024-06-03 ENCOUNTER — Ambulatory Visit: Admitting: Cardiology

## 2024-06-03 ENCOUNTER — Ambulatory Visit (INDEPENDENT_AMBULATORY_CARE_PROVIDER_SITE_OTHER): Admitting: Cardiology

## 2024-06-03 VITALS — BP 112/84 | HR 106 | Ht 59.0 in | Wt 213.0 lb

## 2024-06-03 DIAGNOSIS — E66813 Obesity, class 3: Secondary | ICD-10-CM | POA: Diagnosis not present

## 2024-06-03 DIAGNOSIS — I10 Essential (primary) hypertension: Secondary | ICD-10-CM

## 2024-06-03 DIAGNOSIS — E559 Vitamin D deficiency, unspecified: Secondary | ICD-10-CM

## 2024-06-03 DIAGNOSIS — M5459 Other low back pain: Secondary | ICD-10-CM

## 2024-06-03 DIAGNOSIS — E782 Mixed hyperlipidemia: Secondary | ICD-10-CM

## 2024-06-03 DIAGNOSIS — M5416 Radiculopathy, lumbar region: Secondary | ICD-10-CM

## 2024-06-03 DIAGNOSIS — R2689 Other abnormalities of gait and mobility: Secondary | ICD-10-CM | POA: Diagnosis not present

## 2024-06-03 DIAGNOSIS — M6281 Muscle weakness (generalized): Secondary | ICD-10-CM

## 2024-06-03 DIAGNOSIS — Z131 Encounter for screening for diabetes mellitus: Secondary | ICD-10-CM

## 2024-06-03 DIAGNOSIS — Z1329 Encounter for screening for other suspected endocrine disorder: Secondary | ICD-10-CM

## 2024-06-03 NOTE — Progress Notes (Signed)
 Established Patient Office Visit  Subjective:  Patient ID: Alexandra Montes, female    DOB: January 07, 1976  Age: 48 y.o. MRN: [id number]  Chief Complaint  Patient presents with   Follow-up    3 month follow up    Patient in office for 3 month follow up. Patient doing well, no complaints today. Did not have blood work done.  Scheduled for colonoscopy in August 2025. Return for fasting lab work.    No other concerns at this time.   Past Medical History:  Diagnosis Date   Anxiety    Carpal tunnel syndrome    Cauliflower ear, right    DVT of deep femoral vein, left (HCC)    GERD (gastroesophageal reflux disease)    Headache    Hypertension    Ovarian cyst    Right    Past Surgical History:  Procedure Laterality Date   AMPUTATION FINGER     AMPUTATION TOE     NO PAST SURGERIES      Social History   Socioeconomic History   Marital status: Divorced    Spouse name: Not on file   Number of children: 0   Years of education: Not on file   Highest education level: GED or equivalent  Occupational History   Not on file  Tobacco Use   Smoking status: Never   Smokeless tobacco: Never  Vaping Use   Vaping status: Never Used  Substance and Sexual Activity   Alcohol use: No   Drug use: No   Sexual activity: Yes    Birth control/protection: None  Other Topics Concern   Not on file  Social History Narrative   Not on file   Social Drivers of Health   Financial Resource Strain: High Risk (03/27/2024)   Received from [facility]   Overall Financial Resource Strain (CARDIA)    Difficulty of Paying Living Expenses: Very hard  Food Insecurity: Food Insecurity Present (03/27/2024)   Received from [facility]   Hunger Vital Sign    Within the past 12 months, you worried that your food would run out before you got the money to buy more.: Sometimes true    Within the past 12 months, the food you bought just didn't last and you didn't have money to  get more.: Sometimes true  Transportation Needs: No Transportation Needs (03/27/2024)   Received from [facility] - Transportation    In the past 12 months, has lack of transportation kept you from medical appointments or from getting medications?: No    Lack of Transportation (Non-Medical): No  Physical Activity: Inactive (11/20/2017)   Exercise Vital Sign    Days of Exercise per Week: 0 days    Minutes of Exercise per Session: 0 min  Stress: Not on file  Social Connections: Moderately Integrated (03/11/2024)   Social Connection and Isolation Panel    Frequency of Communication with Friends and Family: More than three times a week    Frequency of Social Gatherings with Friends and Family: More than three times a week    Attends Religious Services: 1 to 4 times per year    Active Member of [employer] or Organizations: Yes    Attends Banker Meetings: 1 to 4 times per year    Marital Status: Separated  Recent Concern: Social Connections - Socially Isolated (02/29/2024)   Social Connection and Isolation Panel    Frequency of Communication with Friends and Family:  More than three times a week    Frequency of Social Gatherings with Friends and Family: More than three times a week    Attends Religious Services: Never    Database administrator or Organizations: No    Attends Banker Meetings: Never    Marital Status: Separated  Intimate Partner Violence: Not At Risk (03/11/2024)   Humiliation, Afraid, Rape, and Kick questionnaire    Fear of Current or Ex-Partner: No    Emotionally Abused: No    Physically Abused: No    Sexually Abused: No    Family History  Problem Relation Age of Onset   Heart disease Mother    Bipolar disorder Mother    Drug abuse Mother    Alcohol abuse Mother    Breast cancer Mother 22   Schizophrenia Father    Lung cancer Maternal Grandfather     No Known Allergies  Outpatient Medications Prior to Visit   Medication Sig   acetaminophen  (TYLENOL ) 325 MG tablet Take 650 mg by mouth 4 (four) times daily.   albuterol  (VENTOLIN  HFA) 108 (90 Base) MCG/ACT inhaler ProAir  HFA 108 (90 Base) MCG/ACT Inhalation Aerosol Solution QTY: 1 inhaler Days: 30 Refills: 1  Written: 02/27/18 Patient Instructions: Inhale 1 puff orally every 6 hours as needed for shortness of breath   ASPIRIN  LOW DOSE 81 MG chewable tablet Chew 1 tablet (81 mg total) by mouth daily.   citalopram  (CELEXA ) 10 MG tablet Take 1 tablet (10 mg total) by mouth daily.   ergocalciferol  (VITAMIN D2) 1.25 MG (50000 UT) capsule Ergocalciferol  1.25 MG (50000 UT) Oral Capsule QTY: 12 capsule Days: 84 Refills: 3  Written: 03/14/19 Patient Instructions: Take 1 capsule by mouth WEEKLY   fluticasone (FLONASE) 50 MCG/ACT nasal spray Fluticasone Propionate 50 MCG/ACT Nasal Suspension QTY: 1 bottle Days: 30 Refills: 4  Written: 02/19/18 Patient Instructions: Lean head forward, instill 1 spray in each nostril daily with a gentle sniff   gabapentin  (NEURONTIN ) 100 MG capsule Take 100 mg by mouth 2 (two) times daily as needed (Nerve Pain).   loratadine  (CLARITIN ) 10 MG tablet Take 1 tablet (10 mg total) by mouth daily.   nortriptyline (PAMELOR) 10 MG capsule Take 10 mg by mouth.   rosuvastatin  (CRESTOR ) 20 MG tablet Take 1 tablet (20 mg total) by mouth daily.   spironolactone  (ALDACTONE ) 25 MG tablet Take 1 tablet (25 mg total) by mouth daily.   SUMAtriptan  (IMITREX ) 50 MG tablet Take 50 mg by mouth 2 (two) times daily as needed.   triamcinolone (KENALOG) 0.025 % cream Apply 1 Application topically daily as needed (rash).   baclofen  (LIORESAL ) 10 MG tablet Take 1 tablet by mouth once daily   butalbital -acetaminophen -caffeine  (FIORICET ) 50-325-40 MG tablet Take 1 tablet by mouth every 6 (six) hours as needed for headache.   [DISCONTINUED] baclofen  (LIORESAL ) 10 MG tablet Take 10 mg by mouth 3 (three) times daily. (Patient not taking: Reported on 06/03/2024)   No  facility-administered medications prior to visit.    Review of Systems  Constitutional: Negative.   HENT: Negative.    Eyes: Negative.   Respiratory: Negative.  Negative for shortness of breath.   Cardiovascular: Negative.  Negative for chest pain.  Gastrointestinal: Negative.  Negative for abdominal pain, constipation and diarrhea.  Genitourinary: Negative.   Musculoskeletal:  Negative for joint pain and myalgias.  Skin: Negative.   Neurological: Negative.  Negative for dizziness and headaches.  Endo/Heme/Allergies: Negative.   All other systems reviewed and are  negative.      Objective:   BP 112/84   Pulse (!) 106   Ht 4' 11 (1.499 m)   Wt 213 lb (96.6 kg)   SpO2 98%   BMI 43.02 kg/m   Vitals:   06/03/24 1400  BP: 112/84  Pulse: (!) 106  Height: 4' 11 (1.499 m)  Weight: 213 lb (96.6 kg)  SpO2: 98%  BMI (Calculated): 43    Physical Exam Vitals and nursing note reviewed.  Constitutional:      Appearance: Normal appearance. She is normal weight.  HENT:     Head: Normocephalic and atraumatic.     Nose: Nose normal.     Mouth/Throat:     Mouth: Mucous membranes are moist.  Eyes:     Extraocular Movements: Extraocular movements intact.     Conjunctiva/sclera: Conjunctivae normal.     Pupils: Pupils are equal, round, and reactive to light.  Cardiovascular:     Rate and Rhythm: Normal rate and regular rhythm.     Pulses: Normal pulses.     Heart sounds: Normal heart sounds.  Pulmonary:     Effort: Pulmonary effort is normal.     Breath sounds: Normal breath sounds.  Abdominal:     General: Abdomen is flat. Bowel sounds are normal.     Palpations: Abdomen is soft.  Musculoskeletal:        General: Normal range of motion.     Cervical back: Normal range of motion.  Skin:    General: Skin is warm and dry.  Neurological:     General: No focal deficit present.     Mental Status: She is alert and oriented to person, place, and time.  Psychiatric:         Mood and Affect: Mood normal.        Behavior: Behavior normal.        Thought Content: Thought content normal.        Judgment: Judgment normal.      No results found for any visits on 06/03/24.  Recent Results (from the past 2160 hours)  Basic metabolic panel     Status: Abnormal   Collection Time: 03/10/24  9:12 PM  Result Value Ref Range   Sodium 136 135 - 145 mmol/L   Potassium 2.4 (LL) 3.5 - 5.1 mmol/L    Comment: CRITICAL RESULT CALLED TO, READ BACK BY AND VERIFIED WITH IZETTA BARRIO RN @ 2135 03/10/24 [facility]    Chloride 100 98 - 111 mmol/L   CO2 23 22 - 32 mmol/L   Glucose, Bld 118 (H) 70 - 99 mg/dL    Comment: Glucose reference range applies only to samples taken after fasting for at least 8 hours.   BUN 15 6 - 20 mg/dL   Creatinine, Ser 9.35 0.44 - 1.00 mg/dL   Calcium  8.9 8.9 - 10.3 mg/dL   GFR, Estimated >39 >39 mL/min    Comment: (NOTE) Calculated using the CKD-EPI Creatinine Equation (2021)    Anion gap 13 5 - 15    Comment: Performed at [facility], [address] [place], [place] [address]  CBC     Status: Abnormal   Collection Time: 03/10/24  9:12 PM  Result Value Ref Range   WBC 7.5 4.0 - 10.5 K/uL   RBC 3.88 3.87 - 5.11 MIL/uL   Hemoglobin 11.4 (L) 12.0 - 15.0 g/dL   HCT 65.9 (L) 63.9 - 53.9 %   MCV 87.6 80.0 - 100.0 fL  MCH 29.4 26.0 - 34.0 pg   MCHC 33.5 30.0 - 36.0 g/dL   RDW 86.7 88.4 - 84.4 %   Platelets 218 150 - 400 K/uL   nRBC 0.0 0.0 - 0.2 %    Comment: Performed at [facility], [address] [place], [place] [address]  Hepatic function panel     Status: None   Collection Time: 03/10/24  9:12 PM  Result Value Ref Range   Total Protein 7.7 6.5 - 8.1 g/dL   Albumin 3.6 3.5 - 5.0 g/dL   AST 21 15 - 41 U/L   ALT 18 0 - 44 U/L   Alkaline Phosphatase 61 38 - 126 U/L   Total Bilirubin 0.8 0.0 - 1.2 mg/dL   Bilirubin, Direct <9.8 0.0 - 0.2 mg/dL   Indirect Bilirubin NOT CALCULATED 0.3 - 0.9 mg/dL    Comment: Performed  at [facility], [address] [place], [place] [address]  Magnesium      Status: None   Collection Time: 03/10/24  9:12 PM  Result Value Ref Range   Magnesium  1.9 1.7 - 2.4 mg/dL    Comment: Performed at [facility], [address] [place], [place] [address]  CBC     Status: Abnormal   Collection Time: 03/11/24 12:12 AM  Result Value Ref Range   WBC 6.8 4.0 - 10.5 K/uL   RBC 3.99 3.87 - 5.11 MIL/uL   Hemoglobin 11.6 (L) 12.0 - 15.0 g/dL   HCT 64.2 (L) 63.9 - 53.9 %   MCV 89.5 80.0 - 100.0 fL   MCH 29.1 26.0 - 34.0 pg   MCHC 32.5 30.0 - 36.0 g/dL   RDW 86.9 88.4 - 84.4 %   Platelets 209 150 - 400 K/uL   nRBC 0.0 0.0 - 0.2 %    Comment: Performed at [facility], [address] [place], [place] [address]  Creatinine, serum     Status: None   Collection Time: 03/11/24 12:12 AM  Result Value Ref Range   Creatinine, Ser 0.59 0.44 - 1.00 mg/dL   GFR, Estimated >39 >39 mL/min    Comment: (NOTE) Calculated using the CKD-EPI Creatinine Equation (2021) Performed at [facility], [address] [place], [place] [address]   Basic metabolic panel with GFR     Status: Abnormal   Collection Time: 03/11/24  5:22 AM  Result Value Ref Range   Sodium 134 (L) 135 - 145 mmol/L   Potassium 3.7 3.5 - 5.1 mmol/L   Chloride 102 98 - 111 mmol/L   CO2 24 22 - 32 mmol/L   Glucose, Bld 125 (H) 70 - 99 mg/dL    Comment: Glucose reference range applies only to samples taken after fasting for at least 8 hours.   BUN 9 6 - 20 mg/dL   Creatinine, Ser 9.43 0.44 - 1.00 mg/dL   Calcium  8.3 (L) 8.9 - 10.3 mg/dL   GFR, Estimated >39 >39 mL/min    Comment: (NOTE) Calculated using the CKD-EPI Creatinine Equation (2021)    Anion gap 8 5 - 15    Comment: Performed at [facility], [address] [place], [place] [address]  Basic metabolic panel with GFR     Status: Abnormal   Collection Time: 03/11/24 11:05 AM  Result Value Ref Range   Sodium 136 135 -  145 mmol/L   Potassium 3.6 3.5 - 5.1 mmol/L   Chloride 103 98 - 111 mmol/L   CO2 27 22 -  32 mmol/L   Glucose, Bld 103 (H) 70 - 99 mg/dL    Comment: Glucose reference range applies only to samples taken after fasting for at least 8 hours.   BUN 8 6 - 20 mg/dL   Creatinine, Ser 9.20 0.44 - 1.00 mg/dL   Calcium  8.6 (L) 8.9 - 10.3 mg/dL   GFR, Estimated >39 >39 mL/min    Comment: (NOTE) Calculated using the CKD-EPI Creatinine Equation (2021)    Anion gap 6 5 - 15    Comment: Performed at [facility], [address] [place], [place] [address]  CBC with Differential     Status: Abnormal   Collection Time: 03/13/24  2:01 AM  Result Value Ref Range   WBC 6.6 4.0 - 10.5 K/uL   RBC 3.89 3.87 - 5.11 MIL/uL   Hemoglobin 11.3 (L) 12.0 - 15.0 g/dL   HCT 64.1 (L) 63.9 - 53.9 %   MCV 92.0 80.0 - 100.0 fL   MCH 29.0 26.0 - 34.0 pg   MCHC 31.6 30.0 - 36.0 g/dL   RDW 86.3 88.4 - 84.4 %   Platelets 229 150 - 400 K/uL   nRBC 0.0 0.0 - 0.2 %   Neutrophils Relative % 45 %   Neutro Abs 3.0 1.7 - 7.7 K/uL   Lymphocytes Relative 44 %   Lymphs Abs 2.9 0.7 - 4.0 K/uL   Monocytes Relative 9 %   Monocytes Absolute 0.6 0.1 - 1.0 K/uL   Eosinophils Relative 1 %   Eosinophils Absolute 0.1 0.0 - 0.5 K/uL   Basophils Relative 1 %   Basophils Absolute 0.0 0.0 - 0.1 K/uL   Immature Granulocytes 0 %   Abs Immature Granulocytes 0.02 0.00 - 0.07 K/uL    Comment: Performed at [facility], [address] [place], [place] [address]  Comprehensive metabolic panel     Status: Abnormal   Collection Time: 03/13/24  2:01 AM  Result Value Ref Range   Sodium 135 135 - 145 mmol/L   Potassium 3.3 (L) 3.5 - 5.1 mmol/L   Chloride 104 98 - 111 mmol/L   CO2 21 (L) 22 - 32 mmol/L   Glucose, Bld 117 (H) 70 - 99 mg/dL    Comment: Glucose reference range applies only to samples taken after fasting for at least 8 hours.   BUN 16 6 - 20 mg/dL   Creatinine, Ser 9.19 0.44 - 1.00 mg/dL   Calcium  8.6 (L)  8.9 - 10.3 mg/dL   Total Protein 7.3 6.5 - 8.1 g/dL   Albumin 3.4 (L) 3.5 - 5.0 g/dL   AST 17 15 - 41 U/L   ALT 14 0 - 44 U/L   Alkaline Phosphatase 56 38 - 126 U/L   Total Bilirubin 0.5 0.0 - 1.2 mg/dL   GFR, Estimated >39 >39 mL/min    Comment: (NOTE) Calculated using the CKD-EPI Creatinine Equation (2021)    Anion gap 10 5 - 15    Comment: Performed at [facility], [address] [place], [place] [address]  Magnesium      Status: None   Collection Time: 03/13/24  2:01 AM  Result Value Ref Range   Magnesium  2.0 1.7 - 2.4 mg/dL    Comment: Performed at [facility], [address] [place], [place] [address]  hCG, quantitative, pregnancy     Status: None   Collection Time: 03/13/24  2:06 AM  Result Value Ref Range   hCG, Beta Chain, Quant, S <1 <5  mIU/mL    Comment:          GEST. AGE      CONC.  (mIU/mL)   <=1 WEEK        5 - 50     2 WEEKS       50 - 500     3 WEEKS       100 - 10,000     4 WEEKS     1,000 - 30,000     5 WEEKS     3,500 - 115,000   6-8 WEEKS     12,000 - 270,000    12 WEEKS     15,000 - 220,000        FEMALE AND NON-PREGNANT FEMALE:     LESS THAN 5 mIU/mL Performed at [facility], [address] [place], [place] [address]   Basic metabolic panel     Status: Abnormal   Collection Time: 04/28/24  2:34 PM  Result Value Ref Range   Sodium 137 135 - 145 mmol/L   Potassium 4.1 3.5 - 5.1 mmol/L   Chloride 104 98 - 111 mmol/L   CO2 25 22 - 32 mmol/L   Glucose, Bld 84 70 - 99 mg/dL    Comment: Glucose reference range applies only to samples taken after fasting for at least 8 hours.   BUN 12 6 - 20 mg/dL   Creatinine, Ser 9.27 0.44 - 1.00 mg/dL   Calcium  8.7 (L) 8.9 - 10.3 mg/dL   GFR, Estimated >39 >39 mL/min    Comment: (NOTE) Calculated using the CKD-EPI Creatinine Equation (2021)    Anion gap 8 5 - 15    Comment: Performed at [facility], [address] [place], [place] [address]  CBC     Status: Abnormal    Collection Time: 04/28/24  2:34 PM  Result Value Ref Range   WBC 5.4 4.0 - 10.5 K/uL   RBC 3.96 3.87 - 5.11 MIL/uL   Hemoglobin 11.1 (L) 12.0 - 15.0 g/dL   HCT 64.7 (L) 63.9 - 53.9 %   MCV 88.9 80.0 - 100.0 fL   MCH 28.0 26.0 - 34.0 pg   MCHC 31.5 30.0 - 36.0 g/dL   RDW 86.3 88.4 - 84.4 %   Platelets 210 150 - 400 K/uL   nRBC 0.0 0.0 - 0.2 %    Comment: Performed at [facility], [address] [place], [place] [address]  BNP (Order if Patient has history of Heart Failure)     Status: None   Collection Time: 04/28/24  2:34 PM  Result Value Ref Range   B Natriuretic Peptide 14.2 0.0 - 100.0 pg/mL    Comment: Performed at [facility], [address] [place], [place] [address]      Assessment & Plan:  Return for fasting lab work.   Problem List Items Addressed This Visit       Cardiovascular and Mediastinum   Essential hypertension   Relevant Orders   CMP14+EGFR     Other   Vitamin D  deficiency   Relevant Orders   Vitamin D  (25 hydroxy)   Obesity, Class III, BMI 40-49.9 (morbid obesity)   Mixed hyperlipidemia - Primary   Relevant Orders   Lipid Profile   Other Visit Diagnoses       Thyroid disorder screening       Relevant Orders   TSH     Diabetes mellitus screening  Relevant Orders   CMP14+EGFR   Hemoglobin A1c       Return in about 4 months (around 10/04/2024) for fasting labs prior.   Total time spent: 25 minutes  [employer], NP  06/03/2024   This document may have been prepared by Dragon Voice Recognition software and as such may include unintentional dictation errors.
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
## 2024-06-04 ENCOUNTER — Encounter

## 2024-06-06 ENCOUNTER — Encounter

## 2024-06-06 ENCOUNTER — Other Ambulatory Visit

## 2024-06-06 DIAGNOSIS — I10 Essential (primary) hypertension: Secondary | ICD-10-CM

## 2024-06-06 DIAGNOSIS — Z1329 Encounter for screening for other suspected endocrine disorder: Secondary | ICD-10-CM

## 2024-06-06 DIAGNOSIS — E782 Mixed hyperlipidemia: Secondary | ICD-10-CM

## 2024-06-06 DIAGNOSIS — Z131 Encounter for screening for diabetes mellitus: Secondary | ICD-10-CM

## 2024-06-06 DIAGNOSIS — E559 Vitamin D deficiency, unspecified: Secondary | ICD-10-CM

## 2024-06-07 LAB — HEMOGLOBIN A1C
Est. average glucose Bld gHb Est-mCnc: 88 mg/dL
Hgb A1c MFr Bld: 4.7 % — ABNORMAL LOW (ref 4.8–5.6)

## 2024-06-07 LAB — LIPID PANEL
Chol/HDL Ratio: 2.6 ratio (ref 0.0–4.4)
Cholesterol, Total: 111 mg/dL (ref 100–199)
HDL: 42 mg/dL (ref >39)
LDL Chol Calc (NIH): 57 mg/dL (ref 0–99)
Triglycerides: 54 mg/dL (ref 0–149)
VLDL Cholesterol Cal: 12 mg/dL (ref 5–40)

## 2024-06-07 LAB — CMP14+EGFR
ALT: 16 IU/L (ref 0–32)
AST: 21 IU/L (ref 0–40)
Albumin: 3.7 g/dL — ABNORMAL LOW (ref 3.9–4.9)
Alkaline Phosphatase: 87 IU/L (ref 44–121)
BUN/Creatinine Ratio: 18 (ref 9–23)
BUN: 14 mg/dL (ref 6–24)
Bilirubin Total: 0.5 mg/dL (ref 0.0–1.2)
CO2: 22 mmol/L (ref 20–29)
Calcium: 9 mg/dL (ref 8.7–10.2)
Chloride: 104 mmol/L (ref 96–106)
Creatinine, Ser: 0.78 mg/dL (ref 0.57–1.00)
Globulin, Total: 3.2 g/dL (ref 1.5–4.5)
Glucose: 91 mg/dL (ref 70–99)
Potassium: 4.5 mmol/L (ref 3.5–5.2)
Sodium: 139 mmol/L (ref 134–144)
Total Protein: 6.9 g/dL (ref 6.0–8.5)
eGFR: 92 mL/min/1.73 (ref >59)

## 2024-06-07 LAB — VITAMIN D 25 HYDROXY (VIT D DEFICIENCY, FRACTURES): Vit D, 25-Hydroxy: 39.5 ng/mL (ref 30.0–100.0)

## 2024-06-07 LAB — TSH: TSH: 4.45 u[IU]/mL (ref 0.450–4.500)

## 2024-06-11 ENCOUNTER — Encounter

## 2024-06-12 ENCOUNTER — Ambulatory Visit

## 2024-06-12 DIAGNOSIS — R2689 Other abnormalities of gait and mobility: Secondary | ICD-10-CM | POA: Diagnosis not present

## 2024-06-12 DIAGNOSIS — M6281 Muscle weakness (generalized): Secondary | ICD-10-CM

## 2024-06-12 DIAGNOSIS — M5416 Radiculopathy, lumbar region: Secondary | ICD-10-CM

## 2024-06-12 DIAGNOSIS — M5459 Other low back pain: Secondary | ICD-10-CM
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
## 2024-06-13 ENCOUNTER — Encounter

## 2024-06-17 ENCOUNTER — Ambulatory Visit: Payer: Self-pay | Admitting: Cardiology

## 2024-06-17 ENCOUNTER — Ambulatory Visit

## 2024-06-17 DIAGNOSIS — M5459 Other low back pain: Secondary | ICD-10-CM

## 2024-06-17 DIAGNOSIS — R2689 Other abnormalities of gait and mobility: Secondary | ICD-10-CM

## 2024-06-17 DIAGNOSIS — M6281 Muscle weakness (generalized): Secondary | ICD-10-CM

## 2024-06-17 DIAGNOSIS — M5416 Radiculopathy, lumbar region: Secondary | ICD-10-CM
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
## 2024-06-18 ENCOUNTER — Encounter

## 2024-06-19 ENCOUNTER — Ambulatory Visit

## 2024-06-26 ENCOUNTER — Other Ambulatory Visit: Payer: Self-pay | Admitting: Cardiology

## 2024-06-26 ENCOUNTER — Ambulatory Visit: Attending: Cardiology

## 2024-06-26 DIAGNOSIS — M5459 Other low back pain: Secondary | ICD-10-CM | POA: Insufficient documentation

## 2024-06-26 DIAGNOSIS — R2689 Other abnormalities of gait and mobility: Secondary | ICD-10-CM | POA: Diagnosis present

## 2024-06-26 DIAGNOSIS — M5416 Radiculopathy, lumbar region: Secondary | ICD-10-CM | POA: Insufficient documentation

## 2024-06-26 DIAGNOSIS — M6281 Muscle weakness (generalized): Secondary | ICD-10-CM | POA: Diagnosis present
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
## 2024-07-03 ENCOUNTER — Ambulatory Visit

## 2024-07-03 DIAGNOSIS — M5416 Radiculopathy, lumbar region: Secondary | ICD-10-CM

## 2024-07-03 DIAGNOSIS — M6281 Muscle weakness (generalized): Secondary | ICD-10-CM

## 2024-07-03 DIAGNOSIS — R2689 Other abnormalities of gait and mobility: Secondary | ICD-10-CM

## 2024-07-03 DIAGNOSIS — M5459 Other low back pain: Secondary | ICD-10-CM
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
## 2024-07-08 ENCOUNTER — Ambulatory Visit: Payer: Self-pay | Admitting: Cardiology

## 2024-07-08 ENCOUNTER — Ambulatory Visit (INDEPENDENT_AMBULATORY_CARE_PROVIDER_SITE_OTHER): Admitting: Cardiology

## 2024-07-08 ENCOUNTER — Encounter: Payer: Self-pay | Admitting: Cardiology

## 2024-07-08 VITALS — BP 110/82 | HR 95 | Ht 59.0 in | Wt 212.0 lb

## 2024-07-08 DIAGNOSIS — J301 Allergic rhinitis due to pollen: Secondary | ICD-10-CM

## 2024-07-08 DIAGNOSIS — J029 Acute pharyngitis, unspecified: Secondary | ICD-10-CM | POA: Insufficient documentation

## 2024-07-08 DIAGNOSIS — Z0130 Encounter for examination of blood pressure without abnormal findings: Secondary | ICD-10-CM

## 2024-07-08 LAB — POCT RAPID STREP A (OFFICE): Rapid Strep A Screen: NEGATIVE

## 2024-07-08 NOTE — Progress Notes (Signed)
 Established Patient Office Visit  Subjective:  Patient ID: Alexandra Montes, female    DOB: 09-Dec-1975  Age: 48 y.o. MRN: [id number]  Chief Complaint  Patient presents with   Acute Visit    Cough, sore throat started Friday of last week.    Patient in office for an acute visit, complaining of cough, congestion and sore throat. Denies headache or sinus pain. Did not take any OTC medications. Strep swab negative today. Likely caused from PND. Recommend antihistamine and nasal spray.   Sore Throat  This is a new problem. The current episode started in the past 7 days. The problem has been unchanged. Neither side of throat is experiencing more pain than the other. There has been no fever. Associated symptoms include congestion and coughing. Pertinent negatives include no abdominal pain, diarrhea, headaches or shortness of breath. She has tried nothing for the symptoms. The treatment provided no relief.    No other concerns at this time.   Past Medical History:  Diagnosis Date   Anxiety    Carpal tunnel syndrome    Cauliflower ear, right    DVT of deep femoral vein, left (HCC)    GERD (gastroesophageal reflux disease)    Headache    Hypertension    Ovarian cyst    Right    Past Surgical History:  Procedure Laterality Date   AMPUTATION FINGER     AMPUTATION TOE     NO PAST SURGERIES      Social History   Socioeconomic History   Marital status: Divorced    Spouse name: Not on file   Number of children: 0   Years of education: Not on file   Highest education level: GED or equivalent  Occupational History   Not on file  Tobacco Use   Smoking status: Never   Smokeless tobacco: Never  Vaping Use   Vaping status: Never Used  Substance and Sexual Activity   Alcohol use: No   Drug use: No   Sexual activity: Yes    Birth control/protection: None  Other Topics Concern   Not on file  Social History Narrative   Not on file   Social Drivers of Health   Financial Resource  Strain: High Risk (03/27/2024)   Received from [facility]   Overall Financial Resource Strain (CARDIA)    Difficulty of Paying Living Expenses: Very hard  Food Insecurity: Food Insecurity Present (03/27/2024)   Received from [facility]   Hunger Vital Sign    Within the past 12 months, you worried that your food would run out before you got the money to buy more.: Sometimes true    Within the past 12 months, the food you bought just didn't last and you didn't have money to get more.: Sometimes true  Transportation Needs: No Transportation Needs (03/27/2024)   Received from [facility] Transportation    In the past 12 months, has lack of transportation kept you from medical appointments or from getting medications?: No    Lack of Transportation (Non-Medical): No  Physical Activity: Inactive (11/20/2017)   Exercise Vital Sign    Days of Exercise per Week: 0 days    Minutes of Exercise per Session: 0 min  Stress: Not on file  Social Connections: Moderately Integrated (03/11/2024)   Social Connection and Isolation Panel    Frequency of Communication with Friends and Family: More than three times a week    Frequency  of Social Gatherings with Friends and Family: More than three times a week    Attends Religious Services: 1 to 4 times per year    Active Member of [employer] or Organizations: Yes    Attends Banker Meetings: 1 to 4 times per year    Marital Status: Separated  Recent Concern: Social Connections - Socially Isolated (02/29/2024)   Social Connection and Isolation Panel    Frequency of Communication with Friends and Family: More than three times a week    Frequency of Social Gatherings with Friends and Family: More than three times a week    Attends Religious Services: Never    Database administrator or Organizations: No    Attends Banker Meetings: Never    Marital Status: Separated  Intimate  Partner Violence: Not At Risk (03/11/2024)   Humiliation, Afraid, Rape, and Kick questionnaire    Fear of Current or Ex-Partner: No    Emotionally Abused: No    Physically Abused: No    Sexually Abused: No    Family History  Problem Relation Age of Onset   Heart disease Mother    Bipolar disorder Mother    Drug abuse Mother    Alcohol abuse Mother    Breast cancer Mother 23   Schizophrenia Father    Lung cancer Maternal Grandfather     No Known Allergies  Outpatient Medications Prior to Visit  Medication Sig   acetaminophen  (TYLENOL ) 325 MG tablet Take 650 mg by mouth 4 (four) times daily.   albuterol  (VENTOLIN  HFA) 108 (90 Base) MCG/ACT inhaler ProAir  HFA 108 (90 Base) MCG/ACT Inhalation Aerosol Solution QTY: 1 inhaler Days: 30 Refills: 1  Written: 02/27/18 Patient Instructions: Inhale 1 puff orally every 6 hours as needed for shortness of breath   ASPIRIN  LOW DOSE 81 MG chewable tablet Chew 1 tablet (81 mg total) by mouth daily.   baclofen  (LIORESAL ) 10 MG tablet Take 1 tablet by mouth once daily   butalbital -acetaminophen -caffeine  (FIORICET ) 50-325-40 MG tablet Take 1 tablet by mouth every 6 (six) hours as needed for headache.   citalopram  (CELEXA ) 10 MG tablet Take 1 tablet (10 mg total) by mouth daily.   ergocalciferol  (VITAMIN D2) 1.25 MG (50000 UT) capsule Ergocalciferol  1.25 MG (50000 UT) Oral Capsule QTY: 12 capsule Days: 84 Refills: 3  Written: 03/14/19 Patient Instructions: Take 1 capsule by mouth WEEKLY   gabapentin  (NEURONTIN ) 100 MG capsule Take 100 mg by mouth 2 (two) times daily as needed (Nerve Pain).   loratadine  (CLARITIN ) 10 MG tablet Take 1 tablet (10 mg total) by mouth daily.   nortriptyline (PAMELOR) 10 MG capsule Take 10 mg by mouth.   rosuvastatin  (CRESTOR ) 20 MG tablet Take 1 tablet (20 mg total) by mouth daily.   spironolactone  (ALDACTONE ) 25 MG tablet Take 1 tablet (25 mg total) by mouth daily.   SUMAtriptan  (IMITREX ) 50 MG tablet Take 50 mg by mouth 2  (two) times daily as needed.   triamcinolone (KENALOG) 0.025 % cream Apply 1 Application topically daily as needed (rash).   fluticasone (FLONASE) 50 MCG/ACT nasal spray Fluticasone Propionate 50 MCG/ACT Nasal Suspension QTY: 1 bottle Days: 30 Refills: 4  Written: 02/19/18 Patient Instructions: Lean head forward, instill 1 spray in each nostril daily with a gentle sniff (Patient not taking: Reported on 07/08/2024)   No facility-administered medications prior to visit.    Review of Systems  Constitutional: Negative.   HENT:  Positive for congestion and sore throat. Negative for  sinus pain.   Eyes: Negative.   Respiratory:  Positive for cough. Negative for shortness of breath.   Cardiovascular: Negative.  Negative for chest pain.  Gastrointestinal: Negative.  Negative for abdominal pain, constipation and diarrhea.  Genitourinary: Negative.   Musculoskeletal:  Negative for joint pain and myalgias.  Skin: Negative.   Neurological: Negative.  Negative for dizziness and headaches.  Endo/Heme/Allergies: Negative.   All other systems reviewed and are negative.      Objective:   BP 110/82   Pulse 95   Ht 4' 11 (1.499 m)   Wt 212 lb (96.2 kg)   SpO2 96%   BMI 42.82 kg/m   Vitals:   07/08/24 1302  BP: 110/82  Pulse: 95  Height: 4' 11 (1.499 m)  Weight: 212 lb (96.2 kg)  SpO2: 96%  BMI (Calculated): 42.8    Physical Exam Vitals and nursing note reviewed.  Constitutional:      Appearance: Normal appearance. She is normal weight.  HENT:     Head: Normocephalic and atraumatic.     Nose: Nose normal.     Mouth/Throat:     Mouth: Mucous membranes are moist.     Pharynx: Oropharynx is clear. Uvula midline. No pharyngeal swelling, oropharyngeal exudate, posterior oropharyngeal erythema, uvula swelling or postnasal drip.     Tonsils: No tonsillar exudate or tonsillar abscesses.  Eyes:     Extraocular Movements: Extraocular movements intact.     Conjunctiva/sclera: Conjunctivae  normal.     Pupils: Pupils are equal, round, and reactive to light.  Cardiovascular:     Rate and Rhythm: Normal rate and regular rhythm.     Pulses: Normal pulses.     Heart sounds: Normal heart sounds.  Pulmonary:     Effort: Pulmonary effort is normal.     Breath sounds: Normal breath sounds.  Abdominal:     General: Abdomen is flat. Bowel sounds are normal.     Palpations: Abdomen is soft.  Musculoskeletal:        General: Normal range of motion.     Cervical back: Normal range of motion.  Skin:    General: Skin is warm and dry.  Neurological:     General: No focal deficit present.     Mental Status: She is alert and oriented to person, place, and time.  Psychiatric:        Mood and Affect: Mood normal.        Behavior: Behavior normal.        Thought Content: Thought content normal.        Judgment: Judgment normal.      Results for orders placed or performed in visit on 07/08/24  POCT rapid strep A  Result Value Ref Range   Rapid Strep A Screen Negative Negative    Recent Results (from the past 2160 hours)  Basic metabolic panel     Status: Abnormal   Collection Time: 04/28/24  2:34 PM  Result Value Ref Range   Sodium 137 135 - 145 mmol/L   Potassium 4.1 3.5 - 5.1 mmol/L   Chloride 104 98 - 111 mmol/L   CO2 25 22 - 32 mmol/L   Glucose, Bld 84 70 - 99 mg/dL    Comment: Glucose reference range applies only to samples taken after fasting for at least 8 hours.   BUN 12 6 - 20 mg/dL   Creatinine, Ser 9.27 0.44 - 1.00 mg/dL   Calcium  8.7 (L) 8.9 - 10.3 mg/dL  GFR, Estimated >60 >60 mL/min    Comment: (NOTE) Calculated using the CKD-EPI Creatinine Equation (2021)    Anion gap 8 5 - 15    Comment: Performed at [facility], [address] [place], [place] [address]  CBC     Status: Abnormal   Collection Time: 04/28/24  2:34 PM  Result Value Ref Range   WBC 5.4 4.0 - 10.5 K/uL   RBC 3.96 3.87 - 5.11 MIL/uL   Hemoglobin 11.1 (L) 12.0 - 15.0 g/dL    HCT 64.7 (L) 63.9 - 46.0 %   MCV 88.9 80.0 - 100.0 fL   MCH 28.0 26.0 - 34.0 pg   MCHC 31.5 30.0 - 36.0 g/dL   RDW 86.3 88.4 - 84.4 %   Platelets 210 150 - 400 K/uL   nRBC 0.0 0.0 - 0.2 %    Comment: Performed at [facility], [address], [place], [place] [address]  BNP (Order if Patient has history of Heart Failure)     Status: None   Collection Time: 04/28/24  2:34 PM  Result Value Ref Range   B Natriuretic Peptide 14.2 0.0 - 100.0 pg/mL    Comment: Performed at [facility], [address] [place], [place] [address]  TSH     Status: None   Collection Time: 06/06/24  8:34 AM  Result Value Ref Range   TSH 4.450 0.450 - 4.500 uIU/mL  Hemoglobin A1c     Status: Abnormal   Collection Time: 06/06/24  8:34 AM  Result Value Ref Range   Hgb A1c MFr Bld 4.7 (L) 4.8 - 5.6 %    Comment:          Prediabetes: 5.7 - 6.4          Diabetes: >6.4          Glycemic control for adults with diabetes: <7.0    Est. average glucose Bld gHb Est-mCnc 88 mg/dL  Vitamin D  (25 hydroxy)     Status: None   Collection Time: 06/06/24  8:34 AM  Result Value Ref Range   Vit D, 25-Hydroxy 39.5 30.0 - 100.0 ng/mL    Comment: Vitamin D  deficiency has been defined by the Institute of Medicine and an Endocrine Society practice guideline as a level of serum 25-OH vitamin D  less than 20 ng/mL (1,2). The Endocrine Society went on to further define vitamin D  insufficiency as a level between 21 and 29 ng/mL (2). 1. IOM (Institute of Medicine). 2010. Dietary reference    intakes for calcium  and D. [place]: The    [employer]. 2. Holick MF, Binkley [place], Bischoff-Ferrari HA, et al.    Evaluation, treatment, and prevention of vitamin D     deficiency: an Endocrine Society clinical practice    guideline. JCEM. 2011 Jul; 96(7):1911-30.   CMP14+EGFR     Status: Abnormal   Collection Time: 06/06/24  8:34 AM  Result Value Ref Range   Glucose 91 70 - 99 mg/dL   BUN 14 6 - 24  mg/dL   Creatinine, Ser 9.21 0.57 - 1.00 mg/dL   eGFR 92 >40 fO/fpw/8.26   BUN/Creatinine Ratio 18 9 - 23   Sodium 139 134 - 144 mmol/L   Potassium 4.5 3.5 - 5.2 mmol/L   Chloride 104 96 - 106 mmol/L   CO2 22 20 - 29 mmol/L   Calcium  9.0 8.7 - 10.2 mg/dL   Total Protein 6.9 6.0 - 8.5 g/dL   Albumin 3.7 (L)  3.9 - 4.9 g/dL   Globulin, Total 3.2 1.5 - 4.5 g/dL   Bilirubin Total 0.5 0.0 - 1.2 mg/dL   Alkaline Phosphatase 87 44 - 121 IU/L   AST 21 0 - 40 IU/L   ALT 16 0 - 32 IU/L  Lipid Profile     Status: None   Collection Time: 06/06/24  8:34 AM  Result Value Ref Range   Cholesterol, Total 111 100 - 199 mg/dL   Triglycerides 54 0 - 149 mg/dL   HDL 42 >60 mg/dL   VLDL Cholesterol Cal 12 5 - 40 mg/dL   LDL Chol Calc (NIH) 57 0 - 99 mg/dL   Chol/HDL Ratio 2.6 0.0 - 4.4 ratio    Comment:                                   T. Chol/HDL Ratio                                             Men  Women                               1/2 Avg.Risk  3.4    3.3                                   Avg.Risk  5.0    4.4                                2X Avg.Risk  9.6    7.1                                3X Avg.Risk 23.4   11.0   POCT rapid strep A     Status: Normal   Collection Time: 07/08/24  1:22 PM  Result Value Ref Range   Rapid Strep A Screen Negative Negative      Assessment & Plan:  Antihistamine Nasal spray  Problem List Items Addressed This Visit       Respiratory   Allergic rhinitis - Primary     Other   Sore throat   Relevant Orders   POCT rapid strep A (Completed)    Return if symptoms worsen or fail to improve.   Total time spent: 25 minutes  [employer], NP  07/08/2024   This document may have been prepared by Dragon Voice Recognition software and as such may include unintentional dictation errors.

## 2024-07-09 ENCOUNTER — Ambulatory Visit

## 2024-07-11 ENCOUNTER — Ambulatory Visit

## 2024-07-11 DIAGNOSIS — M5416 Radiculopathy, lumbar region: Secondary | ICD-10-CM | POA: Diagnosis not present

## 2024-07-11 DIAGNOSIS — M6281 Muscle weakness (generalized): Secondary | ICD-10-CM

## 2024-07-11 DIAGNOSIS — M5459 Other low back pain: Secondary | ICD-10-CM
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
## 2024-07-16 ENCOUNTER — Ambulatory Visit
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
## 2024-07-18 ENCOUNTER — Ambulatory Visit

## 2024-07-23 ENCOUNTER — Ambulatory Visit: Attending: Cardiology

## 2024-07-23 DIAGNOSIS — R2689 Other abnormalities of gait and mobility: Secondary | ICD-10-CM | POA: Diagnosis present

## 2024-07-23 DIAGNOSIS — M5459 Other low back pain: Secondary | ICD-10-CM | POA: Diagnosis present

## 2024-07-23 DIAGNOSIS — M6281 Muscle weakness (generalized): Secondary | ICD-10-CM | POA: Diagnosis present

## 2024-07-23 DIAGNOSIS — M5416 Radiculopathy, lumbar region: Secondary | ICD-10-CM | POA: Diagnosis present
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
## 2024-07-25 ENCOUNTER — Ambulatory Visit

## 2024-07-27 ENCOUNTER — Other Ambulatory Visit: Payer: Self-pay | Admitting: Cardiology

## 2024-08-14 ENCOUNTER — Telehealth: Payer: Self-pay

## 2024-08-14 NOTE — Telephone Encounter (Signed)
 Pt is requesting a prescription for pain meds. Pt did not say why but said her lawyer said it needs to be a prescription. Please advise.

## 2024-08-15 ENCOUNTER — Telehealth: Payer: Self-pay

## 2024-08-15 ENCOUNTER — Other Ambulatory Visit: Payer: Self-pay | Admitting: Cardiology

## 2024-08-15 DIAGNOSIS — M47816 Spondylosis without myelopathy or radiculopathy, lumbar region: Secondary | ICD-10-CM

## 2024-08-15 NOTE — Telephone Encounter (Signed)
 Patients message yesterday was supposed to state a cane not a pain medication can you print out a prescription  and sign it, she's out front and going to take it with her, she needs it for the disability lawyer.

## 2024-08-22 ENCOUNTER — Other Ambulatory Visit: Payer: Self-pay | Admitting: Cardiology

## 2024-08-27 ENCOUNTER — Telehealth: Payer: Self-pay

## 2024-08-27 NOTE — Telephone Encounter (Signed)
 Pt LM asking for  F2F notes to be sent to a medical supply store but her phone kept cutting in and out, need to call back and find out where they need to go

## 2024-08-29 ENCOUNTER — Telehealth: Payer: Self-pay

## 2024-08-29 NOTE — Telephone Encounter (Signed)
 Left VM to return call

## 2024-08-29 NOTE — Telephone Encounter (Signed)
 Alexandra Montes spoke with the patient and she still could not give the name of the medical equipment place that it needs to be sent to

## 2024-08-29 NOTE — Telephone Encounter (Signed)
 Need to callback to see which medical supply location. See other note.

## 2024-08-29 NOTE — Telephone Encounter (Signed)
 Pt left VM asking is fax was sent to medical supply.

## 2024-08-30 ENCOUNTER — Other Ambulatory Visit: Payer: Self-pay | Admitting: Cardiology

## 2024-08-30 NOTE — Telephone Encounter (Signed)
[facility]

## 2024-09-04 NOTE — Telephone Encounter (Signed)
Pt does not want referral.

## 2024-09-26 ENCOUNTER — Other Ambulatory Visit: Payer: Self-pay | Admitting: Cardiology

## 2024-10-07 ENCOUNTER — Ambulatory Visit: Admitting: Cardiology

## 2024-10-29 ENCOUNTER — Other Ambulatory Visit: Payer: Self-pay

## 2024-10-30 MED ORDER — BACLOFEN 10 MG PO TABS: 10.0000 mg | ORAL_TABLET | Freq: Every day | ORAL | 0 refills | Status: DC

## 2024-10-31 ENCOUNTER — Ambulatory Visit (INDEPENDENT_AMBULATORY_CARE_PROVIDER_SITE_OTHER): Admitting: Cardiology

## 2024-10-31 ENCOUNTER — Encounter: Payer: Self-pay | Admitting: Cardiology

## 2024-10-31 VITALS — BP 114/84 | HR 115 | Ht 59.0 in | Wt 219.6 lb

## 2024-10-31 DIAGNOSIS — Z131 Encounter for screening for diabetes mellitus: Secondary | ICD-10-CM

## 2024-10-31 DIAGNOSIS — I10 Essential (primary) hypertension: Secondary | ICD-10-CM

## 2024-10-31 DIAGNOSIS — Z1329 Encounter for screening for other suspected endocrine disorder: Secondary | ICD-10-CM | POA: Diagnosis not present

## 2024-10-31 DIAGNOSIS — E782 Mixed hyperlipidemia: Secondary | ICD-10-CM | POA: Diagnosis not present

## 2024-10-31 DIAGNOSIS — Z1211 Encounter for screening for malignant neoplasm of colon: Secondary | ICD-10-CM | POA: Diagnosis not present

## 2024-10-31 DIAGNOSIS — Z23 Encounter for immunization: Secondary | ICD-10-CM

## 2024-10-31 NOTE — Progress Notes (Signed)
 Established Patient Office Visit  Subjective:  Patient ID: Alexandra Montes, female    DOB: 28-Apr-1976  Age: 48 y.o. MRN: [id number]  Chief Complaint  Patient presents with   Follow-up    4 month follow up    Patient in office for 4 month follow up, discuss recent lab results. Patient doing well, no new complaints. Continues to see neurology for leg pain.  Did not have blood work done, will do today.  Patient requesting the flu vaccine.  Due for colon cancer screening, will send referral.  Due for pap smear, will schedule at next visit. Blood pressure controlled today.  Continue current medications.     No other concerns at this time.   Past Medical History:  Diagnosis Date   Anxiety    Carpal tunnel syndrome    Cauliflower ear, right    DVT of deep femoral vein, left (HCC)    GERD (gastroesophageal reflux disease)    Headache    Hypertension    Ovarian cyst    Right    Past Surgical History:  Procedure Laterality Date   AMPUTATION FINGER     AMPUTATION TOE     NO PAST SURGERIES      Social History   Socioeconomic History   Marital status: Divorced    Spouse name: Not on file   Number of children: 0   Years of education: Not on file   Highest education level: GED or equivalent  Occupational History   Not on file  Tobacco Use   Smoking status: Never   Smokeless tobacco: Never  Vaping Use   Vaping status: Never Used  Substance and Sexual Activity   Alcohol use: No   Drug use: No   Sexual activity: Yes    Birth control/protection: None  Other Topics Concern   Not on file  Social History Narrative   Not on file   Social Drivers of Health   Tobacco Use: Low Risk (10/31/2024)   Patient History    Smoking Tobacco Use: Never    Smokeless Tobacco Use: Never    Passive Exposure: Not on file  Financial Resource Strain: High Risk (03/27/2024)   Received from [facility]   Overall Financial Resource Strain (CARDIA)    Difficulty of  Paying Living Expenses: Very hard  Food Insecurity: Food Insecurity Present (03/27/2024)   Received from [facility]   Epic    Within the past 12 months, you worried that your food would run out before you got the money to buy more.: Sometimes true    Within the past 12 months, the food you bought just didn't last and you didn't have money to get more.: Sometimes true  Transportation Needs: No Transportation Needs (03/27/2024)   Received from [facility] - Transportation    In the past 12 months, has lack of transportation kept you from medical appointments or from getting medications?: No    Lack of Transportation (Non-Medical): No  Physical Activity: Not on file  Stress: Not on file  Social Connections: Moderately Integrated (03/11/2024)   Social Connection and Isolation Panel    Frequency of Communication with Friends and Family: More than three times a week    Frequency of Social Gatherings with Friends and Family: More than three times a week    Attends Religious Services: 1 to 4 times per year    Active Member of [employer] or Organizations: Yes  Attends Banker Meetings: 1 to 4 times per year    Marital Status: Separated  Recent Concern: Social Connections - Socially Isolated (02/29/2024)   Social Connection and Isolation Panel    Frequency of Communication with Friends and Family: More than three times a week    Frequency of Social Gatherings with Friends and Family: More than three times a week    Attends Religious Services: Never    Database Administrator or Organizations: No    Attends Banker Meetings: Never    Marital Status: Separated  Intimate Partner Violence: Not At Risk (03/11/2024)   Humiliation, Afraid, Rape, and Kick questionnaire    Fear of Current or Ex-Partner: No    Emotionally Abused: No    Physically Abused: No    Sexually Abused: No  Depression (PHQ2-9): Not on file  Alcohol Screen: Not on  file  Housing: High Risk (03/27/2024)   Received from [facility]   Epic    In the last 12 months, was there a time when you were not able to pay the mortgage or rent on time?: Yes    In the past 12 months, how many times have you moved where you were living?: 1    At any time in the past 12 months, were you homeless or living in a shelter (including now)?: No  Utilities: Not At Risk (03/27/2024)   Received from [facility] Utilities    Threatened with loss of utilities: No  Health Literacy: Not on file    Family History  Problem Relation Age of Onset   Heart disease Mother    Bipolar disorder Mother    Drug abuse Mother    Alcohol abuse Mother    Breast cancer Mother 37   Schizophrenia Father    Lung cancer Maternal Grandfather     Allergies[1]  Show/hide medication list[2]  Review of Systems  Constitutional: Negative.   HENT: Negative.    Eyes: Negative.   Respiratory: Negative.  Negative for shortness of breath.   Cardiovascular: Negative.  Negative for chest pain.  Gastrointestinal: Negative.  Negative for abdominal pain, constipation and diarrhea.  Genitourinary: Negative.   Musculoskeletal:  Negative for joint pain and myalgias.  Skin: Negative.   Neurological: Negative.  Negative for dizziness and headaches.  Endo/Heme/Allergies: Negative.   All other systems reviewed and are negative.      Objective:   BP 114/84   Pulse (!) 115   Ht 4' 11 (1.499 m)   Wt 219 lb 9.6 oz (99.6 kg)   SpO2 99%   BMI 44.35 kg/m   Vitals:   10/31/24 1106  BP: 114/84  Pulse: (!) 115  Height: 4' 11 (1.499 m)  Weight: 219 lb 9.6 oz (99.6 kg)  SpO2: 99%  BMI (Calculated): 44.33    Physical Exam Vitals and nursing note reviewed.  Constitutional:      Appearance: Normal appearance. She is normal weight.  HENT:     Head: Normocephalic and atraumatic.     Nose: Nose normal.     Mouth/Throat:     Mouth: Mucous membranes are  moist.  Eyes:     Extraocular Movements: Extraocular movements intact.     Conjunctiva/sclera: Conjunctivae normal.     Pupils: Pupils are equal, round, and reactive to light.  Cardiovascular:     Rate and Rhythm: Normal rate and regular rhythm.     Pulses: Normal pulses.  Heart sounds: Normal heart sounds.  Pulmonary:     Effort: Pulmonary effort is normal.     Breath sounds: Normal breath sounds.  Abdominal:     General: Abdomen is flat. Bowel sounds are normal.     Palpations: Abdomen is soft.  Musculoskeletal:        General: Normal range of motion.     Cervical back: Normal range of motion.  Skin:    General: Skin is warm and dry.  Neurological:     General: No focal deficit present.     Mental Status: She is alert and oriented to person, place, and time.  Psychiatric:        Mood and Affect: Mood normal.        Behavior: Behavior normal.        Thought Content: Thought content normal.        Judgment: Judgment normal.      No results found for any visits on 10/31/24.  No results found for this or any previous visit (from the past 2160 hours).    Assessment & Plan:  Blood work today Flu vaccine today Referral sent for colonoscopy Pap smear at next visit Continue current medications  Problem List Items Addressed This Visit       Cardiovascular and Mediastinum   Essential hypertension - Primary   Relevant Orders   CMP14+EGFR     Other   Mixed hyperlipidemia   Relevant Orders   Lipid Profile   Other Visit Diagnoses       Flu vaccine need       Relevant Orders   Influenza, MDCK, trivalent, PF(Flucelvax egg-free) (Completed)     Diabetes mellitus screening       Relevant Orders   Hemoglobin A1c     Thyroid disorder screening       Relevant Orders   TSH     Colon cancer screening       Relevant Orders   Ambulatory referral to Gastroenterology       Return in about 4 months (around 03/01/2025) for with NK for pap, fasting lab work prior.    Total time spent: 25 minutes. This time includes review of previous notes and results and patient face to face interaction during today's visit.    Jeoffrey Pollen, NP  10/31/2024   This document may have been prepared by Dragon Voice Recognition software and as such may include unintentional dictation errors.     [1] No Known Allergies [2]  Outpatient Medications Prior to Visit  Medication Sig   acetaminophen  (TYLENOL ) 325 MG tablet Take 650 mg by mouth 4 (four) times daily.   albuterol  (VENTOLIN  HFA) 108 (90 Base) MCG/ACT inhaler ProAir  HFA 108 (90 Base) MCG/ACT Inhalation Aerosol Solution QTY: 1 inhaler Days: 30 Refills: 1  Written: 02/27/18 Patient Instructions: Inhale 1 puff orally every 6 hours as needed for shortness of breath   ASPIRIN  LOW DOSE 81 MG chewable tablet Chew 1 tablet (81 mg total) by mouth daily.   baclofen  (LIORESAL ) 10 MG tablet Take 1 tablet (10 mg total) by mouth daily.   butalbital -acetaminophen -caffeine  (FIORICET ) 50-325-40 MG tablet Take 1 tablet by mouth every 6 (six) hours as needed for headache.   citalopram  (CELEXA ) 10 MG tablet Take 1 tablet (10 mg total) by mouth daily.   ergocalciferol  (VITAMIN D2) 1.25 MG (50000 UT) capsule Ergocalciferol  1.25 MG (50000 UT) Oral Capsule QTY: 12 capsule Days: 84 Refills: 3  Written: 03/14/19 Patient Instructions: Take 1 capsule by  mouth WEEKLY   gabapentin  (NEURONTIN ) 300 MG capsule Take 100 mg by mouth 2 (two) times daily as needed (Nerve Pain). Taking 600 mg every morning, 900 mg at night   loratadine  (CLARITIN ) 10 MG tablet Take 1 tablet (10 mg total) by mouth daily.   nortriptyline (PAMELOR) 10 MG capsule Take 10 mg by mouth.   rosuvastatin  (CRESTOR ) 20 MG tablet Take 1 tablet by mouth once daily   spironolactone  (ALDACTONE ) 25 MG tablet Take 1 tablet (25 mg total) by mouth daily.   SUMAtriptan  (IMITREX ) 50 MG tablet Take 50 mg by mouth 2 (two) times daily as needed.   triamcinolone (KENALOG) 0.025 % cream Apply 1  Application topically daily as needed (rash).   fluticasone (FLONASE) 50 MCG/ACT nasal spray Fluticasone Propionate 50 MCG/ACT Nasal Suspension QTY: 1 bottle Days: 30 Refills: 4  Written: 02/19/18 Patient Instructions: Lean head forward, instill 1 spray in each nostril daily with a gentle sniff (Patient not taking: Reported on 10/31/2024)   No facility-administered medications prior to visit.

## 2024-11-01 ENCOUNTER — Other Ambulatory Visit: Payer: Self-pay | Admitting: Cardiology

## 2024-11-01 ENCOUNTER — Ambulatory Visit: Payer: Self-pay | Admitting: Cardiology

## 2024-11-01 DIAGNOSIS — R7989 Other specified abnormal findings of blood chemistry: Secondary | ICD-10-CM

## 2024-11-01 LAB — CMP14+EGFR
ALT: 24 IU/L (ref 0–32)
AST: 23 IU/L (ref 0–40)
Albumin: 4.1 g/dL (ref 3.9–4.9)
Alkaline Phosphatase: 106 IU/L (ref 41–116)
BUN/Creatinine Ratio: 20 (ref 9–23)
BUN: 16 mg/dL (ref 6–24)
Bilirubin Total: 0.6 mg/dL (ref 0.0–1.2)
CO2: 26 mmol/L (ref 20–29)
Calcium: 9.5 mg/dL (ref 8.7–10.2)
Chloride: 101 mmol/L (ref 96–106)
Creatinine, Ser: 0.81 mg/dL (ref 0.57–1.00)
Globulin, Total: 3.4 g/dL (ref 1.5–4.5)
Glucose: 109 mg/dL — ABNORMAL HIGH (ref 70–99)
Potassium: 4.2 mmol/L (ref 3.5–5.2)
Sodium: 140 mmol/L (ref 134–144)
Total Protein: 7.5 g/dL (ref 6.0–8.5)
eGFR: 88 mL/min/1.73 (ref >59)

## 2024-11-01 LAB — LIPID PANEL
Chol/HDL Ratio: 3.2 ratio (ref 0.0–4.4)
Cholesterol, Total: 129 mg/dL (ref 100–199)
HDL: 40 mg/dL (ref >39)
LDL Chol Calc (NIH): 74 mg/dL (ref 0–99)
Triglycerides: 74 mg/dL (ref 0–149)
VLDL Cholesterol Cal: 15 mg/dL (ref 5–40)

## 2024-11-01 LAB — HEMOGLOBIN A1C
Est. average glucose Bld gHb Est-mCnc: 94 mg/dL
Hgb A1c MFr Bld: 4.9 % (ref 4.8–5.6)

## 2024-11-01 LAB — TSH: TSH: 5.14 u[IU]/mL — ABNORMAL HIGH (ref 0.450–4.500)

## 2024-11-01 NOTE — Progress Notes (Signed)
 Left a VM

## 2024-11-04 ENCOUNTER — Other Ambulatory Visit: Payer: Self-pay

## 2024-11-04 DIAGNOSIS — F4310 Post-traumatic stress disorder, unspecified: Secondary | ICD-10-CM

## 2024-11-04 MED ORDER — CITALOPRAM HYDROBROMIDE 10 MG PO TABS: 10.0000 mg | ORAL_TABLET | Freq: Every day | ORAL | 1 refills | Status: AC

## 2024-11-28 ENCOUNTER — Other Ambulatory Visit: Payer: Self-pay | Admitting: Cardiology

## 2024-12-10 ENCOUNTER — Other Ambulatory Visit: Payer: Self-pay

## 2024-12-10 MED ORDER — ROSUVASTATIN CALCIUM 20 MG PO TABS: 20.0000 mg | ORAL_TABLET | Freq: Every day | ORAL | 0 refills | Status: AC

## 2024-12-26 ENCOUNTER — Other Ambulatory Visit: Payer: Self-pay | Admitting: Neurology

## 2024-12-26 DIAGNOSIS — G8929 Other chronic pain: Secondary | ICD-10-CM

## 2024-12-26 DIAGNOSIS — M79604 Pain in right leg: Secondary | ICD-10-CM

## 2025-03-03 ENCOUNTER — Encounter: Admitting: Internal Medicine
